# Patient Record
Sex: Female | Born: 1990
Health system: Southern US, Community
[De-identification: ages and names within clinical notes are randomized; demographics above are authoritative.]

## PROBLEM LIST (undated history)

## (undated) ENCOUNTER — Inpatient Hospital Stay (HOSPITAL_COMMUNITY): Payer: Self-pay

## (undated) DIAGNOSIS — D649 Anemia, unspecified: Secondary | ICD-10-CM

## (undated) DIAGNOSIS — I82409 Acute embolism and thrombosis of unspecified deep veins of unspecified lower extremity: Secondary | ICD-10-CM

## (undated) HISTORY — PX: NO PAST SURGERIES: SHX2092

## (undated) HISTORY — DX: Anemia, unspecified: D64.9

## (undated) NOTE — *Deleted (*Deleted)
Meet the Provider Zoom Sessions      [facility] for [facility] is now offering FREE monthly 1-hour virtual Zoom sessions for new, current, and prospective patients.        During these sessions, you can:   Learn about our practice, model of care, services   Get answers to questions about pregnancy and birth during COVID   Pick your provider's brain about anything else!    Sessions will be hosted by [employer] for [employer], Producer, television/film/video, Physicians and Midwives          No registration required      2021 Dates:      All at 6pm     October 21st     November 18th   December 16th     January 20th  February 17th    To join one of these meetings, a few minutes before it is set to start:     Copy/paste the link into your web browser:  [email or web address]    OR  Scan the QR code below (open up your camera and point towards QR code; click on tab that pops up on your phone ("zoom")     Labor Precautions Reasons to come to [facility] at [facility]:  1.  Contractions are  5 minutes apart or less, each last 1 minute, these have been going on for 1-2 hours, and you cannot walk or talk during them 2.  You have a large gush of fluid, or a trickle of fluid that will not stop and you have to wear a pad 3.  You have bleeding that is bright red, heavier than spotting--like menstrual bleeding (spotting can be normal in early labor or after a check of your cervix) 4.  You do not feel the baby moving like he/she normally does

---

## 2010-08-19 ENCOUNTER — Inpatient Hospital Stay (HOSPITAL_COMMUNITY): Admission: AD | Admit: 2010-08-19 | Discharge: 2010-08-19 | Payer: Self-pay | Admitting: Obstetrics & Gynecology

## 2010-08-19 ENCOUNTER — Ambulatory Visit: Payer: Self-pay | Admitting: Gynecology

## 2010-11-11 ENCOUNTER — Ambulatory Visit (HOSPITAL_COMMUNITY): Admission: RE | Admit: 2010-11-11 | Discharge: 2010-11-11 | Payer: Self-pay | Admitting: Obstetrics and Gynecology

## 2011-01-11 ENCOUNTER — Inpatient Hospital Stay (HOSPITAL_COMMUNITY)
Admission: AD | Admit: 2011-01-11 | Discharge: 2011-01-11 | Payer: Self-pay | Source: Home / Self Care | Attending: Obstetrics and Gynecology | Admitting: Obstetrics and Gynecology

## 2011-01-13 LAB — URINALYSIS, ROUTINE W REFLEX MICROSCOPIC
Bilirubin Urine: NEGATIVE
Hgb urine dipstick: NEGATIVE
Ketones, ur: NEGATIVE mg/dL
Nitrite: NEGATIVE
Protein, ur: NEGATIVE mg/dL
Specific Gravity, Urine: 1.02 (ref 1.005–1.030)
Urine Glucose, Fasting: NEGATIVE mg/dL
Urobilinogen, UA: 0.2 mg/dL (ref 0.0–1.0)
pH: 7.5 (ref 5.0–8.0)

## 2011-02-11 ENCOUNTER — Inpatient Hospital Stay (HOSPITAL_COMMUNITY)
Admission: AD | Admit: 2011-02-11 | Discharge: 2011-02-11 | Disposition: A | Discharge status: Home / Self Care | Payer: Medicaid Other | Source: Ambulatory Visit | Attending: Obstetrics and Gynecology | Admitting: Obstetrics and Gynecology

## 2011-02-11 DIAGNOSIS — R109 Unspecified abdominal pain: Secondary | ICD-10-CM

## 2011-02-11 DIAGNOSIS — A499 Bacterial infection, unspecified: Secondary | ICD-10-CM | POA: Insufficient documentation

## 2011-02-11 DIAGNOSIS — B9689 Other specified bacterial agents as the cause of diseases classified elsewhere: Secondary | ICD-10-CM

## 2011-02-11 DIAGNOSIS — N760 Acute vaginitis: Secondary | ICD-10-CM | POA: Insufficient documentation

## 2011-02-11 DIAGNOSIS — O2390 Unspecified genitourinary tract infection in pregnancy, unspecified trimester: Secondary | ICD-10-CM

## 2011-02-11 LAB — URINALYSIS, ROUTINE W REFLEX MICROSCOPIC
Bilirubin Urine: NEGATIVE
Hgb urine dipstick: NEGATIVE
Ketones, ur: 15 mg/dL — AB
Nitrite: NEGATIVE
Protein, ur: NEGATIVE mg/dL
Specific Gravity, Urine: 1.01 (ref 1.005–1.030)
Urine Glucose, Fasting: NEGATIVE mg/dL
Urobilinogen, UA: 0.2 mg/dL (ref 0.0–1.0)
pH: 6.5 (ref 5.0–8.0)

## 2011-02-11 LAB — WET PREP, GENITAL
Trich, Wet Prep: NONE SEEN
Yeast Wet Prep HPF POC: NONE SEEN

## 2011-02-12 LAB — GC/CHLAMYDIA PROBE AMP, GENITAL
Chlamydia, DNA Probe: NEGATIVE
GC Probe Amp, Genital: NEGATIVE

## 2011-02-13 LAB — STREP B DNA PROBE: Strep Group B Ag: NEGATIVE

## 2011-03-08 ENCOUNTER — Inpatient Hospital Stay (HOSPITAL_COMMUNITY): Payer: Medicaid Other

## 2011-03-08 ENCOUNTER — Inpatient Hospital Stay (HOSPITAL_COMMUNITY)
Admission: AD | Admit: 2011-03-08 | Discharge: 2011-03-08 | Disposition: A | Discharge status: Home / Self Care | Payer: Medicaid Other | Source: Ambulatory Visit | Attending: Family Medicine | Admitting: Family Medicine

## 2011-03-08 DIAGNOSIS — O368390 Maternal care for abnormalities of the fetal heart rate or rhythm, unspecified trimester, not applicable or unspecified: Secondary | ICD-10-CM | POA: Insufficient documentation

## 2011-03-09 ENCOUNTER — Inpatient Hospital Stay (HOSPITAL_COMMUNITY)
Admission: AD | Admit: 2011-03-09 | Discharge: 2011-03-11 | DRG: 775 | Disposition: A | Discharge status: Home / Self Care | Payer: Medicaid Other | Source: Ambulatory Visit | Attending: Obstetrics & Gynecology | Admitting: Obstetrics & Gynecology

## 2011-03-10 LAB — CBC
HCT: 36 % (ref 36.0–46.0)
Hemoglobin: 11.9 g/dL — ABNORMAL LOW (ref 12.0–15.0)
MCH: 28.7 pg (ref 26.0–34.0)
MCHC: 33.1 g/dL (ref 30.0–36.0)
MCV: 87 fL (ref 78.0–100.0)
Platelets: 167 10*3/uL (ref 150–400)
RBC: 4.14 MIL/uL (ref 3.87–5.11)
RDW: 13.6 % (ref 11.5–15.5)
WBC: 5.7 10*3/uL (ref 4.0–10.5)

## 2011-03-10 LAB — RPR: RPR Ser Ql: NONREACTIVE

## 2011-03-12 ENCOUNTER — Other Ambulatory Visit: Payer: Medicaid Other

## 2011-03-12 LAB — URINALYSIS, ROUTINE W REFLEX MICROSCOPIC
Glucose, UA: NEGATIVE mg/dL
Ketones, ur: >80 mg/dL — AB
Leukocytes, UA: NEGATIVE
Nitrite: NEGATIVE
Protein, ur: 30 mg/dL — AB
Specific Gravity, Urine: >1.03 — ABNORMAL HIGH (ref 1.005–1.030)
Urobilinogen, UA: 1 mg/dL (ref 0.0–1.0)
pH: 6 (ref 5.0–8.0)

## 2011-03-12 LAB — GC/CHLAMYDIA PROBE AMP, GENITAL
Chlamydia, DNA Probe: NEGATIVE
GC Probe Amp, Genital: POSITIVE — AB

## 2011-03-12 LAB — URINE MICROSCOPIC-ADD ON

## 2011-03-12 LAB — WET PREP, GENITAL
Trich, Wet Prep: NONE SEEN
Yeast Wet Prep HPF POC: NONE SEEN

## 2011-03-12 LAB — POCT PREGNANCY, URINE: Preg Test, Ur: POSITIVE

## 2012-11-10 ENCOUNTER — Emergency Department (HOSPITAL_COMMUNITY): Payer: Self-pay

## 2012-11-10 ENCOUNTER — Emergency Department (HOSPITAL_COMMUNITY)
Admission: EM | Admit: 2012-11-10 | Discharge: 2012-11-10 | Disposition: A | Discharge status: Home / Self Care | Payer: No Typology Code available for payment source | Attending: Emergency Medicine | Admitting: Emergency Medicine

## 2012-11-10 ENCOUNTER — Encounter (HOSPITAL_COMMUNITY): Payer: Self-pay

## 2012-11-10 DIAGNOSIS — F17200 Nicotine dependence, unspecified, uncomplicated: Secondary | ICD-10-CM | POA: Insufficient documentation

## 2012-11-10 DIAGNOSIS — M542 Cervicalgia: Secondary | ICD-10-CM | POA: Insufficient documentation

## 2012-11-10 DIAGNOSIS — IMO0002 Reserved for concepts with insufficient information to code with codable children: Secondary | ICD-10-CM | POA: Insufficient documentation

## 2012-11-10 DIAGNOSIS — S39012A Strain of muscle, fascia and tendon of lower back, initial encounter: Secondary | ICD-10-CM

## 2012-11-10 DIAGNOSIS — Y929 Unspecified place or not applicable: Secondary | ICD-10-CM | POA: Insufficient documentation

## 2012-11-10 DIAGNOSIS — Y939 Activity, unspecified: Secondary | ICD-10-CM | POA: Insufficient documentation

## 2012-11-10 MED ORDER — DIAZEPAM 5 MG PO TABS: 5.0000 mg | ORAL_TABLET | Freq: Four times a day (QID) | ORAL | Status: DC | PRN

## 2012-11-10 MED ORDER — IBUPROFEN 600 MG PO TABS: 600.0000 mg | ORAL_TABLET | Freq: Four times a day (QID) | ORAL | Status: DC | PRN

## 2012-11-10 MED ORDER — OXYCODONE-ACETAMINOPHEN 5-325 MG PO TABS: 1.0000 | ORAL_TABLET | Freq: Four times a day (QID) | ORAL | Status: DC | PRN

## 2012-11-10 NOTE — ED Notes (Signed)
Pt arrived via [facility] on LSB and c-collar intact 
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
## 2012-11-10 NOTE — ED Notes (Signed)
Myself and Ginnie, RN assisted Pickering, MD with the removal of LSB; Pickering, MD removed c-collar; pt sitting up in bed awaiting tests to be performed

## 2013-11-17 ENCOUNTER — Encounter (HOSPITAL_COMMUNITY): Payer: Self-pay | Admitting: Emergency Medicine

## 2013-11-17 ENCOUNTER — Emergency Department (HOSPITAL_COMMUNITY)
Admission: EM | Admit: 2013-11-17 | Discharge: 2013-11-18 | Disposition: A | Discharge status: Home / Self Care | Payer: Medicaid Other | Attending: Emergency Medicine | Admitting: Emergency Medicine

## 2013-11-17 DIAGNOSIS — R0989 Other specified symptoms and signs involving the circulatory and respiratory systems: Secondary | ICD-10-CM | POA: Insufficient documentation

## 2013-11-17 DIAGNOSIS — R0602 Shortness of breath: Secondary | ICD-10-CM | POA: Insufficient documentation

## 2013-11-17 DIAGNOSIS — T7840XA Allergy, unspecified, initial encounter: Secondary | ICD-10-CM

## 2013-11-17 DIAGNOSIS — R21 Rash and other nonspecific skin eruption: Secondary | ICD-10-CM | POA: Insufficient documentation

## 2013-11-17 DIAGNOSIS — Y929 Unspecified place or not applicable: Secondary | ICD-10-CM | POA: Insufficient documentation

## 2013-11-17 DIAGNOSIS — R1310 Dysphagia, unspecified: Secondary | ICD-10-CM | POA: Insufficient documentation

## 2013-11-17 DIAGNOSIS — R0609 Other forms of dyspnea: Secondary | ICD-10-CM | POA: Insufficient documentation

## 2013-11-17 DIAGNOSIS — R0789 Other chest pain: Secondary | ICD-10-CM | POA: Insufficient documentation

## 2013-11-17 DIAGNOSIS — T61771A Other fish poisoning, accidental (unintentional), initial encounter: Secondary | ICD-10-CM | POA: Insufficient documentation

## 2013-11-17 DIAGNOSIS — F17200 Nicotine dependence, unspecified, uncomplicated: Secondary | ICD-10-CM | POA: Insufficient documentation

## 2013-11-17 DIAGNOSIS — Y939 Activity, unspecified: Secondary | ICD-10-CM | POA: Insufficient documentation

## 2013-11-17 MED ORDER — DIPHENHYDRAMINE HCL 50 MG/ML IJ SOLN
25.0000 mg | Freq: Once | INTRAMUSCULAR | Status: AC
  Administered 2013-11-17: 25 mg via INTRAVENOUS
  Filled 2013-11-17: qty 1

## 2013-11-17 MED ORDER — SODIUM CHLORIDE 0.9 % IV BOLUS (SEPSIS)
1000.0000 mL | Freq: Once | INTRAVENOUS | Status: AC
  Administered 2013-11-17: 1000 mL via INTRAVENOUS

## 2013-11-17 MED ORDER — FAMOTIDINE IN NACL 20-0.9 MG/50ML-% IV SOLN
20.0000 mg | Freq: Once | INTRAVENOUS | Status: AC
  Administered 2013-11-17: 20 mg via INTRAVENOUS
  Filled 2013-11-17: qty 50

## 2013-11-17 NOTE — ED Provider Notes (Signed)
CSN: [id number]     Arrival date & time 11/17/13  2202 History  This chart was scribed for non-physician practitioner Antony Madura, PA-C, working with Olivia Mackie, MD by Dorothey Baseman, ED Scribe. This patient was seen in room B15C/B15C and the patient's care was started at 11:17 PM.    Chief Complaint  Patient presents with  . Allergic Reaction   Patient is a 22 y.o. female presenting with allergic reaction. The history is provided by the patient. No language interpreter was used.  Allergic Reaction Presenting symptoms: difficulty breathing, difficulty swallowing and rash   Presenting symptoms: no wheezing   Severity:  Moderate Prior allergic episodes:  Seasonal allergies and food/nut allergies Context: food   Relieved by:  None tried Ineffective treatments:  None tried  HPI Comments: Claudia Miller is a 22 y.o. female who presents to the Emergency Department complaining of an allergic reaction onset PTA when she states that she ate a microwaved eggroll containing shrimp around 3 hours ago. Patient reports that she has eaten shrimp before without an allergic reaction. She reports associated throat tightness/scratchiness, shortness of breath that has been improving, difficulty swallowing, chest tightness, and an urticarial rash. She denies taking any medications at home to treat her symptoms. Patient denies drooling, wheezes, loss of consciousness, or facial swelling. She reports an allergy to fish and seasonal allergies.   History reviewed. No pertinent past medical history. History reviewed. No pertinent past surgical history. Family History  Problem Relation Age of Onset  . Asthma Mother   . Diabetes Mother   . Hypertension Mother   . Cancer Father   . Hypertension Father   . Asthma Sister   . Hypertension Sister   . Asthma Brother   . Hypertension Brother    History  Substance Use Topics  . Smoking status: Current Every Day Smoker -- 0.50 packs/day    Types: Cigarettes  .  Smokeless tobacco: Not on file  . Alcohol Use: No   OB History   Grav Para Term Preterm Abortions TAB SAB Ect Mult Living                 Review of Systems  HENT: Positive for trouble swallowing. Negative for drooling and facial swelling.   Respiratory: Positive for chest tightness and shortness of breath. Negative for wheezing.   Skin: Positive for rash.  Allergic/Immunologic: Positive for food allergies.  Neurological: Negative for syncope.    Allergies  Fish allergy  Home Medications   Current Outpatient Rx  Name  Route  Sig  Dispense  Refill  . aspirin-acetaminophen-caffeine (EXCEDRIN MIGRAINE) 250-250-65 MG per tablet   Oral   Take 1 tablet by mouth every 6 (six) hours as needed for headache or migraine.          Triage Vitals: BP 96/52  Pulse 78  Temp(Src) 98.2 F (36.8 C)  Resp 18  SpO2 99%  LMP 10/27/2013  Physical Exam  Nursing note and vitals reviewed. Constitutional: She is oriented to person, place, and time. She appears well-developed and well-nourished. No distress.  HENT:  Head: Normocephalic and atraumatic.  No angioedema. No drooling. Patient is tolerating secretions well.   Eyes: Conjunctivae and EOM are normal. No scleral icterus.  Neck: Normal range of motion.  Cardiovascular: Normal rate, regular rhythm and normal heart sounds.  Exam reveals no gallop and no friction rub.   No murmur heard. Not tachycardiac.   Pulmonary/Chest: Effort normal and breath sounds normal. No stridor. No respiratory  distress. She has no wheezes. She has no rales.  Lungs clear to auscultation bilaterally.  Musculoskeletal: Normal range of motion.  Neurological: She is alert and oriented to person, place, and time.  Skin: Skin is warm and dry. No rash noted. She is not diaphoretic. No erythema. No pallor.  Psychiatric: She has a normal mood and affect. Her behavior is normal.    ED Course  Procedures (including critical care time)  DIAGNOSTIC STUDIES: Oxygen  Saturation is 99% on room air, normal by my interpretation.    COORDINATION OF CARE: 11:26 PM- Will order Benadryl, Pepcid, and a sodium chloride bolus. Discussed treatment plan with patient at bedside and patient verbalized agreement.   Labs Review Labs Reviewed - No data to display Imaging Review No results found.  EKG Interpretation   None       MDM   1. Allergic reaction, initial encounter    22 year old female with a history of fish allergy presents for difficulty swallowing and a sensation that her throat was closing up after eating an eggroll this evening. Patient well and nontoxic appearing on arrival, hemodynamically stable, and afebrile. On initial presentation patient without tachycardia, tachypnea, dyspnea, or hypoxia. No angioedema or stridor noted on physical exam. Lungs CTAB. Patient treated in emergency department with Benadryl, Pepcid, and IV fluids with continued improvement in her symptoms. As patient has remained hemodynamically stable without stridor, angioedema, tachypnea, or hypoxia for at least 5 hours after food ingestion, believe her to be stable for discharge with primary care followup as needed. Patient given referral to allergist and instructed to refrain from eating seafood. Return precautions discussed and patient agreeable to plan with no unaddressed concerns.  I personally performed the services described in this documentation, which was scribed in my presence. The recorded information has been reviewed and is accurate.      Antony Madura, PA-C 11/18/13 [phone]

## 2013-11-17 NOTE — ED Notes (Signed)
Pt states she ate some shrimp, and now she feels like her throat is closing up.

## 2013-11-18 NOTE — ED Provider Notes (Signed)
Medical screening examination/treatment/procedure(s) were performed by non-physician practitioner and as supervising physician I was immediately available for consultation/collaboration.    Olivia Mackie, MD 11/18/13 [phone]

## 2013-12-01 ENCOUNTER — Encounter (HOSPITAL_COMMUNITY): Payer: Self-pay | Admitting: Emergency Medicine

## 2013-12-01 ENCOUNTER — Emergency Department (HOSPITAL_COMMUNITY)
Admission: EM | Admit: 2013-12-01 | Discharge: 2013-12-01 | Disposition: A | Discharge status: Home / Self Care | Payer: Medicaid Other | Attending: Emergency Medicine | Admitting: Emergency Medicine

## 2013-12-01 ENCOUNTER — Emergency Department (HOSPITAL_COMMUNITY)
Admission: EM | Admit: 2013-12-01 | Discharge: 2013-12-01 | Disposition: A | Discharge status: Nursing Home (Includes ICF) | Payer: Medicaid Other | Source: Home / Self Care | Attending: Emergency Medicine | Admitting: Emergency Medicine

## 2013-12-01 DIAGNOSIS — Z3490 Encounter for supervision of normal pregnancy, unspecified, unspecified trimester: Secondary | ICD-10-CM

## 2013-12-01 DIAGNOSIS — R2990 Unspecified symptoms and signs involving the nervous system: Secondary | ICD-10-CM

## 2013-12-01 DIAGNOSIS — F17200 Nicotine dependence, unspecified, uncomplicated: Secondary | ICD-10-CM | POA: Insufficient documentation

## 2013-12-01 DIAGNOSIS — R29818 Other symptoms and signs involving the nervous system: Secondary | ICD-10-CM

## 2013-12-01 DIAGNOSIS — G43909 Migraine, unspecified, not intractable, without status migrainosus: Secondary | ICD-10-CM | POA: Insufficient documentation

## 2013-12-01 DIAGNOSIS — R51 Headache: Secondary | ICD-10-CM

## 2013-12-01 DIAGNOSIS — O219 Vomiting of pregnancy, unspecified: Secondary | ICD-10-CM | POA: Insufficient documentation

## 2013-12-01 DIAGNOSIS — R197 Diarrhea, unspecified: Secondary | ICD-10-CM | POA: Insufficient documentation

## 2013-12-01 DIAGNOSIS — Z79899 Other long term (current) drug therapy: Secondary | ICD-10-CM | POA: Insufficient documentation

## 2013-12-01 DIAGNOSIS — Z3201 Encounter for pregnancy test, result positive: Secondary | ICD-10-CM | POA: Insufficient documentation

## 2013-12-01 DIAGNOSIS — R112 Nausea with vomiting, unspecified: Secondary | ICD-10-CM

## 2013-12-01 DIAGNOSIS — R519 Headache, unspecified: Secondary | ICD-10-CM

## 2013-12-01 LAB — URINALYSIS, ROUTINE W REFLEX MICROSCOPIC
Bilirubin Urine: NEGATIVE
Glucose, UA: NEGATIVE mg/dL
Ketones, ur: NEGATIVE mg/dL
Nitrite: NEGATIVE
Protein, ur: NEGATIVE mg/dL
Specific Gravity, Urine: 1.015 (ref 1.005–1.030)
Urobilinogen, UA: 0.2 mg/dL (ref 0.0–1.0)
pH: 6.5 (ref 5.0–8.0)

## 2013-12-01 LAB — URINE MICROSCOPIC-ADD ON

## 2013-12-01 MED ORDER — METOCLOPRAMIDE HCL 5 MG/ML IJ SOLN
10.0000 mg | Freq: Once | INTRAMUSCULAR | Status: AC
  Administered 2013-12-01: 10 mg via INTRAVENOUS
  Filled 2013-12-01: qty 2

## 2013-12-01 MED ORDER — NITROFURANTOIN MONOHYD MACRO 100 MG PO CAPS: 100.0000 mg | ORAL_CAPSULE | Freq: Two times a day (BID) | ORAL | Status: DC

## 2013-12-01 MED ORDER — DIPHENHYDRAMINE HCL 50 MG/ML IJ SOLN
25.0000 mg | Freq: Once | INTRAMUSCULAR | Status: AC
  Administered 2013-12-01: 25 mg via INTRAVENOUS
  Filled 2013-12-01: qty 1

## 2013-12-01 MED ORDER — ONDANSETRON 4 MG PO TBDP
ORAL_TABLET | ORAL | Status: AC
  Filled 2013-12-01: qty 2

## 2013-12-01 MED ORDER — ONDANSETRON 4 MG PO TBDP: 4.0000 mg | ORAL_TABLET | Freq: Three times a day (TID) | ORAL | Status: DC | PRN

## 2013-12-01 MED ORDER — ONDANSETRON 4 MG PO TBDP
8.0000 mg | ORAL_TABLET | Freq: Once | ORAL | Status: AC
  Administered 2013-12-01: 8 mg via ORAL

## 2013-12-01 MED ORDER — DEXAMETHASONE SODIUM PHOSPHATE 10 MG/ML IJ SOLN
10.0000 mg | Freq: Once | INTRAMUSCULAR | Status: AC
  Administered 2013-12-01: 10 mg via INTRAVENOUS
  Filled 2013-12-01: qty 1

## 2013-12-01 NOTE — ED Notes (Signed)
Pharmacy tech at bedside 

## 2013-12-01 NOTE — ED Provider Notes (Signed)
CSN: [id number]     Arrival date & time 12/01/13  1213 History   First MD Initiated Contact with Patient 12/01/13 1228     Chief Complaint  Patient presents with  . Nausea  . Diarrhea   (Consider location/radiation/quality/duration/timing/severity/associated sxs/prior Treatment) HPI Comments: Patient presents with a chief complaint of nausea, vomiting, diarrhea, and headache.  She reports that her symptoms have been present for the past 3 days.  She reports three episodes of diarrhea and three episodes of vomiting.  She denies any blood in her emesis or blood in her stool.  She denies abdominal pain.  Denies fever or chills.  Denies urinary symptoms.  She has not taken anything for her symptoms prior to arrival.  She reports that her last menstrual period was approximately one month ago.  She denies vaginal discharge or vaginal bleeding.  Patient also complaining of a frontal headache that has been present intermittently over the past 3 days.  She reports that the headache is gradual in onset.  She has had similar headaches in the past.  She denies any acute head injury or trauma.  Denies neck pain, neck stiffness, fever, chills, or vision changes.    The history is provided by the patient.    History reviewed. No pertinent past medical history. History reviewed. No pertinent past surgical history. Family History  Problem Relation Age of Onset  . Asthma Mother   . Diabetes Mother   . Hypertension Mother   . Cancer Father   . Hypertension Father   . Asthma Sister   . Hypertension Sister   . Asthma Brother   . Hypertension Brother    History  Substance Use Topics  . Smoking status: Current Every Day Smoker -- 0.50 packs/day    Types: Cigarettes  . Smokeless tobacco: Not on file  . Alcohol Use: No   OB History   Grav Para Term Preterm Abortions TAB SAB Ect Mult Living                 Review of Systems  Gastrointestinal: Positive for nausea, vomiting and diarrhea.   Neurological: Positive for headaches.  All other systems reviewed and are negative.    Allergies  Fish allergy  Home Medications   Current Outpatient Rx  Name  Route  Sig  Dispense  Refill  . aspirin-acetaminophen-caffeine (EXCEDRIN MIGRAINE) 250-250-65 MG per tablet   Oral   Take 1 tablet by mouth every 6 (six) hours as needed for headache or migraine.          BP 103/77  Pulse 72  Temp(Src) 98.2 F (36.8 C) (Oral)  Resp 18  Ht 5' (1.524 m)  Wt 180 lb (81.647 kg)  BMI 35.15 kg/m2  SpO2 72%  LMP 11/07/2013 Physical Exam  Nursing note and vitals reviewed. Constitutional: She appears well-developed and well-nourished.  HENT:  Head: Normocephalic and atraumatic.  Mouth/Throat: Oropharynx is clear and moist.  Eyes: EOM are normal. Pupils are equal, round, and reactive to light.  Neck: Normal range of motion. Neck supple.  Cardiovascular: Normal rate, regular rhythm and normal heart sounds.   Pulmonary/Chest: Effort normal and breath sounds normal.  Abdominal: Soft. Bowel sounds are normal. She exhibits no distension and no mass. There is no tenderness. There is no rebound and no guarding.  Musculoskeletal: Normal range of motion.  Neurological: She is alert. She has normal strength. No cranial nerve deficit or sensory deficit. Coordination and gait normal.  Normal finger to nose testing bilaterally  Normal rapid alternating movements Normal gait, no ataxia.    Skin: Skin is warm and dry. No rash noted.  Psychiatric: She has a normal mood and affect.    ED Course  Procedures (including critical care time) Labs Review Labs Reviewed  URINALYSIS, ROUTINE W REFLEX MICROSCOPIC   Imaging Review No results found.  EKG Interpretation   None       MDM  No diagnosis found. Pt HA treated and improved while in ED.  Presentation is like pts typical HA and non concerning for [facility], ICH, Meningitis, or temporal arteritis. Pt is afebrile with no focal neuro deficits,  nuchal rigidity, or change in vision. Pt is to follow up with PCP to discuss prophylactic medication. Pt verbalizes understanding and is agreeable with plan to dc.  Patient also complaining of nausea, vomiting, and diarrhea.  Nausea improved after given Zofran.  Patient able to tolerate PO liquids.  No abdominal pain on exam.  Urine pregnancy positive, but patient does not have any pelvic pain, vaginal discharge, or vaginal bleeding.  Therefore, do not feel that any imaging is indicated at this time.  Patient stable for discharge.  Return precautions given.   Santiago Glad, PA-C 12/02/13 2201

## 2013-12-01 NOTE — ED Notes (Signed)
Pt c/o constant HA onset 3 days Sxs also include: nauseas, diarrhea, dizziness... HA will increases w/bright light and loud noise Denies: colds sxs, f/v Taking Excedrin w/no relief.  Alert w/no signs of acute distress.

## 2013-12-01 NOTE — ED Provider Notes (Signed)
Medical screening examination/treatment/procedure(s) were performed by non-physician practitioner and as supervising physician I was immediately available for consultation/collaboration.  Leslee Home, M.D.  Reuben Likes, MD 12/01/13 [phone]

## 2013-12-01 NOTE — ED Notes (Signed)
Pt. Stated. Ive had N/D and a headache for the last 3 days.

## 2013-12-01 NOTE — ED Notes (Signed)
Pt reports she was just seen at urgent care and was given zofran ODT and it has helped her nausea since then.

## 2013-12-01 NOTE — ED Notes (Signed)
Pt given urine specimen cup and told to provide [place] a urine sample.

## 2013-12-01 NOTE — ED Provider Notes (Signed)
CSN: [id number]     Arrival date & time 12/01/13  1009 History   First MD Initiated Contact with Patient 12/01/13 1136     Chief Complaint  Patient presents with  . Headache   (Consider location/radiation/quality/duration/timing/severity/associated sxs/prior Treatment) HPI Comments: 22 year old female presents complaining of headache, nausea, vomiting, diarrhea, lightheadedness. She also admits to a rash on the right side of her neck. This began 3 days ago with a mild to moderate left-sided headache. Since that time, the headache has gotten progressively worse. It is not consistently in one spot, she states it moves around her left side of her head. She has not had any time without headache in the last 3 days. Initially, the headache did not have any other symptoms but has progressed to photophobia, phonophobia. Starting yesterday, she began to have nausea which progressed to vomiting as well as diarrhea. Yesterday, she also noticed a rash on the right side of her neck. Today, she has not had any vomiting or diarrhea but she is still extremely nauseous. The headache is rated currently 8/10 in severity, but occasionally goes to 10 out of 10. She has one sick contact at home, stating that her daughter also vomited this morning. She denies fever, chills, abdominal pain, neck stiffness. She has no history of migraines and no history of severe prolonged headaches.  Patient is a 22 y.o. female presenting with headaches.  Headache Associated symptoms: diarrhea, dizziness, nausea, photophobia and vomiting   Associated symptoms: no abdominal pain, no back pain, no cough, no fever, no myalgias, no neck pain, no neck stiffness, no numbness and no seizures     History reviewed. No pertinent past medical history. History reviewed. No pertinent past surgical history. Family History  Problem Relation Age of Onset  . Asthma Mother   . Diabetes Mother   . Hypertension Mother   . Cancer Father   . Hypertension  Father   . Asthma Sister   . Hypertension Sister   . Asthma Brother   . Hypertension Brother    History  Substance Use Topics  . Smoking status: Current Every Day Smoker -- 0.50 packs/day    Types: Cigarettes  . Smokeless tobacco: Not on file  . Alcohol Use: No   OB History   Grav Para Term Preterm Abortions TAB SAB Ect Mult Living                 Review of Systems  Constitutional: Negative for fever and chills.  Eyes: Positive for photophobia. Negative for visual disturbance.  Respiratory: Negative for cough and shortness of breath.   Cardiovascular: Negative for chest pain, palpitations and leg swelling.  Gastrointestinal: Positive for nausea, vomiting and diarrhea. Negative for abdominal pain and blood in stool.  Endocrine: Negative for polydipsia and polyuria.  Genitourinary: Negative for dysuria, urgency and frequency.  Musculoskeletal: Negative for arthralgias, back pain, myalgias, neck pain and neck stiffness.  Skin: Positive for rash.  Neurological: Positive for dizziness, light-headedness and headaches. Negative for seizures, syncope, speech difficulty, weakness and numbness.    Allergies  Fish allergy  Home Medications   Current Outpatient Rx  Name  Route  Sig  Dispense  Refill  . aspirin-acetaminophen-caffeine (EXCEDRIN MIGRAINE) 250-250-65 MG per tablet   Oral   Take 1 tablet by mouth every 6 (six) hours as needed for headache or migraine.          BP 105/57  Pulse 80  Temp(Src) 98.1 F (36.7 C) (Oral)  Resp 16  SpO2 100%  LMP 11/07/2013 Physical Exam  Nursing note and vitals reviewed. Constitutional: She is oriented to person, place, and time. Vital signs are normal. She appears well-developed and well-nourished. No distress.  HENT:  Head: Normocephalic and atraumatic.  Eyes: Conjunctivae are normal. Pupils are equal, round, and reactive to light. Right eye exhibits abnormal extraocular motion (pain and weakness with upper gaze, inability to hold  upward gaze). Right eye exhibits no nystagmus. Left eye exhibits abnormal extraocular motion (pain and weakness with upper gaze, inability to hold upward gaze). Left eye exhibits no nystagmus.  Neck: Normal range of motion. Neck supple. No JVD present.  Cardiovascular: Normal rate, regular rhythm and normal heart sounds.   Pulmonary/Chest: Effort normal and breath sounds normal. No respiratory distress. She has no wheezes. She has no rales.  Abdominal: Soft. There is no tenderness. There is no rebound and no guarding.  Lymphadenopathy:    She has no cervical adenopathy.  Neurological: She is alert and oriented to person, place, and time. She has normal strength. A cranial nerve deficit (Pain and weakness of the extraocular movements with inability to hold upward gaze) is present. No sensory deficit. Coordination normal.  Skin: Skin is warm and dry. Rash (Fine papular rash on the right side of the neck) noted. She is not diaphoretic.  Psychiatric: She has a normal mood and affect. Judgment normal.    ED Course  Procedures (including critical care time) Labs Review Labs Reviewed - No data to display Imaging Review No results found.    MDM   1. Headache   2. Abnormal neurological exam    This may be a first migraine and she has also gotten sick with gastroenteritis, but given the mild abnormality on the neurologic exam, rash, and worsening of symptoms, she needs more evaluation to rule out intracranial process or CNS infection. Transferred to ED for further evaluation. Given 8 mg of Zofran ODT at 12 PM    Graylon Good, PA-C 12/01/13 1210  Graylon Good, PA-C 12/01/13 1210

## 2013-12-01 NOTE — ED Notes (Signed)
Pt. Stated, I've threw up 3 times in the last 3 days and I've had 2 diarrheas.

## 2013-12-01 NOTE — ED Notes (Signed)
Pt c/o headache X 3 days, n/v/d since last night. sts she tried taking Excedrin migraine, no relief in pain. Denies bld in vomit and stool. sts she is having lower abd pain after vomiting but now the pain is gone. Denies urinary symptoms. Denies eating anything abnormal last night. Nad, skin warm and dry, resp e/u. sts she does get HAs often but can usually take excedrin have get relief.

## 2013-12-03 NOTE — ED Provider Notes (Signed)
Medical screening examination/treatment/procedure(s) were performed by non-physician practitioner and as supervising physician I was immediately available for consultation/collaboration.  EKG Interpretation   None        Doug Sou, MD 12/03/13 1502

## 2013-12-27 NOTE — L&D Delivery Note (Signed)
Delivery Note  G2P1001 3776w4d, GBS Negative (07/20 0000), hx of trich, meconium  At 9:35 AM a viable female was delivered via Vaginal, Spontaneous Delivery (Presentation: Left Occiput Anterior).  APGAR: 9, 9; weight .   Placenta status: Intact, Spontaneous.  Cord: 3 vessels with the following complications: None.  Anesthesia: Epidural  Episiotomy: None Lacerations: None Suture Repair: none Est. Blood Loss (mL): 250mL  Mom to postpartum.  Baby to Couplet care / Skin to Skin.  Mirtie Bastyr ROCIO 07/27/2014, 9:51 AM

## 2014-01-10 ENCOUNTER — Other Ambulatory Visit (HOSPITAL_COMMUNITY): Payer: Self-pay | Admitting: Physician Assistant

## 2014-01-10 DIAGNOSIS — Z3682 Encounter for antenatal screening for nuchal translucency: Secondary | ICD-10-CM

## 2014-01-10 LAB — OB RESULTS CONSOLE GC/CHLAMYDIA
Chlamydia: NEGATIVE
GC PROBE AMP, GENITAL: NEGATIVE

## 2014-01-10 LAB — OB RESULTS CONSOLE ANTIBODY SCREEN: ANTIBODY SCREEN: NEGATIVE

## 2014-01-10 LAB — OB RESULTS CONSOLE RPR: RPR: NONREACTIVE

## 2014-01-10 LAB — OB RESULTS CONSOLE HEPATITIS B SURFACE ANTIGEN: HEP B S AG: NEGATIVE

## 2014-01-10 LAB — OB RESULTS CONSOLE RUBELLA ANTIBODY, IGM: RUBELLA: IMMUNE

## 2014-01-10 LAB — OB RESULTS CONSOLE ABO/RH: RH Type: POSITIVE

## 2014-01-10 LAB — OB RESULTS CONSOLE HIV ANTIBODY (ROUTINE TESTING): HIV: NONREACTIVE

## 2014-02-07 ENCOUNTER — Ambulatory Visit (HOSPITAL_COMMUNITY): Payer: Medicaid Other | Attending: Obstetrics

## 2014-02-07 ENCOUNTER — Ambulatory Visit (HOSPITAL_COMMUNITY): Admission: RE | Admit: 2014-02-07 | Payer: Medicaid Other | Source: Ambulatory Visit

## 2014-03-11 ENCOUNTER — Other Ambulatory Visit (HOSPITAL_COMMUNITY): Payer: Self-pay | Admitting: Physician Assistant

## 2014-03-11 DIAGNOSIS — Z3689 Encounter for other specified antenatal screening: Secondary | ICD-10-CM

## 2014-03-18 ENCOUNTER — Ambulatory Visit (HOSPITAL_COMMUNITY)
Admission: RE | Admit: 2014-03-18 | Discharge: 2014-03-18 | Disposition: A | Discharge status: Home / Self Care | Payer: Medicaid Other | Source: Ambulatory Visit | Attending: Physician Assistant | Admitting: Physician Assistant

## 2014-03-18 DIAGNOSIS — Z3689 Encounter for other specified antenatal screening: Secondary | ICD-10-CM | POA: Insufficient documentation

## 2014-07-15 LAB — OB RESULTS CONSOLE GBS: GBS: NEGATIVE

## 2014-07-26 ENCOUNTER — Inpatient Hospital Stay (HOSPITAL_COMMUNITY)
Admission: AD | Admit: 2014-07-26 | Discharge: 2014-07-28 | DRG: 775 | Disposition: A | Discharge status: Home / Self Care | Payer: Medicaid Other | Source: Ambulatory Visit | Attending: Family Medicine | Admitting: Family Medicine

## 2014-07-26 ENCOUNTER — Encounter (HOSPITAL_COMMUNITY): Payer: Self-pay | Admitting: *Deleted

## 2014-07-26 DIAGNOSIS — A5901 Trichomonal vulvovaginitis: Secondary | ICD-10-CM | POA: Clinically undetermined

## 2014-07-26 DIAGNOSIS — Z6837 Body mass index (BMI) 37.0-37.9, adult: Secondary | ICD-10-CM

## 2014-07-26 DIAGNOSIS — Z8249 Family history of ischemic heart disease and other diseases of the circulatory system: Secondary | ICD-10-CM

## 2014-07-26 DIAGNOSIS — O9902 Anemia complicating childbirth: Principal | ICD-10-CM | POA: Diagnosis present

## 2014-07-26 DIAGNOSIS — Z87891 Personal history of nicotine dependence: Secondary | ICD-10-CM

## 2014-07-26 DIAGNOSIS — O23599 Infection of other part of genital tract in pregnancy, unspecified trimester: Secondary | ICD-10-CM

## 2014-07-26 DIAGNOSIS — O99214 Obesity complicating childbirth: Secondary | ICD-10-CM

## 2014-07-26 DIAGNOSIS — Z833 Family history of diabetes mellitus: Secondary | ICD-10-CM

## 2014-07-26 DIAGNOSIS — D649 Anemia, unspecified: Secondary | ICD-10-CM | POA: Diagnosis present

## 2014-07-26 DIAGNOSIS — IMO0001 Reserved for inherently not codable concepts without codable children: Secondary | ICD-10-CM

## 2014-07-26 DIAGNOSIS — E669 Obesity, unspecified: Secondary | ICD-10-CM | POA: Diagnosis present

## 2014-07-26 DIAGNOSIS — Z825 Family history of asthma and other chronic lower respiratory diseases: Secondary | ICD-10-CM

## 2014-07-26 DIAGNOSIS — O0933 Supervision of pregnancy with insufficient antenatal care, third trimester: Secondary | ICD-10-CM

## 2014-07-26 NOTE — MAU Note (Signed)
Contractions since 1900. Alittle bit of bloody mucous earlier but denies leaking fld.

## 2014-07-27 ENCOUNTER — Encounter (HOSPITAL_COMMUNITY): Payer: Medicaid Other | Admitting: Anesthesiology

## 2014-07-27 ENCOUNTER — Inpatient Hospital Stay (HOSPITAL_COMMUNITY): Payer: Medicaid Other | Admitting: Anesthesiology

## 2014-07-27 ENCOUNTER — Encounter (HOSPITAL_COMMUNITY): Payer: Self-pay | Admitting: *Deleted

## 2014-07-27 DIAGNOSIS — Z8249 Family history of ischemic heart disease and other diseases of the circulatory system: Secondary | ICD-10-CM | POA: Diagnosis not present

## 2014-07-27 DIAGNOSIS — O0933 Supervision of pregnancy with insufficient antenatal care, third trimester: Secondary | ICD-10-CM

## 2014-07-27 DIAGNOSIS — O479 False labor, unspecified: Secondary | ICD-10-CM | POA: Diagnosis present

## 2014-07-27 DIAGNOSIS — Z87891 Personal history of nicotine dependence: Secondary | ICD-10-CM | POA: Diagnosis not present

## 2014-07-27 DIAGNOSIS — Z6837 Body mass index (BMI) 37.0-37.9, adult: Secondary | ICD-10-CM | POA: Diagnosis not present

## 2014-07-27 DIAGNOSIS — IMO0001 Reserved for inherently not codable concepts without codable children: Secondary | ICD-10-CM

## 2014-07-27 DIAGNOSIS — O23599 Infection of other part of genital tract in pregnancy, unspecified trimester: Secondary | ICD-10-CM

## 2014-07-27 DIAGNOSIS — A5901 Trichomonal vulvovaginitis: Secondary | ICD-10-CM | POA: Clinically undetermined

## 2014-07-27 DIAGNOSIS — E669 Obesity, unspecified: Secondary | ICD-10-CM

## 2014-07-27 DIAGNOSIS — Z825 Family history of asthma and other chronic lower respiratory diseases: Secondary | ICD-10-CM | POA: Diagnosis not present

## 2014-07-27 DIAGNOSIS — O99214 Obesity complicating childbirth: Secondary | ICD-10-CM

## 2014-07-27 DIAGNOSIS — D649 Anemia, unspecified: Secondary | ICD-10-CM

## 2014-07-27 DIAGNOSIS — Z833 Family history of diabetes mellitus: Secondary | ICD-10-CM | POA: Diagnosis not present

## 2014-07-27 DIAGNOSIS — O9902 Anemia complicating childbirth: Secondary | ICD-10-CM

## 2014-07-27 LAB — CBC
HEMATOCRIT: 34.3 % — AB (ref 36.0–46.0)
Hemoglobin: 11.6 g/dL — ABNORMAL LOW (ref 12.0–15.0)
MCH: 29.6 pg (ref 26.0–34.0)
MCHC: 33.8 g/dL (ref 30.0–36.0)
MCV: 87.5 fL (ref 78.0–100.0)
Platelets: 208 10*3/uL (ref 150–400)
RBC: 3.92 MIL/uL (ref 3.87–5.11)
RDW: 13.6 % (ref 11.5–15.5)
WBC: 7.6 10*3/uL (ref 4.0–10.5)

## 2014-07-27 LAB — RPR

## 2014-07-27 MED ORDER — OXYTOCIN BOLUS FROM INFUSION
500.0000 mL | INTRAVENOUS | Status: DC
  Administered 2014-07-27: 500 mL via INTRAVENOUS

## 2014-07-27 MED ORDER — EPHEDRINE 5 MG/ML INJ
10.0000 mg | INTRAVENOUS | Status: DC | PRN
  Filled 2014-07-27: qty 2

## 2014-07-27 MED ORDER — OXYCODONE-ACETAMINOPHEN 5-325 MG PO TABS: 1.0000 | ORAL_TABLET | ORAL | Status: DC | PRN

## 2014-07-27 MED ORDER — BENZOCAINE-MENTHOL 20-0.5 % EX AERO: 1.0000 "application " | INHALATION_SPRAY | CUTANEOUS | Status: DC | PRN

## 2014-07-27 MED ORDER — LACTATED RINGERS IV SOLN: 500.0000 mL | INTRAVENOUS | Status: DC | PRN

## 2014-07-27 MED ORDER — LACTATED RINGERS IV SOLN
INTRAVENOUS | Status: DC
  Administered 2014-07-27: 04:00:00 via INTRAVENOUS
  Administered 2014-07-27: 1000 mL via INTRAVENOUS

## 2014-07-27 MED ORDER — TETANUS-DIPHTH-ACELL PERTUSSIS 5-2.5-18.5 LF-MCG/0.5 IM SUSP
0.5000 mL | Freq: Once | INTRAMUSCULAR | Status: DC
Start: 2014-07-28 — End: 2014-07-28

## 2014-07-27 MED ORDER — DIPHENHYDRAMINE HCL 25 MG PO CAPS: 25.0000 mg | ORAL_CAPSULE | Freq: Four times a day (QID) | ORAL | Status: DC | PRN

## 2014-07-27 MED ORDER — SIMETHICONE 80 MG PO CHEW
80.0000 mg | CHEWABLE_TABLET | ORAL | Status: DC | PRN
Start: 2014-07-27 — End: 2014-07-28

## 2014-07-27 MED ORDER — ACETAMINOPHEN 325 MG PO TABS: 650.0000 mg | ORAL_TABLET | ORAL | Status: DC | PRN

## 2014-07-27 MED ORDER — SENNOSIDES-DOCUSATE SODIUM 8.6-50 MG PO TABS
2.0000 | ORAL_TABLET | ORAL | Status: DC
  Administered 2014-07-27: 2 via ORAL
  Filled 2014-07-27: qty 2

## 2014-07-27 MED ORDER — ZOLPIDEM TARTRATE 5 MG PO TABS: 5.0000 mg | ORAL_TABLET | Freq: Every evening | ORAL | Status: DC | PRN

## 2014-07-27 MED ORDER — LACTATED RINGERS IV SOLN
500.0000 mL | Freq: Once | INTRAVENOUS | Status: AC
  Administered 2014-07-27: 500 mL via INTRAVENOUS

## 2014-07-27 MED ORDER — FLEET ENEMA 7-19 GM/118ML RE ENEM: 1.0000 | ENEMA | RECTAL | Status: DC | PRN

## 2014-07-27 MED ORDER — LIDOCAINE HCL (PF) 1 % IJ SOLN
INTRAMUSCULAR | Status: AC
  Filled 2014-07-27: qty 30

## 2014-07-27 MED ORDER — WITCH HAZEL-GLYCERIN EX PADS: 1.0000 "application " | MEDICATED_PAD | CUTANEOUS | Status: DC | PRN

## 2014-07-27 MED ORDER — DIBUCAINE 1 % RE OINT
1.0000 | TOPICAL_OINTMENT | RECTAL | Status: DC | PRN
Start: 2014-07-27 — End: 2014-07-28

## 2014-07-27 MED ORDER — DIPHENHYDRAMINE HCL 50 MG/ML IJ SOLN: 12.5000 mg | INTRAMUSCULAR | Status: DC | PRN

## 2014-07-27 MED ORDER — OXYCODONE-ACETAMINOPHEN 5-325 MG PO TABS
1.0000 | ORAL_TABLET | ORAL | Status: DC | PRN
  Administered 2014-07-27: 1 via ORAL
  Filled 2014-07-27: qty 1

## 2014-07-27 MED ORDER — FENTANYL 2.5 MCG/ML BUPIVACAINE 1/10 % EPIDURAL INFUSION (WH - ANES)
14.0000 mL/h | INTRAMUSCULAR | Status: DC | PRN
  Administered 2014-07-27: 14 mL/h via EPIDURAL
  Filled 2014-07-27: qty 125

## 2014-07-27 MED ORDER — LIDOCAINE HCL (PF) 1 % IJ SOLN
30.0000 mL | INTRAMUSCULAR | Status: AC | PRN
  Administered 2014-07-27 (×2): 5 mL via SUBCUTANEOUS

## 2014-07-27 MED ORDER — CITRIC ACID-SODIUM CITRATE 334-500 MG/5ML PO SOLN: 30.0000 mL | ORAL | Status: DC | PRN

## 2014-07-27 MED ORDER — LACTATED RINGERS IV SOLN: INTRAVENOUS | Status: DC

## 2014-07-27 MED ORDER — ONDANSETRON HCL 4 MG PO TABS: 4.0000 mg | ORAL_TABLET | ORAL | Status: DC | PRN

## 2014-07-27 MED ORDER — PHENYLEPHRINE 40 MCG/ML (10ML) SYRINGE FOR IV PUSH (FOR BLOOD PRESSURE SUPPORT)
80.0000 ug | PREFILLED_SYRINGE | INTRAVENOUS | Status: DC | PRN
  Filled 2014-07-27: qty 10
  Filled 2014-07-27: qty 2

## 2014-07-27 MED ORDER — PHENYLEPHRINE 40 MCG/ML (10ML) SYRINGE FOR IV PUSH (FOR BLOOD PRESSURE SUPPORT)
80.0000 ug | PREFILLED_SYRINGE | INTRAVENOUS | Status: DC | PRN
  Filled 2014-07-27: qty 2

## 2014-07-27 MED ORDER — IBUPROFEN 600 MG PO TABS: 600.0000 mg | ORAL_TABLET | Freq: Four times a day (QID) | ORAL | Status: DC | PRN

## 2014-07-27 MED ORDER — ONDANSETRON HCL 4 MG/2ML IJ SOLN: 4.0000 mg | Freq: Four times a day (QID) | INTRAMUSCULAR | Status: DC | PRN

## 2014-07-27 MED ORDER — OXYTOCIN 40 UNITS IN LACTATED RINGERS INFUSION - SIMPLE MED: 62.5000 mL/h | INTRAVENOUS | Status: DC

## 2014-07-27 MED ORDER — ONDANSETRON HCL 4 MG/2ML IJ SOLN: 4.0000 mg | INTRAMUSCULAR | Status: DC | PRN

## 2014-07-27 MED ORDER — LANOLIN HYDROUS EX OINT: TOPICAL_OINTMENT | CUTANEOUS | Status: DC | PRN

## 2014-07-27 MED ORDER — IBUPROFEN 600 MG PO TABS
600.0000 mg | ORAL_TABLET | Freq: Four times a day (QID) | ORAL | Status: DC
  Administered 2014-07-27 – 2014-07-28 (×4): 600 mg via ORAL
  Filled 2014-07-27 (×4): qty 1

## 2014-07-27 MED ORDER — OXYTOCIN 40 UNITS IN LACTATED RINGERS INFUSION - SIMPLE MED
62.5000 mL/h | INTRAVENOUS | Status: DC
Start: 2014-07-27 — End: 2014-07-27
  Filled 2014-07-27: qty 1000

## 2014-07-27 MED ORDER — LIDOCAINE HCL (PF) 1 % IJ SOLN
30.0000 mL | INTRAMUSCULAR | Status: DC | PRN
  Filled 2014-07-27: qty 30

## 2014-07-27 MED ORDER — OXYTOCIN BOLUS FROM INFUSION: 500.0000 mL | INTRAVENOUS | Status: DC

## 2014-07-27 NOTE — H&P (Signed)
Claudia Miller is a 23 y.o. female G2P1001 at 4562w4d gestation presenting for contractions. Maternal Medical History:  Reason for admission: Nausea.     [facility]. She noticed contractions beginning last night at 1900 hours. Since that time, contractions have become more frequent and intense. She has noticed very minimal vaginal spotting. No gush of fluid. Normal fetal movement. She has no fevers, no other complaints. Pregnancy has been uncomplicated. Initially she was checked and was 4cm dilated. After an hour of ambulation, she was 5cm and noticed increasing contraction frequency every 2-3 minutes.  OB History   Grav Para Term Preterm Abortions TAB SAB Ect Mult Living   2 1 1       1      Past Medical History  Diagnosis Date  . Medical history non-contributory    Past Surgical History  Procedure Laterality Date  . No past surgeries     Family History: family history includes Asthma in her brother, mother, and sister; Cancer in her father; Diabetes in her mother; Hypertension in her brother, father, mother, and sister. Social History:  reports that she has quit smoking. Her smoking use included Cigarettes. She smoked 0.50 packs per day. She has never used smokeless tobacco. She reports that she does not drink alcohol or use illicit drugs.   Prenatal Transfer Tool  Maternal Diabetes: No Genetic Screening: Declined Maternal Ultrasounds/Referrals: Normal Fetal Ultrasounds or other Referrals:  None Maternal Substance Abuse:  No Significant Maternal Medications:  None Significant Maternal Lab Results:  None Other Comments:  None  Review of Systems  Constitutional: Negative for fever and chills.  HENT: Negative for hearing loss.   Eyes: Negative for blurred vision and double vision.  Respiratory: Negative for cough and shortness of breath.   Cardiovascular: Negative for chest pain and orthopnea.  Gastrointestinal: Negative for  heartburn, nausea, vomiting, abdominal pain, diarrhea and constipation.  Genitourinary: Negative for dysuria.  Musculoskeletal: Negative for myalgias.  Skin: Negative for itching and rash.  Neurological: Negative for dizziness and headaches.    Dilation: 5 Effacement (%): 80 Station: -2 Exam by:: Benji StanleyRN/Kathy Wilson RN Blood pressure 125/71, pulse 104, temperature 98.8 F (37.1 C), resp. rate 18, height 5\' 1"  (1.549 m), weight 90.719 kg (200 lb), last menstrual period 11/07/2013. Exam Physical Exam  Constitutional: She is oriented to person, place, and time. She appears well-developed and well-nourished.  23 year old female, sitting up in bed in no acute distress  Cardiovascular: Normal rate and regular rhythm.   Respiratory: Effort normal and breath sounds normal.  GI: Soft. Bowel sounds are normal. There is no tenderness.  Genitourinary: Vagina normal.  Musculoskeletal: Normal range of motion. She exhibits no edema and no tenderness.  Neurological: She is alert and oriented to person, place, and time.  Skin: Skin is warm and dry.    Prenatal labs: ABO, Rh: A/Positive/-- (01/15 0000) Antibody: Negative (01/15 0000) Rubella: Immune (01/15 0000) RPR: Nonreactive (01/15 0000)  HBsAg: Negative (01/15 0000)  HIV: Non-reactive (01/15 0000)  GBS: Negative (07/20 0000)   Assessment/Plan: 23 year old G2P1001 at 4062w4d gestation who presents with contractions, appears to be in early labor.  #Labor- Await further dilation for now. Admit to L&D. Continuous fetal monitoring. Allow patient to ambulate as long as strip reassuring. Check CBC, RPR #Anesthesia- She desires epidural when pain increases #GBS Negative #Bottle feeding # Desires post-partum tubal ligation.  Patient evaluated with Wynelle BourgeoisMarie Williams.  Markus JarvisStephens, Devin A 07/27/2014, 2:10  AM  Seen also by me Agree with note Aviva Signs, CNM

## 2014-07-27 NOTE — Anesthesia Postprocedure Evaluation (Signed)
  Anesthesia Post-op Note  Patient: Claudia Miller  Procedure(s) Performed: * No procedures listed *  Patient Location: Mother/Baby  Anesthesia Type:Epidural  Level of Consciousness: awake  Airway and Oxygen Therapy: Patient Spontanous Breathing  Post-op Pain: mild  Post-op Assessment: Patient's Cardiovascular Status Stable and Respiratory Function Stable  Post-op Vital Signs: stable  Last Vitals:  Filed Vitals:   07/27/14 1200  BP: 113/55  Pulse: 72  Temp: 37.2 C  Resp: 18    Complications: No apparent anesthesia complications

## 2014-07-27 NOTE — Anesthesia Preprocedure Evaluation (Signed)
Anesthesia Evaluation  Patient identified by MRN, date of birth, ID band Patient awake    Reviewed: Allergy & Precautions, H&P , NPO status , Patient's Chart, lab work & pertinent test results  History of Anesthesia Complications Negative for: history of anesthetic complications  Airway Mallampati: III TM Distance: >3 FB Neck ROM: Full    Dental   Pulmonary neg shortness of breath, neg sleep apnea, neg COPDneg recent URI, former smoker,          Cardiovascular negative cardio ROS  Rhythm:Regular     Neuro/Psych negative neurological ROS  negative psych ROS   GI/Hepatic negative GI ROS, Neg liver ROS,   Endo/Other  Morbid obesity  Renal/GU negative Renal ROS     Musculoskeletal   Abdominal   Peds  Hematology  (+) anemia ,   Anesthesia Other Findings   Reproductive/Obstetrics                           Anesthesia Physical Anesthesia Plan  ASA: II  Anesthesia Plan: Epidural   Post-op Pain Management:    Induction:   Airway Management Planned:   Additional Equipment:   Intra-op Plan:   Post-operative Plan:   Informed Consent: I have reviewed the patients History and Physical, chart, labs and discussed the procedure including the risks, benefits and alternatives for the proposed anesthesia with the patient or authorized representative who has indicated his/her understanding and acceptance.   Dental advisory given  Plan Discussed with: Anesthesiologist  Anesthesia Plan Comments:         Anesthesia Quick Evaluation

## 2014-07-27 NOTE — H&P (Signed)
Attestation of Attending Supervision of Advanced Practitioner (PA/CNM/NP): Evaluation and management procedures were performed by the Advanced Practitioner under my supervision and collaboration.  I have reviewed the Advanced Practitioner's note and chart, and I agree with the management and plan.  Reva BoresPRATT,Aino Heckert S, MD [facility] Attending 07/27/2014 7:11 AM

## 2014-07-27 NOTE — Anesthesia Procedure Notes (Signed)
Epidural Patient location during procedure: OB Start time: 07/27/2014 3:19 AM End time: 07/27/2014 3:31 AM  Staffing Anesthesiologist: Alahni Varone, CHRIS Performed by: anesthesiologist   Preanesthetic Checklist Completed: patient identified, surgical consent, pre-op evaluation, timeout performed, IV checked, risks and benefits discussed and monitors and equipment checked  Epidural Patient position: sitting Prep: site prepped and draped and DuraPrep Patient monitoring: heart rate, cardiac monitor, continuous pulse ox and blood pressure Approach: midline Location: L4-L5 Injection technique: LOR saline  Needle:  Needle type: Tuohy  Needle gauge: 17 G Needle length: 9 cm Needle insertion depth: 8 cm Catheter type: closed end flexible Catheter size: 19 Gauge Catheter at skin depth: 14 cm Test dose: Other  Assessment Events: blood not aspirated, injection not painful, no injection resistance, negative IV test and no paresthesia  Additional Notes H+P and labs checked, risks and benefits discussed with the patient, consent obtained, procedure tolerated well and without complications.  Reason for block:procedure for pain

## 2014-07-27 NOTE — Progress Notes (Signed)
Post Partum Day 0 Subjective: no complaints and up ad lib   PT REPORTS THAT SHE WANTS HER TUBES TIED, BUT  SHE HAS NOT SIGNED PAPERS AT THE HEALTH DEPT. She report she discussed tubal with the health dept but did NOT sign any paper.  Review of prenatal record shows that she does not have sterilization papers on record.   Objective: Blood pressure 113/77, pulse 77, temperature 98.4 F (36.9 C), temperature source Oral, resp. rate 18, height 5\' 1"  (1.549 m), weight 90.719 kg (200 lb), last menstrual period 11/07/2013, unknown if currently breastfeeding.  Physical Exam:  General: alert and no distress Lochia: appropriate Uterine Fundus:  Incision:  DVT Evaluation:    Recent Labs  07/27/14 0140  HGB 11.6*  HCT 34.3*    Assessment/Plan: Contraception  If pt wants Tubal, she will need to sign papers, while in hospital, and then return as outpatient after the 30 day wait. Pt understands, and says she wants to think about it and will let us know of her final preference tomorrow. I Had discussed the new recommendations of Salpingectomy versus "tying " tubes, and This seemed to confuse her.   LOS: 1 day   Raeshawn Vo V 07/27/2014, 3:56 PM

## 2014-07-27 NOTE — Progress Notes (Signed)
Patient ID: Claudia Miller, female   DOB: [birth year], 23 y.o.   MRN: [id number] Comfortable with epidural  Filed Vitals:   07/27/14 0350 07/27/14 0355 07/27/14 0400 07/27/14 0405  BP: 113/62 110/62 112/59 113/58  Pulse: 77 87 81 79  Temp:      TempSrc:      Resp: 18     Height:      Weight:       FHR reactive UCs spacing out  Will AROM to augment labor

## 2014-07-28 LAB — CBC
HCT: 31.8 % — ABNORMAL LOW (ref 36.0–46.0)
Hemoglobin: 10.4 g/dL — ABNORMAL LOW (ref 12.0–15.0)
MCH: 29 pg (ref 26.0–34.0)
MCHC: 32.7 g/dL (ref 30.0–36.0)
MCV: 88.6 fL (ref 78.0–100.0)
PLATELETS: 175 10*3/uL (ref 150–400)
RBC: 3.59 MIL/uL — ABNORMAL LOW (ref 3.87–5.11)
RDW: 13.7 % (ref 11.5–15.5)
WBC: 6.6 10*3/uL (ref 4.0–10.5)

## 2014-07-28 NOTE — Discharge Summary (Signed)
Obstetric Discharge Summary Reason for Admission: onset of labor Prenatal Procedures: none Intrapartum Procedures: spontaneous vaginal delivery Postpartum Procedures: none Complications-Operative and Postpartum: none Hemoglobin  Date Value Ref Range Status  07/27/2014 11.6* 12.0 - 15.0 g/dL Final     HCT  Date Value Ref Range Status  07/27/2014 34.3* 36.0 - 46.0 % Final   Patient is a 23 year old G2P2002 who presented at 4468w4d with contractions. Patient was found to be in active labor and had good progression after admission culminating in spontaneous vaginal delivery at 0900 on 07/27/2014. Bleeding was minimal, pain was controlled on ibuprofen and patient was ambulating and tolerating PO without difficulty. Patient desires bilateral tubal ligation for contraception. She was discharged home with motrin for pain control and followup with prenatal provider in 6 weeks.  Physical Exam:  General: alert, cooperative and no distress Lochia: appropriate Uterine Fundus: firm Incision: n/a DVT Evaluation: No evidence of DVT seen on physical exam. Negative Homan's sign. No cords or calf tenderness.  Discharge Diagnoses: Term Pregnancy-delivered  Discharge Information: Date: 07/28/2014 Activity: pelvic rest Diet: routine Medications: Ibuprofen Condition: stable Instructions: contact physician for any worsening pain, bleeding, fever, nausea Discharge to: home   Newborn Data: Live born female  Birth Weight: 6 lb 0.8 oz (2745 g) APGAR: 9, 9  Home with mother.  Patient seen with Dwyane DeeMarie Reve Crocket  Stephens, Devin A 07/28/2014, 2:26 AM  Seen also by me Agree with note Aviva SignsMarie L Canisha Issac, CNM

## 2014-07-28 NOTE — Discharge Instructions (Signed)

## 2014-07-29 NOTE — Progress Notes (Signed)
Post discharge chart review completed.  

## 2014-07-30 NOTE — Discharge Summary (Signed)
Attestation of Attending Supervision of Advanced Practitioner: Evaluation and management procedures were performed by the PA/NP/CNM/OB Fellow under my supervision/collaboration. Chart reviewed and agree with management and plan.  Brandey Vandalen V 07/30/2014 8:04 AM   

## 2014-10-28 ENCOUNTER — Encounter (HOSPITAL_COMMUNITY): Payer: Self-pay | Admitting: *Deleted

## 2015-12-09 ENCOUNTER — Emergency Department (HOSPITAL_COMMUNITY): Payer: Medicaid Other

## 2015-12-09 ENCOUNTER — Emergency Department (HOSPITAL_COMMUNITY)
Admission: EM | Admit: 2015-12-09 | Discharge: 2015-12-10 | Disposition: A | Discharge status: Home / Self Care | Payer: Medicaid Other | Attending: Emergency Medicine | Admitting: Emergency Medicine

## 2015-12-09 ENCOUNTER — Encounter (HOSPITAL_COMMUNITY): Payer: Self-pay | Admitting: Emergency Medicine

## 2015-12-09 DIAGNOSIS — R1031 Right lower quadrant pain: Secondary | ICD-10-CM | POA: Diagnosis not present

## 2015-12-09 DIAGNOSIS — Z3202 Encounter for pregnancy test, result negative: Secondary | ICD-10-CM | POA: Insufficient documentation

## 2015-12-09 DIAGNOSIS — Z87891 Personal history of nicotine dependence: Secondary | ICD-10-CM | POA: Diagnosis not present

## 2015-12-09 LAB — COMPREHENSIVE METABOLIC PANEL
ALK PHOS: 73 U/L (ref 38–126)
ALT: 16 U/L (ref 14–54)
AST: 15 U/L (ref 15–41)
Albumin: 3.5 g/dL (ref 3.5–5.0)
Anion gap: 7 (ref 5–15)
BUN: 7 mg/dL (ref 6–20)
CALCIUM: 9.4 mg/dL (ref 8.9–10.3)
CHLORIDE: 107 mmol/L (ref 101–111)
CO2: 25 mmol/L (ref 22–32)
CREATININE: 0.66 mg/dL (ref 0.44–1.00)
GFR calc non Af Amer: >60 mL/min (ref >60)
Glucose, Bld: 86 mg/dL (ref 65–99)
Potassium: 3.6 mmol/L (ref 3.5–5.1)
SODIUM: 139 mmol/L (ref 135–145)
Total Bilirubin: 0.3 mg/dL (ref 0.3–1.2)
Total Protein: 7.7 g/dL (ref 6.5–8.1)

## 2015-12-09 LAB — URINALYSIS, ROUTINE W REFLEX MICROSCOPIC
BILIRUBIN URINE: NEGATIVE
GLUCOSE, UA: NEGATIVE mg/dL
HGB URINE DIPSTICK: NEGATIVE
Ketones, ur: NEGATIVE mg/dL
Leukocytes, UA: NEGATIVE
Nitrite: NEGATIVE
Protein, ur: NEGATIVE mg/dL
SPECIFIC GRAVITY, URINE: 1.025 (ref 1.005–1.030)
pH: 7.5 (ref 5.0–8.0)

## 2015-12-09 LAB — CBC
HCT: 38.7 % (ref 36.0–46.0)
HEMOGLOBIN: 12.3 g/dL (ref 12.0–15.0)
MCH: 27.3 pg (ref 26.0–34.0)
MCHC: 31.8 g/dL (ref 30.0–36.0)
MCV: 86 fL (ref 78.0–100.0)
PLATELETS: 268 10*3/uL (ref 150–400)
RBC: 4.5 MIL/uL (ref 3.87–5.11)
RDW: 14 % (ref 11.5–15.5)
WBC: 7 10*3/uL (ref 4.0–10.5)

## 2015-12-09 LAB — WET PREP, GENITAL
SPERM: NONE SEEN
Trich, Wet Prep: NONE SEEN
Yeast Wet Prep HPF POC: NONE SEEN

## 2015-12-09 LAB — PREGNANCY, URINE: PREG TEST UR: NEGATIVE

## 2015-12-09 LAB — LIPASE, BLOOD: Lipase: 28 U/L (ref 11–51)

## 2015-12-09 MED ORDER — OXYCODONE-ACETAMINOPHEN 5-325 MG PO TABS
1.0000 | ORAL_TABLET | Freq: Once | ORAL | Status: AC
  Administered 2015-12-09: 1 via ORAL
  Filled 2015-12-09: qty 1

## 2015-12-09 NOTE — ED Notes (Signed)
PT reports RLQ pain that started this AM. PT denies NVD. PT is tender to palpation in RLQ. PT notes pain is worse when taking steps. PT denies any fever.

## 2015-12-09 NOTE — ED Notes (Signed)
Pt. reports RLQ pain onset this morning , denies nausea/vomitting or diarrhea , no fever or chills , denies dysuria or vaginal discharge .

## 2015-12-09 NOTE — ED Provider Notes (Signed)
CSN: [id number]     Arrival date & time 12/09/15  1919 History   First MD Initiated Contact with Patient 12/09/15 2100     Chief Complaint  Patient presents with  . Abdominal Pain     (Consider location/radiation/quality/duration/timing/severity/associated sxs/prior Treatment) Patient is a 24 y.o. female presenting with abdominal pain. The history is provided by the patient. No language interpreter was used.  Abdominal Pain Pain location:  RLQ Associated symptoms: no chills, no diarrhea, no dysuria, no fever, no nausea, no vaginal bleeding, no vaginal discharge and no vomiting   Associated symptoms comment:  Patient without other significant medical history presents with one day of RLQ abdominal pain. No nausea, vomiting, diarrhea or fever. She denies vaginal discharge, dysuria. Her last menses was 5 days ago and normal. She states the pain is toward the low abdomen and is worse with movement of the lower extremity. She felt significant pain when she would drive over uneven surfaces/bumps. The pain is better with rest but still present.    Past Medical History  Diagnosis Date  . Medical history non-contributory    Past Surgical History  Procedure Laterality Date  . No past surgeries     Family History  Problem Relation Age of Onset  . Asthma Mother   . Diabetes Mother   . Hypertension Mother   . Cancer Father   . Hypertension Father   . Asthma Sister   . Hypertension Sister   . Asthma Brother   . Hypertension Brother    Social History  Substance Use Topics  . Smoking status: Former Smoker -- 0.00 packs/day    Types: Cigarettes  . Smokeless tobacco: Never Used  . Alcohol Use: No   OB History    Gravida Para Term Preterm AB TAB SAB Ectopic Multiple Living   2 2 2       2      Review of Systems  Constitutional: Negative for fever and chills.  Gastrointestinal: Positive for abdominal pain. Negative for nausea, vomiting and diarrhea.  Genitourinary: Negative.  Negative  for dysuria, vaginal bleeding, vaginal discharge and menstrual problem.  Musculoskeletal: Negative.  Negative for myalgias.  Skin: Negative.  Negative for color change and wound.  Neurological: Negative.       Allergies  Fish allergy  Home Medications   Prior to Admission medications   Not on File   BP 121/70 mmHg  Pulse 74  Temp(Src) 98.9 F (37.2 C) (Oral)  Resp 16  Ht 5\' 1"  (1.549 m)  Wt 98.884 kg  BMI 41.21 kg/m2  SpO2 100%  LMP 12/06/2015 Physical Exam  Constitutional: She is oriented to person, place, and time. She appears well-developed and well-nourished.  HENT:  Head: Normocephalic.  Neck: Normal range of motion. Neck supple.  Cardiovascular: Normal rate and regular rhythm.   Pulmonary/Chest: Effort normal and breath sounds normal.  Abdominal: Soft. Bowel sounds are normal. There is no rebound and no guarding.  Mild tenderness over McBirney's point. Greater tenderness in low abdomen and inguinal fold. No swelling or redness of the area.   Genitourinary:  Mild yellowish discharge in vaginal vault. Mild CMT with greater right adnexal tenderness. No palpable mass. No left adnexal tenderness.   Musculoskeletal: Normal range of motion.  Neurological: She is alert and oriented to person, place, and time.  Skin: Skin is warm and dry. No rash noted.  Psychiatric: She has a normal mood and affect.    ED Course  Procedures (including critical care time) Labs  Review Labs Reviewed  URINALYSIS, ROUTINE W REFLEX MICROSCOPIC (NOT AT [facility]) - Abnormal; Notable for the following:    APPearance CLOUDY (*)    All other components within normal limits  WET PREP, GENITAL  LIPASE, BLOOD  COMPREHENSIVE METABOLIC PANEL  CBC  PREGNANCY, URINE  GC/CHLAMYDIA PROBE AMP ([place]) NOT AT [facility]   Results for orders placed or performed during the hospital encounter of 12/09/15  Wet prep, genital  Result Value Ref Range   Yeast Wet Prep HPF POC NONE SEEN NONE SEEN   Trich, Wet  Prep NONE SEEN NONE SEEN   Clue Cells Wet Prep HPF POC PRESENT (A) NONE SEEN   WBC, Wet Prep HPF POC MANY (A) NONE SEEN   Sperm NONE SEEN   Lipase, blood  Result Value Ref Range   Lipase 28 11 - 51 U/L  Comprehensive metabolic panel  Result Value Ref Range   Sodium 139 135 - 145 mmol/L   Potassium 3.6 3.5 - 5.1 mmol/L   Chloride 107 101 - 111 mmol/L   CO2 25 22 - 32 mmol/L   Glucose, Bld 86 65 - 99 mg/dL   BUN 7 6 - 20 mg/dL   Creatinine, Ser 0.860.66 0.44 - 1.00 mg/dL   Calcium 9.4 8.9 - 57.810.3 mg/dL   Total Protein 7.7 6.5 - 8.1 g/dL   Albumin 3.5 3.5 - 5.0 g/dL   AST 15 15 - 41 U/L   ALT 16 14 - 54 U/L   Alkaline Phosphatase 73 38 - 126 U/L   Total Bilirubin 0.3 0.3 - 1.2 mg/dL   GFR calc non Af Amer >60 >60 mL/min   GFR calc Af Amer >60 >60 mL/min   Anion gap 7 5 - 15  CBC  Result Value Ref Range   WBC 7.0 4.0 - 10.5 K/uL   RBC 4.50 3.87 - 5.11 MIL/uL   Hemoglobin 12.3 12.0 - 15.0 g/dL   HCT 46.938.7 62.936.0 - 52.846.0 %   MCV 86.0 78.0 - 100.0 fL   MCH 27.3 26.0 - 34.0 pg   MCHC 31.8 30.0 - 36.0 g/dL   RDW 41.314.0 24.411.5 - 01.015.5 %   Platelets 268 150 - 400 K/uL  Urinalysis, Routine w reflex microscopic (not at [facility])  Result Value Ref Range   Color, Urine YELLOW YELLOW   APPearance CLOUDY (A) CLEAR   Specific Gravity, Urine 1.025 1.005 - 1.030   pH 7.5 5.0 - 8.0   Glucose, UA NEGATIVE NEGATIVE mg/dL   Hgb urine dipstick NEGATIVE NEGATIVE   Bilirubin Urine NEGATIVE NEGATIVE   Ketones, ur NEGATIVE NEGATIVE mg/dL   Protein, ur NEGATIVE NEGATIVE mg/dL   Nitrite NEGATIVE NEGATIVE   Leukocytes, UA NEGATIVE NEGATIVE  Pregnancy, urine  Result Value Ref Range   Preg Test, Ur NEGATIVE NEGATIVE   [place] Transvaginal Non-ob  12/10/2015  CLINICAL DATA:  Right lower quadrant pain since this morning. Negative pregnancy test. EXAM: TRANSABDOMINAL AND TRANSVAGINAL ULTRASOUND OF PELVIS DOPPLER ULTRASOUND OF OVARIES TECHNIQUE: Both transabdominal and transvaginal ultrasound examinations of the pelvis  were performed. Transabdominal technique was performed for global imaging of the pelvis including uterus, ovaries, adnexal regions, and pelvic cul-de-sac. It was necessary to proceed with endovaginal exam following the transabdominal exam to visualize the ovaries and endometrium. Color and duplex Doppler ultrasound was utilized to evaluate blood flow to the ovaries. COMPARISON:  None. FINDINGS: Uterus Measurements: 7.1 x 4.3 x 4.9 cm. No fibroids or other mass visualized. Endometrium Thickness: 2 mm.  No  focal abnormality visualized. Right ovary Measurements: 3.1 x 1.9 x 1.5 cm. Normal appearance/no adnexal mass. Left ovary Measurements: 2.8 x 1.6 x 1.7 cm. Normal appearance/no adnexal mass. Pulsed Doppler evaluation of both ovaries demonstrates normal low-resistance arterial and venous waveforms. Flow is demonstrated in both ovaries on color flow Doppler imaging. Other findings No free fluid. IMPRESSION: Normal ultrasound appearance of the uterus and ovaries. Electronically Signed   By: Burman Nieves M.D.   On: 12/10/2015 02:25   US Pelvis Complete  12/10/2015  CLINICAL DATA:  Right lower quadrant pain since this morning. Negative pregnancy test. EXAM: TRANSABDOMINAL AND TRANSVAGINAL ULTRASOUND OF PELVIS DOPPLER ULTRASOUND OF OVARIES TECHNIQUE: Both transabdominal and transvaginal ultrasound examinations of the pelvis were performed. Transabdominal technique was performed for global imaging of the pelvis including uterus, ovaries, adnexal regions, and pelvic cul-de-sac. It was necessary to proceed with endovaginal exam following the transabdominal exam to visualize the ovaries and endometrium. Color and duplex Doppler ultrasound was utilized to evaluate blood flow to the ovaries. COMPARISON:  None. FINDINGS: Uterus Measurements: 7.1 x 4.3 x 4.9 cm. No fibroids or other mass visualized. Endometrium Thickness: 2 mm.  No focal abnormality visualized. Right ovary Measurements: 3.1 x 1.9 x 1.5 cm. Normal  appearance/no adnexal mass. Left ovary Measurements: 2.8 x 1.6 x 1.7 cm. Normal appearance/no adnexal mass. Pulsed Doppler evaluation of both ovaries demonstrates normal low-resistance arterial and venous waveforms. Flow is demonstrated in both ovaries on color flow Doppler imaging. Other findings No free fluid. IMPRESSION: Normal ultrasound appearance of the uterus and ovaries. Electronically Signed   By: Burman Nieves M.D.   On: 12/10/2015 02:25   Ct Abdomen Pelvis W Contrast  12/10/2015  CLINICAL DATA:  Acute onset of right lower quadrant abdominal pain and tenderness. Initial encounter. EXAM: CT ABDOMEN AND PELVIS WITH CONTRAST TECHNIQUE: Multidetector CT imaging of the abdomen and pelvis was performed using the standard protocol following bolus administration of intravenous contrast. CONTRAST:  100 mL of Omnipaque 300 IV contrast COMPARISON:  Pelvic ultrasound performed earlier today at 12:07 a.m. FINDINGS: The visualized lung bases are clear. The liver and spleen are unremarkable in appearance. The gallbladder is within normal limits. The pancreas and adrenal glands are unremarkable. The kidneys are unremarkable in appearance. There is no evidence of hydronephrosis. No renal or ureteral stones are seen. No perinephric stranding is appreciated. No free fluid is identified. The small bowel is unremarkable in appearance. The stomach is within normal limits. No acute vascular abnormalities are seen. The appendix is normal in caliber, without evidence of appendicitis. The colon is unremarkable in appearance. The bladder is mildly distended and grossly unremarkable. The uterus is unremarkable in appearance. The ovaries are relatively symmetric. No suspicious adnexal masses are seen. No inguinal lymphadenopathy is seen. No acute osseous abnormalities are identified. IMPRESSION: Unremarkable contrast-enhanced CT of the abdomen and pelvis. Electronically Signed   By: Roanna Raider M.D.   On: 12/10/2015 03:41    [place] Art/ven Flow Abd Pelv Doppler  12/10/2015  CLINICAL DATA:  Right lower quadrant pain since this morning. Negative pregnancy test. EXAM: TRANSABDOMINAL AND TRANSVAGINAL ULTRASOUND OF PELVIS DOPPLER ULTRASOUND OF OVARIES TECHNIQUE: Both transabdominal and transvaginal ultrasound examinations of the pelvis were performed. Transabdominal technique was performed for global imaging of the pelvis including uterus, ovaries, adnexal regions, and pelvic cul-de-sac. It was necessary to proceed with endovaginal exam following the transabdominal exam to visualize the ovaries and endometrium. Color and duplex Doppler ultrasound was utilized to evaluate blood flow to  the ovaries. COMPARISON:  None. FINDINGS: Uterus Measurements: 7.1 x 4.3 x 4.9 cm. No fibroids or other mass visualized. Endometrium Thickness: 2 mm.  No focal abnormality visualized. Right ovary Measurements: 3.1 x 1.9 x 1.5 cm. Normal appearance/no adnexal mass. Left ovary Measurements: 2.8 x 1.6 x 1.7 cm. Normal appearance/no adnexal mass. Pulsed Doppler evaluation of both ovaries demonstrates normal low-resistance arterial and venous waveforms. Flow is demonstrated in both ovaries on color flow Doppler imaging. Other findings No free fluid. IMPRESSION: Normal ultrasound appearance of the uterus and ovaries. Electronically Signed   By: Burman Nieves M.D.   On: 12/10/2015 02:25    Imaging Review No results found. I have personally reviewed and evaluated these images and lab results as part of my medical decision-making.   EKG Interpretation None      MDM   Final diagnoses:  None    1. RLQ abdominal pain  DDx: appy vs pelvic source (ovarian cyst, torsion, PID) vs soft tissue, possibly ligamentous pain in inguinal area. Based on adnexal tenderness on pelvic exam, will order ultrasound as first imaging choice. Wet prep/GC pending. Pain addressed. Will observe.   Pelvic US and CT r/o appy are both negative. Suspect musculoskeletal cause  of pain in well appearing patient. Discharge home appropriate.   Elpidio Anis, PA-C 12/10/15 1610  Blane Ohara, MD 12/13/15 [phone]

## 2015-12-10 ENCOUNTER — Emergency Department (HOSPITAL_COMMUNITY): Payer: Medicaid Other

## 2015-12-10 ENCOUNTER — Encounter (HOSPITAL_COMMUNITY): Payer: Self-pay

## 2015-12-10 LAB — GC/CHLAMYDIA PROBE AMP (~~LOC~~) NOT AT ARMC
Chlamydia: NEGATIVE
Neisseria Gonorrhea: NEGATIVE

## 2015-12-10 MED ORDER — CYCLOBENZAPRINE HCL 10 MG PO TABS: 10.0000 mg | ORAL_TABLET | Freq: Two times a day (BID) | ORAL | Status: DC | PRN

## 2015-12-10 MED ORDER — IOHEXOL 300 MG/ML  SOLN
100.0000 mL | Freq: Once | INTRAMUSCULAR | Status: AC | PRN
  Administered 2015-12-10: 100 mL via INTRAVENOUS

## 2015-12-10 MED ORDER — IBUPROFEN 800 MG PO TABS: 800.0000 mg | ORAL_TABLET | Freq: Three times a day (TID) | ORAL | Status: DC

## 2015-12-10 MED ORDER — ONDANSETRON HCL 4 MG/2ML IJ SOLN
4.0000 mg | Freq: Once | INTRAMUSCULAR | Status: AC
  Administered 2015-12-10: 4 mg via INTRAVENOUS
  Filled 2015-12-10: qty 2

## 2015-12-10 MED ORDER — MORPHINE SULFATE (PF) 4 MG/ML IV SOLN
4.0000 mg | Freq: Once | INTRAVENOUS | Status: AC
  Administered 2015-12-10: 4 mg via INTRAVENOUS
  Filled 2015-12-10: qty 1

## 2015-12-10 MED ORDER — IOHEXOL 300 MG/ML  SOLN: 100.0000 mL | Freq: Once | INTRAMUSCULAR | Status: DC | PRN

## 2015-12-10 NOTE — ED Notes (Signed)
To ultrasound

## 2015-12-10 NOTE — Discharge Instructions (Signed)

## 2015-12-10 NOTE — ED Notes (Signed)
Patient transported to CT 

## 2015-12-10 NOTE — ED Notes (Signed)
Patient is alert and orientedx4.  Patient was explained discharge instructions and they understood them with no questions.  Her friend, Ellard ArtisJamilla Goss is taking the patient home.

## 2016-09-22 ENCOUNTER — Ambulatory Visit: Payer: Self-pay

## 2016-09-23 ENCOUNTER — Ambulatory Visit (INDEPENDENT_AMBULATORY_CARE_PROVIDER_SITE_OTHER): Payer: 59 | Admitting: Family Medicine

## 2016-09-23 ENCOUNTER — Ambulatory Visit: Payer: Self-pay

## 2016-09-23 VITALS — BP 110/80 | HR 78 | Temp 98.5°F | Resp 17 | Ht 62.0 in | Wt 223.0 lb

## 2016-09-23 DIAGNOSIS — K649 Unspecified hemorrhoids: Secondary | ICD-10-CM | POA: Diagnosis not present

## 2016-09-23 MED ORDER — HYDROCORTISONE ACETATE 25 MG RE SUPP: 25.0000 mg | Freq: Two times a day (BID) | RECTAL | 0 refills | Status: DC

## 2016-09-23 NOTE — Patient Instructions (Addendum)
Insert An-usol suppository twice daily for treatment of hemorrhoid. Increase intake of water and take an over the counter stool softener daily to increase bowel movement frequency and loosen stool. Follow-up if condition doesn't improve.  Godfrey Pick. Tiburcio Pea, MSN, FNP-C [facility]   IF you received an x-ray today, you will receive an invoice from [facility]. Please contact [facility] at [phone] with questions or concerns regarding your invoice.   IF you received labwork today, you will receive an invoice from [employer]. Please contact [facility] at [phone] with questions or concerns regarding your invoice.   Our billing staff will not be able to assist you with questions regarding bills from these companies.  You will be contacted with the lab results as soon as they are available. The fastest way to get your results is to activate your My Chart account. Instructions are located on the last page of this paperwork. If you have not heard from [place] regarding the results in 2 weeks, please contact this office.    About Hemorrhoids  Hemorrhoids are swollen veins in the lower rectum and anus.  Also called piles, hemorrhoids are a common problem.  Hemorrhoids may be internal (inside the rectum) or external (around the anus).  Internal Hemorrhoids  Internal hemorrhoids are often painless, but they rarely cause bleeding.  The internal veins may stretch and fall down (prolapse) through the anus to the outside of the body.  The veins may then become irritated and painful.  External Hemorrhoids  External hemorrhoids can be easily seen or felt around the anal opening.  They are under the skin around the anus.  When the swollen veins are scratched or broken by straining, rubbing or wiping they sometimes bleed.  How Hemorrhoids Occur  Veins in the rectum and around the anus tend to swell under pressure.   Hemorrhoids can result from increased pressure in the veins of your anus or rectum.  Some sources of pressure are:   Straining to have a bowel movement because of constipation  Waiting too long to have a bowel movement  Coughing and sneezing often  Sitting for extended periods of time, including on the toilet  Diarrhea  Obesity  Trauma or injury to the anus  Some liver diseases  Stress  Family history of hemorrhoids  Pregnancy  Pregnant women should try to avoid becoming constipated, because they are more likely to have hemorrhoids during pregnancy.  In the last trimester of pregnancy, the enlarged uterus may press on blood vessels and causes hemorrhoids.  In addition, the strain of childbirth sometimes causes hemorrhoids after the birth.  Symptoms of Hemorrhoids  Some symptoms of hemorrhoids include:  Swelling and/or a tender lump around the anus  Itching, mild burning and bleeding around the anus  Painful bowel movements with or without constipation  Bright red blood covering the stool, on toilet paper or in the toilet bowel.   Symptoms usually go away within a few days.  Always talk to your doctor about any bleeding to make sure it is not from some other causes.  Diagnosing and Treating Hemorrhoids  Diagnosis is made by an examination by your healthcare provider.  Special test can be performed by your doctor.    Most cases of hemorrhoids can be treated with:  High-fiber diet: Eat more high-fiber foods, which help prevent constipation.  Ask for more detailed fiber information on types and sources of fiber from your healthcare provider.  Fluids: Drink plenty of water.  This helps soften bowel movements so they are easier to pass.  Sitz baths and cold packs: Sitting in lukewarm water two or three times a day for 15 minutes cleases the anal area and may relieve discomfort.  If the water is too hot, swelling around the anus will get worse.  Placing a cloth-covered ice  pack on the anus for ten minutes four times a day can also help reduce selling.  Gently pushing a prolapsed hemorrhoid back inside after the bath or ice pack can be helpful.  Medications: For mild discomfort, your healthcare provider may suggest over-the-counter pain medication or prescribe a cream or ointment for topical use.  The cream may contain witch hazel, zinc oxide or petroleum jelly.  Medicated suppositories are also a treatment option.  Always consult your doctor before applying medications or creams.  Procedures and surgeries: There are also a number of procedures and surgeries to shrink or remove hemorrhoids in more serious cases.  Talk to your physician about these options.  You can often prevent hemorrhoids or keep them from becoming worse by maintaining a healthy lifestyle.  Eat a fiber-rich diet of fruits, vegetables and whole grains.  Also, drink plenty of water and exercise regularly.   2007, [employer] Doc.30

## 2016-09-23 NOTE — Progress Notes (Signed)
Patient ID: Claudia Miller, female    DOB: 1991-01-03, 25 y.o.   MRN: [id number]  PCP: PROVIDER NOT IN SYSTEM  Chief Complaint  Patient presents with  . Rectal Pain    Bleeding onset yesterday/ pain 2 days    Subjective:   HPI 25 year old presents for evaluation of hemorrhoid times 2 days.  She reports small amount of blood on toilet paper after bowel movement. Reports recent increase firmness of stools. She has been refraining from having a bowel movement over the last two days as she reports increased aching rectal pain. She has been afraid to  have a bowel movement. Denies a history of constipation or any changes in diets. Feels that she drinks enough water.  Social History   Social History  . Marital status: Single    Spouse name: N/A  . Number of children: N/A  . Years of education: N/A   Occupational History  . Not on file.   Social History Main Topics  . Smoking status: Former Smoker    Packs/day: 0.00    Types: Cigarettes  . Smokeless tobacco: Never Used  . Alcohol use No  . Drug use: No  . Sexual activity: Not on file   Other Topics Concern  . Not on file   Social History Narrative  . No narrative on file   Family History  Problem Relation Age of Onset  . Asthma Mother   . Diabetes Mother   . Hypertension Mother   . Cancer Father   . Hypertension Father   . Asthma Sister   . Hypertension Sister   . Asthma Brother   . Hypertension Brother    Review of Systems See HPI   Patient Active Problem List   Diagnosis Date Noted  . Insufficient prenatal care in third trimester 07/27/2014  . Trichomonal vaginitis during pregnancy 07/27/2014  . Normal labor 07/27/2014  . Active labor 07/27/2014     Prior to Admission medications   Medication Sig Start Date End Date Taking? Authorizing Provider  cyclobenzaprine (FLEXERIL) 10 MG tablet Take 1 tablet (10 mg total) by mouth 2 (two) times daily as needed for muscle spasms. 12/10/15  Yes Shari Upstill,  PA-C  ibuprofen (ADVIL,MOTRIN) 800 MG tablet Take 1 tablet (800 mg total) by mouth 3 (three) times daily. 12/10/15  Yes Elpidio AnisShari Upstill, PA-C     Allergies  Allergen Reactions  . Fish Allergy Anaphylaxis       Objective:  Physical Exam  Constitutional: She is oriented to person, place, and time. She appears well-developed and well-nourished.  HENT:  Head: Normocephalic and atraumatic.  Right Ear: External ear normal.  Left Ear: External ear normal.  Nose: Nose normal.  Eyes: Conjunctivae are normal. Pupils are equal, round, and reactive to light.  Neck: Normal range of motion. Neck supple.  Cardiovascular: Normal rate, regular rhythm and normal heart sounds.   Pulmonary/Chest: Effort normal and breath sounds normal.  Genitourinary:  Genitourinary Comments: internal, firm, palpable mass, located on left lateral rectal wall at the opening of anus. Nnon-occluding of the anal opening    Musculoskeletal: Normal range of motion.  Neurological: She is alert and oriented to person, place, and time.  Skin: Skin is warm and dry.  Psychiatric: She has a normal mood and affect. Her behavior is normal. Judgment and thought content normal.   Vitals:   09/23/16 1016  BP: 110/80  Pulse: 78  Resp: 17  Temp: 98.5 F (36.9 C)  Assessment & Plan:  1. Hemorrhoids, unspecified hemorrhoid type, rectal exam, positive for Grade 1,  non-thrombosed hemorrhoid.  . hydrocortisone (ANUSOL-HC) 25 MG suppository    Sig: Place 1 suppository (25 mg total) rectally 2 (two) times daily.   Increase intake of water and take an over the counter stool softener daily to increase bowel movement frequency and loosen stool. Follow-up if condition doesn't improve.  Godfrey Pick. Tiburcio Pea, MSN, FNP-C [facility]

## 2016-11-26 ENCOUNTER — Ambulatory Visit (INDEPENDENT_AMBULATORY_CARE_PROVIDER_SITE_OTHER): Payer: 59 | Admitting: Physician Assistant

## 2016-11-26 ENCOUNTER — Ambulatory Visit (HOSPITAL_BASED_OUTPATIENT_CLINIC_OR_DEPARTMENT_OTHER)
Admission: RE | Admit: 2016-11-26 | Discharge: 2016-11-26 | Disposition: A | Discharge status: Home / Self Care | Payer: Medicaid Other | Source: Ambulatory Visit | Attending: Physician Assistant | Admitting: Physician Assistant

## 2016-11-26 ENCOUNTER — Telehealth: Payer: Self-pay

## 2016-11-26 VITALS — BP 130/80 | HR 85 | Temp 98.5°F | Resp 17 | Ht 62.5 in | Wt 228.0 lb

## 2016-11-26 DIAGNOSIS — R7989 Other specified abnormal findings of blood chemistry: Secondary | ICD-10-CM | POA: Diagnosis not present

## 2016-11-26 DIAGNOSIS — R209 Unspecified disturbances of skin sensation: Secondary | ICD-10-CM | POA: Diagnosis not present

## 2016-11-26 DIAGNOSIS — M79662 Pain in left lower leg: Secondary | ICD-10-CM | POA: Insufficient documentation

## 2016-11-26 LAB — D-DIMER, QUANTITATIVE (NOT AT ARMC): D-Dimer, Quant: 1.04 mcg/mL FEU — ABNORMAL HIGH (ref <0.50)

## 2016-11-26 LAB — POCT GLYCOSYLATED HEMOGLOBIN (HGB A1C): HEMOGLOBIN A1C: 5.6

## 2016-11-26 NOTE — Progress Notes (Signed)
Thank you Clydie BraunKaren.  Deliah BostonMichael Darbi Chandran, MS, PA-C 12:55 PM, 11/26/2016

## 2016-11-26 NOTE — Progress Notes (Signed)
Positive d dimer. Venous doppler of left leg scheduled for 130 today at [facility]

## 2016-11-26 NOTE — Patient Instructions (Addendum)
  Take 400-600 mg of ibuprofen every 6-8 hours as needed for pain after we know this is not a blood clot.    IF you received an x-ray today, you will receive an invoice from [facility]. Please contact [facility] at [phone] with questions or concerns regarding your invoice.   IF you received labwork today, you will receive an invoice from [facility]. Please contact [facility] at [phone] with questions or concerns regarding your invoice.   Our billing staff will not be able to assist you with questions regarding bills from these companies.  You will be contacted with the lab results as soon as they are available. The fastest way to get your results is to activate your My Chart account. Instructions are located on the last page of this paperwork. If you have not heard from us regarding the results in 2 weeks, please contact this office.

## 2016-11-26 NOTE — Addendum Note (Signed)
Addended by: Clarene CritchleyKOLLER, Geovany Trudo M on: 11/26/2016 12:38 PM   Modules accepted: Orders

## 2016-11-26 NOTE — Telephone Encounter (Signed)
L/M WITH NEG U/S RESULTS PER MICHAEL CLARK AND TOLD TO USE IBUPROFEN AND SEE US IF WORSENING OR NO IMPROVEMENT IN 2 WEEKS.

## 2016-11-26 NOTE — Addendum Note (Signed)
Addended by: Ofilia NeasLARK, Avian Konigsberg L on: 11/26/2016 12:27 PM   Modules accepted: Orders

## 2016-11-26 NOTE — Progress Notes (Addendum)
11/26/2016 12:26 PM   DOB: Mar 08, 1991 / MRN: [id number]  SUBJECTIVE:  Claudia Miller is a 25 y.o. female presenting for left leg tightness and "throbbing pain." This started about 4 days ago.  The pain is worsened by bending her knee. She thinks the leg is swollen. She denies taking OCPs, IUD or implant. She does not smoke but her partner smokes and she is around her often.  She denies chest pain, SOB, new DOE.  She has a family history of diabetes and is concerned her symptoms may represent diabetes.  She would like to be screened today.  He has multiple episodes of nocturia nightly.  Complains of pain of the left lateral foot and decreased sensation.   She is allergic to fish allergy.   She  has a past medical history of Medical history non-contributory.    She  reports that she has quit smoking. Her smoking use included Cigarettes. She smoked 0.00 packs per day. She has never used smokeless tobacco. She reports that she does not drink alcohol or use drugs. She  has no sexual activity history on file. The patient  has a past surgical history that includes No past surgeries.  Her family history includes Asthma in her brother, mother, and sister; Cancer in her father; Diabetes in her mother; Hypertension in her brother, father, mother, and sister.  Review of Systems  Constitutional: Negative for chills and fever.  Respiratory: Negative for cough and shortness of breath.   Cardiovascular: Positive for leg swelling. Negative for chest pain, palpitations, orthopnea, claudication and PND.  Gastrointestinal: Negative for nausea.  Musculoskeletal: Negative for myalgias.  Skin: Negative for itching and rash.  Neurological: Positive for sensory change. Negative for dizziness.    The problem list and medications were reviewed and updated by myself where necessary and exist elsewhere in the encounter.   OBJECTIVE:  BP 130/80 (BP Location: Right Arm, Patient Position: Sitting, Cuff Size: Normal)    Pulse 85   Temp 98.5 F (36.9 C) (Oral)   Resp 17   Ht 5' 2.5" (1.588 m)   Wt 228 lb (103.4 kg)   LMP 11/26/2016 (Approximate)   SpO2 97%   BMI 41.04 kg/m   Physical Exam  Constitutional: She is oriented to person, place, and time.  Cardiovascular: Normal rate, regular rhythm and normal heart sounds.   Pulses:      Dorsalis pedis pulses are 2+ on the right side.       Posterior tibial pulses are 2+ on the right side.  Pulmonary/Chest: Effort normal and breath sounds normal.  Musculoskeletal: She exhibits tenderness (left superior posterior calf, negative Homan's.  Negative for swelling.  DP and PT pulses 2+.  Decresed sensation to monofilament bilaterally. ).  Neurological: She is alert and oriented to person, place, and time.  Skin: Skin is warm.    Results for orders placed or performed in visit on 11/26/16 (from the past 72 hour(s))  D-dimer, quantitative (not [facility])     Status: Abnormal   Collection Time: 11/26/16 10:12 AM  Result Value Ref Range   D-Dimer, Quant 1.04 (H) <0.50 mcg/mL FEU    Comment:   The D-Dimer test is used frequently to exclude an acute PE or DVT.  In patients with a low to moderate clinical risk assessment and a D-Dimer result <0.50 mcg/mL FEU, the likelihood of a PE or DVT is very low.  However, a thromboembolic event should not be excluded solely on the basis of the  D-Dimer level.  Increased levels of D-Dimer are associated with a PE, DVT, DIC, malignancies, inflammation, sepsis, surgery, trauma, pregnancy, and advancing patient age. [Jama 2006 11:295(2): 199-207]   For additional information, please refer to: http://education.[facility].com/faq/FAQ149 (This Claudia is being provided for information/ educational purposes only)     POCT glycosylated hemoglobin (Hb A1C)     Status: None   Collection Time: 11/26/16 10:43 AM  Result Value Ref Range   Hemoglobin A1C 5.6     No results found.  ASSESSMENT AND PLAN:  [phone]  Claudia Miller was seen today for leg pain.  Diagnoses and all orders for this visit:  Pain of left calf Comments: This is a new problem.  Will rule out a DVT.  She is not diabetic.  If D-dimer is negative then trial of NSAIDS.  Altered sensation may be due to callus.  Orders: -     [facility]dim[facility], [facility] (not [facility]) -     VAS US LOWER EXTREMITY VENOUS (DVT); Future  Altered sensation, foot -     POCT glycosylated hemoglobin (Hb A1C)  D-dimer, elevated -     VAS US LOWER EXTREMITY VENOUS (DVT); Future    The patient is advised to call or return to clinic if she does not see an improvement in symptoms, or to seek the care of the closest emergency department if she worsens with the above plan.   Deliah BostonMichael Clark, MHS, PA-C [facility] 11/26/2016 12:26 PM

## 2016-11-28 ENCOUNTER — Emergency Department (HOSPITAL_COMMUNITY)
Admission: EM | Admit: 2016-11-28 | Discharge: 2016-11-28 | Disposition: A | Discharge status: Home / Self Care | Payer: Medicaid Other | Attending: Emergency Medicine | Admitting: Emergency Medicine

## 2016-11-28 ENCOUNTER — Emergency Department (HOSPITAL_COMMUNITY): Payer: Medicaid Other

## 2016-11-28 ENCOUNTER — Encounter (HOSPITAL_COMMUNITY): Payer: Self-pay

## 2016-11-28 DIAGNOSIS — R51 Headache: Secondary | ICD-10-CM | POA: Insufficient documentation

## 2016-11-28 DIAGNOSIS — Z79899 Other long term (current) drug therapy: Secondary | ICD-10-CM | POA: Insufficient documentation

## 2016-11-28 DIAGNOSIS — Z7901 Long term (current) use of anticoagulants: Secondary | ICD-10-CM | POA: Insufficient documentation

## 2016-11-28 DIAGNOSIS — I824Y2 Acute embolism and thrombosis of unspecified deep veins of left proximal lower extremity: Secondary | ICD-10-CM

## 2016-11-28 DIAGNOSIS — Z87891 Personal history of nicotine dependence: Secondary | ICD-10-CM | POA: Insufficient documentation

## 2016-11-28 DIAGNOSIS — I82492 Acute embolism and thrombosis of other specified deep vein of left lower extremity: Secondary | ICD-10-CM | POA: Insufficient documentation

## 2016-11-28 HISTORY — DX: Acute embolism and thrombosis of unspecified deep veins of unspecified lower extremity: I82.409

## 2016-11-28 LAB — COMPREHENSIVE METABOLIC PANEL
ALBUMIN: 3.4 g/dL — AB (ref 3.5–5.0)
ALT: 13 U/L — AB (ref 14–54)
ANION GAP: 10 (ref 5–15)
AST: 20 U/L (ref 15–41)
Alkaline Phosphatase: 62 U/L (ref 38–126)
BUN: 13 mg/dL (ref 6–20)
CHLORIDE: 106 mmol/L (ref 101–111)
CO2: 22 mmol/L (ref 22–32)
Calcium: 8.8 mg/dL — ABNORMAL LOW (ref 8.9–10.3)
Creatinine, Ser: 0.63 mg/dL (ref 0.44–1.00)
GFR calc non Af Amer: >60 mL/min (ref >60)
GLUCOSE: 108 mg/dL — AB (ref 65–99)
Potassium: 3.8 mmol/L (ref 3.5–5.1)
SODIUM: 138 mmol/L (ref 135–145)
Total Bilirubin: 0.2 mg/dL — ABNORMAL LOW (ref 0.3–1.2)
Total Protein: 7.2 g/dL (ref 6.5–8.1)

## 2016-11-28 LAB — CBC
HCT: 37.5 % (ref 36.0–46.0)
HEMOGLOBIN: 12.2 g/dL (ref 12.0–15.0)
MCH: 27.8 pg (ref 26.0–34.0)
MCHC: 32.5 g/dL (ref 30.0–36.0)
MCV: 85.4 fL (ref 78.0–100.0)
PLATELETS: 254 10*3/uL (ref 150–400)
RBC: 4.39 MIL/uL (ref 3.87–5.11)
RDW: 13.6 % (ref 11.5–15.5)
WBC: 6.1 10*3/uL (ref 4.0–10.5)

## 2016-11-28 LAB — I-STAT BETA HCG BLOOD, ED (MC, WL, AP ONLY)

## 2016-11-28 MED ORDER — SODIUM CHLORIDE 0.9 % IV BOLUS (SEPSIS)
1000.0000 mL | Freq: Once | INTRAVENOUS | Status: AC
  Administered 2016-11-28: 1000 mL via INTRAVENOUS

## 2016-11-28 MED ORDER — IOPAMIDOL (ISOVUE-370) INJECTION 76%
INTRAVENOUS | Status: AC
  Administered 2016-11-28: 80 mL
  Filled 2016-11-28: qty 100

## 2016-11-28 MED ORDER — DEXAMETHASONE SODIUM PHOSPHATE 10 MG/ML IJ SOLN
10.0000 mg | Freq: Once | INTRAMUSCULAR | Status: AC
  Administered 2016-11-28: 10 mg via INTRAVENOUS
  Filled 2016-11-28: qty 1

## 2016-11-28 MED ORDER — BUTALBITAL-APAP-CAFFEINE 50-325-40 MG PO TABS: 1.0000 | ORAL_TABLET | Freq: Four times a day (QID) | ORAL | 0 refills | Status: DC | PRN

## 2016-11-28 MED ORDER — DIPHENHYDRAMINE HCL 50 MG/ML IJ SOLN
25.0000 mg | Freq: Once | INTRAMUSCULAR | Status: AC
  Administered 2016-11-28: 25 mg via INTRAVENOUS
  Filled 2016-11-28: qty 1

## 2016-11-28 MED ORDER — PROCHLORPERAZINE EDISYLATE 5 MG/ML IJ SOLN
10.0000 mg | Freq: Once | INTRAMUSCULAR | Status: AC
  Administered 2016-11-28: 10 mg via INTRAVENOUS
  Filled 2016-11-28: qty 2

## 2016-11-28 MED ORDER — RIVAROXABAN (XARELTO) EDUCATION KIT FOR DVT/PE PATIENTS
PACK | Freq: Once | Status: AC
  Administered 2016-11-28: 20:00:00
  Filled 2016-11-28: qty 1

## 2016-11-28 MED ORDER — RIVAROXABAN 15 MG PO TABS
15.0000 mg | ORAL_TABLET | Freq: Once | ORAL | Status: AC
  Administered 2016-11-28: 15 mg via ORAL
  Filled 2016-11-28: qty 1

## 2016-11-28 MED ORDER — RIVAROXABAN (XARELTO) VTE STARTER PACK (15 & 20 MG): ORAL_TABLET | ORAL | 0 refills | Status: DC

## 2016-11-28 NOTE — ED Notes (Signed)
Pt being transported to CT

## 2016-11-28 NOTE — ED Notes (Signed)
IV attempted x1. Requesting another RN try.

## 2016-11-28 NOTE — Discharge Instructions (Signed)
Please follow with your primary care doctor in the next 2 days for a check-up. They must obtain records for further management.  ° °Do not hesitate to return to the Emergency Department for any new, worsening or concerning symptoms.  ° °

## 2016-11-28 NOTE — ED Provider Notes (Signed)
[place] DEPT Provider Note   CSN: [id number] Arrival date & time: 11/28/16  1511     History   Chief Complaint Chief Complaint  Patient presents with  . newly diagnosed DVT/headache   HPI   Blood pressure 129/60, pulse 94, temperature 98 F (36.7 C), temperature source Oral, resp. rate 20, last menstrual period 11/26/2016, SpO2 97 %, unknown if currently breastfeeding.  Claudia Miller is a 25 y.o. 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 There was no syncope. She states that she has a history of migraines but this feels slightly different in that it is more severe and she didn't have a prodrome of spots in her vision. She denies cervicalgia, loss of consciousness, numbness, weakness. She states that she was recently diagnosed with a right lower extremity DVT at the health department, she has not started on any anticoagulation because she missed the notification phone call. She denies any chest pain, hemoptysis, fever, shortness of breath but endorses palpitations onset with the headache. She's not on any hormonal birth control, she has had no recent surgeries or immobilizations, no history of prior DVT.  Past Medical History:  Diagnosis Date  . DVT (deep venous thrombosis) ([place])   . Medical history non-contributory     Patient Active Problem List   Diagnosis Date Noted  . Insufficient prenatal care in third trimester 07/27/2014  . Trichomonal vaginitis during pregnancy 07/27/2014  . Normal labor 07/27/2014  . Active labor 07/27/2014    Past Surgical History:  Procedure Laterality Date  . NO PAST SURGERIES      OB History    Gravida Para Term Preterm AB Living   _0 SAB TAB Ectopic Multiple Live Births           2       Home Medications      Prior to Admission medications   Medication Sig Start Date End Date Taking? Authorizing Provider  butalbital-acetaminophen-caffeine (FIORICET, ESGIC) 50-325-40 MG tablet Take 1 tablet by mouth every 6 (six) hours as needed for headache. 11/28/16   Jasper Hanf, PA-C  hydrocortisone (ANUSOL-HC) 25 MG suppository Place 1 suppository (25 mg total) rectally 2 (two) times daily. 09/23/16   Sedalia Muta, FNP  ibuprofen (ADVIL,MOTRIN) 800 MG tablet Take 1 tablet (800 mg total) by mouth 3 (three) times daily. 12/10/15   Charlann Lange, PA-C  Rivaroxaban 15 & 20 MG TBPK Take as directed on package: Start with one 58m tablet by mouth twice a day with food. On Day 22, switch to one 26mtablet once a day with food. 11/28/16   NiElmyra Ricksisciotta, PA-C    Family History Family History  Problem Relation Age of Onset  . Asthma Mother   . Diabetes Mother   . Hypertension Mother   . Cancer Father   . Hypertension Father   . Asthma Sister   . Hypertension Sister   . Asthma Brother   . Hypertension Brother     Social History Social History  Substance Use Topics  . Smoking status: Former Smoker    Packs/day: 0.00    Types: Cigarettes  . Smokeless tobacco: Never Used  . Alcohol use No     Allergies   Fish allergy   Review  of Systems Review of Systems  10 systems reviewed and found to be negative, except as noted in the HPI.  Physical Exam Updated Vital Signs BP 131/70   Pulse 85   Temp 98.2 F (36.8 C) (Oral)   Resp 18   LMP 11/26/2016 (Approximate)   SpO2 100%   Physical Exam  Constitutional: She is oriented to person, place, and time. She appears well-developed and well-nourished. No distress.  HENT:  Head: Normocephalic and atraumatic.  Mouth/Throat: Oropharynx is clear and moist.  Eyes: Conjunctivae and EOM are normal. Pupils are equal, round, and reactive to light.  No TTP of maxillary or frontal sinuses  No TTP or induration of temporal arteries  bilaterally  Neck: Normal range of motion. Neck supple. No JVD present. No tracheal deviation present.  FROM to C-spine. Pt can touch chin to chest without discomfort. No TTP of midline cervical spine.   Cardiovascular: Normal rate, regular rhythm and intact distal pulses.   Radial pulse equal bilaterally  Pulmonary/Chest: Effort normal and breath sounds normal. No stridor. No respiratory distress. She has no wheezes. She has no rales. She exhibits no tenderness.  Abdominal: Soft. Bowel sounds are normal. She exhibits no distension and no mass. There is no tenderness. There is no rebound and no guarding.  Musculoskeletal: Normal range of motion. She exhibits no edema or tenderness.  No calf asymmetry, superficial collaterals, palpable cords, edema, Homans sign negative bilaterally.    Neurological: She is alert and oriented to person, place, and time. No cranial nerve deficit.  II-Visual fields grossly intact. III/IV/VI-Extraocular movements intact.  Pupils reactive bilaterally. V/VII-Smile symmetric, equal eyebrow raise,  facial sensation intact VIII- Hearing grossly intact IX/X-Normal gag XI-bilateral shoulder shrug XII-midline tongue extension Motor: 5/5 bilaterally with normal tone and bulk Cerebellar: Normal finger-to-nose  and normal heel-to-shin test.   Romberg negative Ambulates with a coordinated gait   Skin: Skin is warm. She is not diaphoretic.  Psychiatric: She has a normal mood and affect.  Nursing note and vitals reviewed.    ED Treatments / Results  Labs (all labs ordered are listed, but only abnormal results are displayed) Labs Reviewed  COMPREHENSIVE METABOLIC PANEL - Abnormal; Notable for the following:       Result Value   Glucose, Bld 108 (*)    Calcium 8.8 (*)    Albumin 3.4 (*)    ALT 13 (*)    Total Bilirubin 0.2 (*)    All other components within normal limits  CBC  I-STAT BETA HCG BLOOD, ED ([facility], [facility], [facility] ONLY)    EKG  EKG Interpretation None        Radiology Ct Head Wo Contrast  Result Date: 11/28/2016 CLINICAL DATA:  Acute onset of headache and nausea. Initial encounter. EXAM: CT HEAD WITHOUT CONTRAST TECHNIQUE: Contiguous axial images were obtained from the base of the skull through the vertex without intravenous contrast. COMPARISON:  None. FINDINGS: Brain: No evidence of acute infarction, hemorrhage, hydrocephalus, extra-axial collection or mass lesion/mass effect. The posterior fossa, including the cerebellum, brainstem and fourth ventricle, is within normal limits. The third and lateral ventricles, and basal ganglia are unremarkable in appearance. The cerebral hemispheres are symmetric in appearance, with normal gray-white differentiation. No mass effect or midline shift is seen. Vascular: No hyperdense vessel or unexpected calcification. Skull: There is no evidence of fracture; visualized osseous structures are unremarkable in appearance. Sinuses/Orbits: The visualized portions of the orbits are within normal limits. The paranasal sinuses and mastoid air cells are  well-aerated. Other: No significant soft tissue abnormalities are seen. IMPRESSION: Unremarkable noncontrast CT of the head. Electronically Signed   By: Garald Balding M.D.   On: 11/28/2016 18:35   Ct Angio Chest Pe W And/or Wo Contrast  Result Date: 11/28/2016 CLINICAL DATA:  Acute onset of palpitations. Recently diagnosed with deep venous thrombosis. Initial encounter. EXAM: CT ANGIOGRAPHY CHEST WITH CONTRAST TECHNIQUE: Multidetector CT imaging of the chest was performed using the standard protocol during bolus administration of intravenous contrast. Multiplanar CT image reconstructions and MIPs were obtained to evaluate the vascular anatomy. CONTRAST:  80 mL of Isovue 370 IV contrast COMPARISON:  None. FINDINGS: Cardiovascular:  There is no evidence of pulmonary embolus. The heart is grossly unremarkable in appearance. The thoracic aorta is unremarkable. No calcific  atherosclerotic disease is seen. The great vessels are within normal limits. Mediastinum/Nodes: The mediastinum is unremarkable in appearance. No mediastinal lymphadenopathy is seen. No pericardial effusion is identified. The visualized portions of the thyroid gland are unremarkable. No axillary lymphadenopathy is appreciated. Lungs/Pleura: The lungs are clear bilaterally. No focal consolidation, pleural effusion or pneumothorax is seen. No masses are identified. Upper Abdomen: The visualized portions of the liver and spleen are grossly unremarkable. The visualized portions of the gallbladder, pancreas, adrenal glands and kidneys are within normal limits. Musculoskeletal: No acute osseous abnormalities are identified. The visualized musculature is unremarkable in appearance. Review of the MIP images confirms the above findings. IMPRESSION: 1. No evidence of pulmonary embolus. 2. Lungs clear bilaterally. Electronically Signed   By: Garald Balding M.D.   On: 11/28/2016 18:38    Procedures Procedures (including critical care time)  Medications Ordered in ED Medications  sodium chloride 0.9 % bolus 1,000 mL (0 mLs Intravenous Stopped 11/28/16 1830)  prochlorperazine (COMPAZINE) injection 10 mg (10 mg Intravenous Given 11/28/16 1730)  dexamethasone (DECADRON) injection 10 mg (10 mg Intravenous Given 11/28/16 1730)  diphenhydrAMINE (BENADRYL) injection 25 mg (25 mg Intravenous Given 11/28/16 1730)  iopamidol (ISOVUE-370) 76 % injection (80 mLs  Contrast Given 11/28/16 1812)  rivaroxaban [employer] Reasons) Education Kit for DVT/PE patients ( Does not apply Given 11/28/16 1933)  Rivaroxaban (XARELTO) tablet 15 mg (15 mg Oral Given 11/28/16 1933)     Initial Impression / Assessment and Plan / ED Course  I have reviewed the triage vital signs and the nursing notes.  Pertinent labs & imaging results that were available during my care of the patient were reviewed by me and considered in my medical decision making (see  chart for details).  Clinical Course     Vitals:   11/28/16 1700 11/28/16 1730 11/28/16 1950 11/28/16 2000  BP:  111/67 125/73 131/70  Pulse: 74 116 82 85  Resp: _0 Temp:   98.2 F (36.8 C)   TempSrc:   Oral   SpO2: 100% 100% 97% 100%    Medications  sodium chloride 0.9 % bolus 1,000 mL (0 mLs Intravenous Stopped 11/28/16 1830)  prochlorperazine (COMPAZINE) injection 10 mg (10 mg Intravenous Given 11/28/16 1730)  dexamethasone (DECADRON) injection 10 mg (10 mg Intravenous Given 11/28/16 1730)  diphenhydrAMINE (BENADRYL) injection 25 mg (25 mg Intravenous Given 11/28/16 1730)  iopamidol (ISOVUE-370) 76 % injection (80 mLs  Contrast Given 11/28/16 1812)  rivaroxaban (XARELTO) Education Kit for DVT/PE patients ( Does not apply Given 11/28/16 1933)  Rivaroxaban (XARELTO) tablet 15 mg (15 mg Oral Given 11/28/16 1933)    Claudia Miller is 25 y.o. female presenting with DVT, states that she was informed  by the health department she had a positive DVT via voicemail she has not started treatment. She also has headache, this is not atypical for her baseline but more severe, given the fact that we are going to start her on anticoagulation will obtain CT head, and reassured by her normal neurologic exam. She is also reporting some palpitations, will obtain CTA to rule out PE.  CT head and CT PA negative, she feels much improved after headache cocktail. I started her on Xarelto but she understands is critically important that she follows with her primary care doctor for follow-up, it is unclear what provoked the DVT. She doesn't have a family history of bleeding dyscrasia.  Evaluation does not show pathology that would require ongoing emergent intervention or inpatient treatment. Pt is hemodynamically stable and mentating appropriately. Discussed findings and plan with patient/guardian, who agrees with care plan. All questions answered. Return precautions discussed and outpatient follow up given.       Final Clinical Impressions(s) / ED Diagnoses   Final diagnoses:  Acute deep vein thrombosis (DVT) of proximal vein of left lower extremity ([place])    New Prescriptions Discharge Medication List as of 11/28/2016  7:52 PM    START taking these medications   Details  butalbital-acetaminophen-caffeine (FIORICET, ESGIC) 50-325-40 MG tablet Take 1 tablet by mouth every 6 (six) hours as needed for headache., Starting Sun 11/28/2016, Print    Rivaroxaban 15 & 20 MG TBPK Take as directed on package: Start with one 43m tablet by mouth twice a day with food. On Day 22, switch to one 29mtablet once a day with food., Print         NiHumePA-C 11/29/16 0133    ElDaleen BoMD 12/01/16 1016

## 2016-11-28 NOTE — ED Triage Notes (Signed)
Patient complains of headache with nausea that she reports as a coolness in her head. Was just diagnosed with DVT RLE on Friday and has started no treatment. No shortness of breath, NAD. Alert and oriented

## 2016-11-30 ENCOUNTER — Ambulatory Visit (INDEPENDENT_AMBULATORY_CARE_PROVIDER_SITE_OTHER): Payer: 59 | Admitting: Physician Assistant

## 2016-11-30 ENCOUNTER — Ambulatory Visit (INDEPENDENT_AMBULATORY_CARE_PROVIDER_SITE_OTHER): Payer: 59

## 2016-11-30 VITALS — BP 130/80 | HR 95 | Temp 98.5°F | Resp 17 | Ht 62.0 in | Wt 229.0 lb

## 2016-11-30 DIAGNOSIS — M79662 Pain in left lower leg: Secondary | ICD-10-CM

## 2016-11-30 MED ORDER — MELOXICAM 15 MG PO TABS: 7.5000 mg | ORAL_TABLET | Freq: Every day | ORAL | 0 refills | Status: AC

## 2016-11-30 NOTE — Patient Instructions (Signed)
     IF you received an x-ray today, you will receive an invoice from [facility]. Please contact [facility] at [phone] with questions or concerns regarding your invoice.   IF you received labwork today, you will receive an invoice from [facility]. Please contact [facility] at [phone] with questions or concerns regarding your invoice.   Our billing staff will not be able to assist you with questions regarding bills from these companies.  You will be contacted with the lab results as soon as they are available. The fastest way to get your results is to activate your My Chart account. Instructions are located on the last page of this paperwork. If you have not heard from us regarding the results in 2 weeks, please contact this office.      

## 2016-11-30 NOTE — Progress Notes (Signed)
11/30/2016 5:41 PM   DOB: September 09, 1991 / MRN: [id number]  SUBJECTIVE:  Claudia Miller is a 25 y.o. female presenting for continued left leg pain.  She was under the impression that she had a blood clot in her left calf.  I did that work up and she was in-fact NEGATIVE FOR DVT.  She went to the ED and told a provider there that she was diagnosed with a blood clot in her leg by the health department and the ED started her on Xarelto.   She is allergic to fish allergy.   She  has a past medical history of DVT (deep venous thrombosis) ([place]) and Medical history non-contributory.    She  reports that she has quit smoking. Her smoking use included Cigarettes. She smoked 0.00 packs per day. She has never used smokeless tobacco. She reports that she does not drink alcohol or use drugs. She  has no sexual activity history on file. The patient  has a past surgical history that includes No past surgeries.  Her family history includes Asthma in her brother, mother, and sister; Cancer in her father; Diabetes in her mother; Hypertension in her brother, father, mother, and sister.  Review of Systems  Musculoskeletal: Negative for joint pain and myalgias.    The problem list and medications were reviewed and updated by myself where necessary and exist elsewhere in the encounter.   OBJECTIVE:  BP 130/80 (BP Location: Right Arm, Patient Position: Sitting, Cuff Size: Large)   Pulse 95   Temp 98.5 F (36.9 C) (Oral)   Resp 17   Ht '5\' 2"'$  (1.575 m)   Wt 229 lb (103.9 kg)   LMP 11/26/2016 (Approximate)   SpO2 100%   BMI 41.88 kg/m   Physical Exam  Constitutional: She is oriented to person, place, and time.  Cardiovascular: Normal rate, regular rhythm and normal heart sounds.   Pulses:      Dorsalis pedis pulses are 2+ on the right side.       Posterior tibial pulses are 2+ on the right side.  Pulmonary/Chest: Effort normal and breath sounds normal.  Musculoskeletal: She exhibits tenderness (left  superior posterior calf, negative Homan's.  Negative for swelling.  DP and PT pulses 2+. ).  Neurological: She is alert and oriented to person, place, and time.  Skin: Skin is warm.    Results for orders placed or performed during the hospital encounter of 11/28/16 (from the past 72 hour(s))  CBC     Status: None   Collection Time: 11/28/16  5:13 PM  Result Value Ref Range   WBC 6.1 4.0 - 10.5 K/uL   RBC 4.39 3.87 - 5.11 MIL/uL   Hemoglobin 12.2 12.0 - 15.0 g/dL   HCT 37.5 36.0 - 46.0 %   MCV 85.4 78.0 - 100.0 fL   MCH 27.8 26.0 - 34.0 pg   MCHC 32.5 30.0 - 36.0 g/dL   RDW 13.6 11.5 - 15.5 %   Platelets 254 150 - 400 K/uL  Comprehensive metabolic panel     Status: Abnormal   Collection Time: 11/28/16  5:13 PM  Result Value Ref Range   Sodium 138 135 - 145 mmol/L   Potassium 3.8 3.5 - 5.1 mmol/L   Chloride 106 101 - 111 mmol/L   CO2 22 22 - 32 mmol/L   Glucose, Bld 108 (H) 65 - 99 mg/dL   BUN 13 6 - 20 mg/dL   Creatinine, Ser 0.63 0.44 - 1.00 mg/dL  Calcium 8.8 (L) 8.9 - 10.3 mg/dL   Total Protein 7.2 6.5 - 8.1 g/dL   Albumin 3.4 (L) 3.5 - 5.0 g/dL   AST 20 15 - 41 U/L   ALT 13 (L) 14 - 54 U/L   Alkaline Phosphatase 62 38 - 126 U/L   Total Bilirubin 0.2 (L) 0.3 - 1.2 mg/dL   GFR calc non Af Amer >60 >60 mL/min   GFR calc Af Amer >60 >60 mL/min    Comment: (NOTE) The eGFR has been calculated using the CKD EPI equation. This calculation has not been validated in all clinical situations. eGFR's persistently <60 mL/min signify possible Chronic Kidney Disease.    Anion gap 10 5 - 15  I-Stat Beta hCG blood, ED ([facility], [facility], [facility] only)     Status: None   Collection Time: 11/28/16  5:19 PM  Result Value Ref Range   I-stat hCG, quantitative <5.0 <5 mIU/mL   Comment 3            Comment:   GEST. AGE      CONC.  (mIU/mL)   <=1 WEEK        5 - 50     2 WEEKS       50 - 500     3 WEEKS       100 - 10,000     4 WEEKS     1,000 - 30,000        FEMALE AND NON-PREGNANT FEMALE:     LESS  THAN 5 mIU/mL     Dg Knee Complete 4 Views Left  Result Date: 11/30/2016 CLINICAL DATA:  Left knee pain over the last week. EXAM: LEFT KNEE - COMPLETE 4+ VIEW COMPARISON:  None. FINDINGS: No evidence of fracture, dislocation, or joint effusion. No evidence of arthropathy or other focal bone abnormality. Soft tissues are unremarkable. IMPRESSION: Normal radiographs Electronically Signed   By: Nelson Chimes M.D.   On: 11/30/2016 17:38    ASSESSMENT AND PLAN  Sawyer was seen today for follow-up.  Diagnoses and all orders for this visit:  Pain of left calf:  Old problem to me. She does not have a DVT.  Stopping Xarelto. Knee rads negative.  This is likely connective tissue inflammation.  Starting meloxicam.  -     DG Knee Complete 4 Views Left; Future    The patient is advised to call or return to clinic if she does not see an improvement in symptoms, or to seek the care of the closest emergency department if she worsens with the above plan.   Philis Fendt, MHS, PA-C [facility] 11/30/2016 5:41 PM

## 2016-12-06 MED FILL — MELOXICAM 15 MG TABLET: 15 | 30 days supply | Qty: 30 | Fill #0

## 2017-12-18 ENCOUNTER — Emergency Department (HOSPITAL_COMMUNITY)
Admission: EM | Admit: 2017-12-18 | Discharge: 2017-12-18 | Disposition: A | Discharge status: Home / Self Care | Payer: Medicaid Other | Attending: Emergency Medicine | Admitting: Emergency Medicine

## 2017-12-18 ENCOUNTER — Emergency Department (HOSPITAL_COMMUNITY): Payer: Medicaid Other

## 2017-12-18 ENCOUNTER — Encounter (HOSPITAL_COMMUNITY): Payer: Self-pay

## 2017-12-18 DIAGNOSIS — F17210 Nicotine dependence, cigarettes, uncomplicated: Secondary | ICD-10-CM | POA: Diagnosis not present

## 2017-12-18 DIAGNOSIS — Z791 Long term (current) use of non-steroidal anti-inflammatories (NSAID): Secondary | ICD-10-CM | POA: Insufficient documentation

## 2017-12-18 DIAGNOSIS — J069 Acute upper respiratory infection, unspecified: Secondary | ICD-10-CM

## 2017-12-18 DIAGNOSIS — J111 Influenza due to unidentified influenza virus with other respiratory manifestations: Secondary | ICD-10-CM | POA: Diagnosis not present

## 2017-12-18 DIAGNOSIS — R0981 Nasal congestion: Secondary | ICD-10-CM | POA: Diagnosis not present

## 2017-12-18 DIAGNOSIS — M791 Myalgia, unspecified site: Secondary | ICD-10-CM | POA: Diagnosis present

## 2017-12-18 DIAGNOSIS — Z79899 Other long term (current) drug therapy: Secondary | ICD-10-CM | POA: Diagnosis not present

## 2017-12-18 DIAGNOSIS — R69 Illness, unspecified: Secondary | ICD-10-CM

## 2017-12-18 MED ORDER — FLUTICASONE PROPIONATE 50 MCG/ACT NA SUSP: 1.0000 | Freq: Every day | NASAL | 2 refills | Status: DC

## 2017-12-18 MED ORDER — KETOROLAC TROMETHAMINE 60 MG/2ML IM SOLN
30.0000 mg | Freq: Once | INTRAMUSCULAR | Status: AC
  Administered 2017-12-18: 30 mg via INTRAMUSCULAR
  Filled 2017-12-18: qty 2

## 2017-12-18 MED ORDER — OSELTAMIVIR PHOSPHATE 75 MG PO CAPS: 75.0000 mg | ORAL_CAPSULE | Freq: Two times a day (BID) | ORAL | 0 refills | Status: DC

## 2017-12-18 MED ORDER — ONDANSETRON 4 MG PO TBDP: 4.0000 mg | ORAL_TABLET | Freq: Three times a day (TID) | ORAL | 0 refills | Status: DC | PRN

## 2017-12-18 MED ORDER — BENZONATATE 200 MG PO CAPS: 200.0000 mg | ORAL_CAPSULE | Freq: Three times a day (TID) | ORAL | 0 refills | Status: DC | PRN

## 2017-12-18 NOTE — Discharge Instructions (Signed)
Please read attached information regarding her condition. Take Zofran as needed for nausea, take Tessalon Perles as needed for cough, take Flonase as needed for nasal congestion.   Take Tamiflu for your flulike symptoms. Push fluids to maintain adequate hydration. Follow-up with your primary care provider for further evaluation. Return to ED for worsening symptoms, chest pain, severe abdominal pain, coughing up blood, trouble breathing.

## 2017-12-18 NOTE — ED Notes (Signed)
See EDP note for further assessment and evaluation. 

## 2017-12-18 NOTE — ED Triage Notes (Signed)
Onset 1 week chills, sweats, productive cough- yellow phlegm.  Cough interfering with sleep and usual activities.  Onset tonight while bathing pt started having generalized body aches.  No respiratory or swallowing difficulties.  Talking in complete sentences.  No one in household with same symptoms.

## 2017-12-18 NOTE — ED Provider Notes (Signed)
[facility]   CSN: [id number] Arrival date & time: 12/18/17  2036     History   Chief Complaint Chief Complaint  Patient presents with  . Cough  . Generalized Body Aches    HPI [name] is a 26 y.o. female who presents to ED for evaluation of 1 day history of generalized aches, productive cough, nausea, diarrhea, nasal congestion.  She works as a Leisure centre managerfood server so is unsure if she was exposed to any sick contacts.  She did receive her influenza vaccine this year.  She took 1 dose of Tylenol with no relief in her symptoms.  She denies any chest pain, hemoptysis, shortness of breath, wheezing, hematemesis, hematochezia, melena, abdominal pain.  HPI  Past Medical History:  Diagnosis Date  . DVT (deep venous thrombosis) (HCC)   . Medical history non-contributory     Patient Active Problem List   Diagnosis Date Noted  . Insufficient prenatal care in third trimester 07/27/2014  . Trichomonal vaginitis during pregnancy 07/27/2014  . Normal labor 07/27/2014  . Active labor 07/27/2014    Past Surgical History:  Procedure Laterality Date  . NO PAST SURGERIES      OB History    Gravida Para Term Preterm AB Living   2 2 2     2    SAB TAB Ectopic Multiple Live Births           2       Home Medications    Prior to Admission medications   Medication Sig Start Date End Date Taking? Authorizing Provider  benzonatate (TESSALON) 200 MG capsule Take 1 capsule (200 mg total) by mouth 3 (three) times daily as needed for cough. 12/18/17   Kani Chauvin, PA-C  butalbital-acetaminophen-caffeine (FIORICET, ESGIC) 50-325-40 MG tablet Take 1 tablet by mouth every 6 (six) hours as needed for headache. 11/28/16   Pisciotta, Joni ReiningNicole, PA-C  fluticasone (FLONASE) 50 MCG/ACT nasal spray Place 1 spray into both nostrils daily. 12/18/17   Kaysan Peixoto, PA-C  hydrocortisone (ANUSOL-HC) 25 MG suppository Place 1 suppository (25 mg total)  rectally 2 (two) times daily. 09/23/16   Bing NeighborsHarris, Kimberly S, FNP  ibuprofen (ADVIL,MOTRIN) 800 MG tablet Take 1 tablet (800 mg total) by mouth 3 (three) times daily. 12/10/15   Elpidio AnisUpstill, Shari, PA-C  ondansetron (ZOFRAN ODT) 4 MG disintegrating tablet Take 1 tablet (4 mg total) by mouth every 8 (eight) hours as needed for nausea or vomiting. 12/18/17   Reade Trefz, PA-C  oseltamivir (TAMIFLU) 75 MG capsule Take 1 capsule (75 mg total) by mouth every 12 (twelve) hours. 12/18/17   Dietrich PatesKhatri, Terel Bann, PA-C    Family History Family History  Problem Relation Age of Onset  . Asthma Mother   . Diabetes Mother   . Hypertension Mother   . Cancer Father   . Hypertension Father   . Asthma Sister   . Hypertension Sister   . Asthma Brother   . Hypertension Brother     Social History Social History   Tobacco Use  . Smoking status: Current Some Day Smoker    Packs/day: 0.00    Types: Cigarettes  . Smokeless tobacco: Never Used  Substance Use Topics  . Alcohol use: No  . Drug use: No     Allergies   Fish allergy   Review of Systems Review of Systems  Constitutional: Positive for chills and fatigue. Negative for fever.  HENT: Positive for congestion, rhinorrhea and sneezing. Negative for drooling, ear  discharge, ear pain, sinus pressure and trouble swallowing.   Respiratory: Positive for cough. Negative for shortness of breath.   Cardiovascular: Negative for chest pain.  Gastrointestinal: Positive for diarrhea and nausea. Negative for abdominal pain and vomiting.  Musculoskeletal: Positive for myalgias.  Skin: Negative for rash.  Neurological: Negative for weakness and numbness.     Physical Exam Updated Vital Signs BP 104/65 (BP Location: Right Arm)   Pulse 95   Temp 98.5 F (36.9 C) (Oral)   Resp 18   LMP 12/17/2017   SpO2 100%   Physical Exam  Constitutional: She appears well-developed and well-nourished. No distress.  HENT:  Head: Normocephalic and atraumatic.  Right Ear:  A middle ear effusion is present.  Left Ear: A middle ear effusion is present.  Nose: Rhinorrhea present.  Mouth/Throat: Uvula is midline. No posterior oropharyngeal edema or posterior oropharyngeal erythema. No tonsillar exudate.  Eyes: Conjunctivae and EOM are normal. No scleral icterus.  Neck: Normal range of motion.  Cardiovascular: Normal rate, regular rhythm and normal heart sounds.  Pulmonary/Chest: Effort normal and breath sounds normal. No respiratory distress. She has no wheezes.  Neurological: She is alert.  Skin: No rash noted. She is not diaphoretic.  Psychiatric: She has a normal mood and affect.  Nursing note and vitals reviewed.    ED Treatments / Results  Labs (all labs ordered are listed, but only abnormal results are displayed) Labs Reviewed - No data to display  EKG  EKG Interpretation None       Radiology Dg Chest 2 View  Result Date: 12/18/2017 CLINICAL DATA:  26 year old female with productive cough. EXAM: CHEST  2 VIEW COMPARISON:  Chest CT dated 11/28/2016 FINDINGS: The heart size and mediastinal contours are within normal limits. Both lungs are clear. The visualized skeletal structures are unremarkable. IMPRESSION: No active cardiopulmonary disease. Electronically Signed   By: Elgie Collard M.D.   On: 12/18/2017 21:22    Procedures Procedures (including critical care time)  Medications Ordered in ED Medications  ketorolac (TORADOL) injection 30 mg (not administered)     Initial Impression / Assessment and Plan / ED Course  I have reviewed the triage vital signs and the nursing notes.  Pertinent labs & imaging results that were available during my care of the patient were reviewed by me and considered in my medical decision making (see chart for details).     Patient presents to ED for evaluation of body aches, productive cough, nausea, diarrhea and nasal congestion since yesterday.  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  On physical exam she is overall well-appearing.  She denies any chest pain, shortness of breath, hemoptysis, blood in vomit or stool.  Her lungs are clear to auscultation bilaterally.  Oxygen saturations 100% on room air and heart rate is normal.  She is afebrile here.  I suspect that her symptoms are due to an influenza.  Will give symptomatic treatment as well as Tamiflu because she is in the window of treatment.  Did discuss the risk and benefits of Tamiflu treatment.  Advised her to follow-up with her primary care provider for further evaluation.  Patient appears stable for discharge at this time.  Strict return precautions given.  Final Clinical Impressions(s) / ED Diagnoses   Final diagnoses:  Influenza-like illness  Viral upper respiratory tract infection    ED Discharge Orders        Ordered    oseltamivir (TAMIFLU) 75  MG capsule  Every 12 hours     12/18/17 2145    benzonatate (TESSALON) 200 MG capsule  3 times daily PRN     12/18/17 2145    fluticasone (FLONASE) 50 MCG/ACT nasal spray  Daily     12/18/17 2145    ondansetron (ZOFRAN ODT) 4 MG disintegrating tablet  Every 8 hours PRN     12/18/17 2145     Portions of this note were generated with Dragon dictation software. Dictation errors may occur despite best attempts at proofreading.    Dietrich PatesKhatri, Louise Victory, PA-C 12/18/17 2155    Gerhard MunchLockwood, Robert, MD 12/18/17 [phone]

## 2018-06-04 ENCOUNTER — Encounter (HOSPITAL_COMMUNITY): Payer: Self-pay | Admitting: *Deleted

## 2018-06-04 ENCOUNTER — Inpatient Hospital Stay (HOSPITAL_COMMUNITY)
Admission: AD | Admit: 2018-06-04 | Discharge: 2018-06-04 | Disposition: A | Discharge status: Home / Self Care | Payer: Medicaid Other | Source: Ambulatory Visit | Attending: Obstetrics & Gynecology | Admitting: Obstetrics & Gynecology

## 2018-06-04 ENCOUNTER — Inpatient Hospital Stay (HOSPITAL_COMMUNITY): Payer: Medicaid Other

## 2018-06-04 DIAGNOSIS — O26891 Other specified pregnancy related conditions, first trimester: Secondary | ICD-10-CM

## 2018-06-04 DIAGNOSIS — R109 Unspecified abdominal pain: Secondary | ICD-10-CM | POA: Diagnosis not present

## 2018-06-04 DIAGNOSIS — A0811 Acute gastroenteropathy due to Norwalk agent: Secondary | ICD-10-CM | POA: Insufficient documentation

## 2018-06-04 DIAGNOSIS — O99611 Diseases of the digestive system complicating pregnancy, first trimester: Secondary | ICD-10-CM | POA: Insufficient documentation

## 2018-06-04 DIAGNOSIS — Z87891 Personal history of nicotine dependence: Secondary | ICD-10-CM | POA: Diagnosis not present

## 2018-06-04 DIAGNOSIS — Z3A01 Less than 8 weeks gestation of pregnancy: Secondary | ICD-10-CM

## 2018-06-04 DIAGNOSIS — Z3A08 8 weeks gestation of pregnancy: Secondary | ICD-10-CM

## 2018-06-04 DIAGNOSIS — O210 Mild hyperemesis gravidarum: Secondary | ICD-10-CM | POA: Diagnosis present

## 2018-06-04 LAB — CBC WITH DIFFERENTIAL/PLATELET
BASOS ABS: 0 10*3/uL (ref 0.0–0.1)
BASOS PCT: 0 %
EOS PCT: 1 %
Eosinophils Absolute: 0 10*3/uL (ref 0.0–0.7)
HCT: 38.3 % (ref 36.0–46.0)
Hemoglobin: 12.9 g/dL (ref 12.0–15.0)
LYMPHS PCT: 30 %
Lymphs Abs: 1.2 10*3/uL (ref 0.7–4.0)
MCH: 29.5 pg (ref 26.0–34.0)
MCHC: 33.7 g/dL (ref 30.0–36.0)
MCV: 87.4 fL (ref 78.0–100.0)
MONO ABS: 0.3 10*3/uL (ref 0.1–1.0)
Monocytes Relative: 8 %
NEUTROS ABS: 2.6 10*3/uL (ref 1.7–7.7)
Neutrophils Relative %: 63 %
PLATELETS: 204 10*3/uL (ref 150–400)
RBC: 4.38 MIL/uL (ref 3.87–5.11)
RDW: 12.4 % (ref 11.5–15.5)
WBC: 4.1 10*3/uL (ref 4.0–10.5)

## 2018-06-04 LAB — URINALYSIS, ROUTINE W REFLEX MICROSCOPIC
BACTERIA UA: NONE SEEN
BILIRUBIN URINE: NEGATIVE
Glucose, UA: NEGATIVE mg/dL
HGB URINE DIPSTICK: NEGATIVE
Ketones, ur: 80 mg/dL — AB
Nitrite: NEGATIVE
PROTEIN: NEGATIVE mg/dL
SPECIFIC GRAVITY, URINE: 1.02 (ref 1.005–1.030)
pH: 6 (ref 5.0–8.0)

## 2018-06-04 LAB — COMPREHENSIVE METABOLIC PANEL
ALBUMIN: 3.9 g/dL (ref 3.5–5.0)
ALT: 10 U/L — AB (ref 14–54)
AST: 14 U/L — AB (ref 15–41)
Alkaline Phosphatase: 54 U/L (ref 38–126)
Anion gap: 11 (ref 5–15)
BUN: 6 mg/dL (ref 6–20)
CHLORIDE: 104 mmol/L (ref 101–111)
CO2: 20 mmol/L — AB (ref 22–32)
CREATININE: 0.53 mg/dL (ref 0.44–1.00)
Calcium: 9 mg/dL (ref 8.9–10.3)
GFR calc Af Amer: >60 mL/min (ref >60)
GLUCOSE: 75 mg/dL (ref 65–99)
POTASSIUM: 3.9 mmol/L (ref 3.5–5.1)
Sodium: 135 mmol/L (ref 135–145)
Total Bilirubin: 0.4 mg/dL (ref 0.3–1.2)
Total Protein: 8.3 g/dL — ABNORMAL HIGH (ref 6.5–8.1)

## 2018-06-04 LAB — ABO/RH: ABO/RH(D): A POS

## 2018-06-04 LAB — HCG, QUANTITATIVE, PREGNANCY: hCG, Beta Chain, Quant, S: 62793 m[IU]/mL — ABNORMAL HIGH (ref <5)

## 2018-06-04 LAB — POCT PREGNANCY, URINE: PREG TEST UR: POSITIVE — AB

## 2018-06-04 MED ORDER — DEXTROSE 5 % IN LACTATED RINGERS IV BOLUS
1000.0000 mL | Freq: Once | INTRAVENOUS | Status: AC
  Administered 2018-06-04: 1000 mL via INTRAVENOUS

## 2018-06-04 MED ORDER — ONDANSETRON 8 MG PO TBDP: 8.0000 mg | ORAL_TABLET | Freq: Three times a day (TID) | ORAL | 0 refills | Status: DC | PRN

## 2018-06-04 MED ORDER — PROMETHAZINE HCL 12.5 MG PO TABS: 12.5000 mg | ORAL_TABLET | Freq: Four times a day (QID) | ORAL | 0 refills | Status: DC | PRN

## 2018-06-04 MED ORDER — SODIUM CHLORIDE 0.9 % IV SOLN
8.0000 mg | Freq: Once | INTRAVENOUS | Status: AC
  Administered 2018-06-04: 8 mg via INTRAVENOUS
  Filled 2018-06-04: qty 4

## 2018-06-04 MED ORDER — FAMOTIDINE IN NACL 20-0.9 MG/50ML-% IV SOLN
20.0000 mg | Freq: Once | INTRAVENOUS | Status: AC
  Administered 2018-06-04: 20 mg via INTRAVENOUS
  Filled 2018-06-04: qty 50

## 2018-06-04 MED ORDER — LACTATED RINGERS IV BOLUS
1000.0000 mL | Freq: Once | INTRAVENOUS | Status: AC
  Administered 2018-06-04: 1000 mL via INTRAVENOUS

## 2018-06-04 NOTE — MAU Provider Note (Signed)
History     CSN: [id number]  Arrival date and time: 06/04/18 1356   First Provider Initiated Contact with Patient 06/04/18 1548      Chief Complaint  Patient presents with  . Emesis  . Abdominal Pain   HPI   [name] is a 27 y.o. female [id number]  @ 7w5dhere with N/V/D. Symptoms started all of a sudden on Thursday evening. No one else in her house is sick. Says she had 4 episodes of liquid diarrhea this morning, and has vomited around 5-10 times. She is also experiencing lower abdominal pain that started around the same time. The pain comes and goes. The pain is cramping like. Has not taken anything for the pain.   OB History    Gravida  3   Para  2   Term  2   Preterm      AB      Living  2     SAB      TAB      Ectopic      Multiple      Live Births  2           Past Medical History:  Diagnosis Date  . [place]   . Medical history non-contributory     Past Surgical History:  Procedure Laterality Date  . NO PAST SURGERIES      Family History  Problem Relation Age of Onset  . Asthma Mother   . Diabetes Mother   . Hypertension Mother   . Cancer Father   . Hypertension Father   . Asthma Sister   . Hypertension Sister   . Asthma Brother   . Hypertension Brother     Social History   Tobacco Use  . Smoking status: Former Smoker    Packs/day: 0.00    Types: Cigarettes    Last attempt to quit: 05/31/2018    Years since quitting: 0.0  . Smokeless tobacco: Never Used  Substance Use Topics  . Alcohol use: No  . Drug use: No    Allergies:  Allergies  Allergen Reactions  . Fish Allergy Anaphylaxis    Medications Prior to Admission  Medication Sig Dispense Refill Last Dose  . benzonatate (TESSALON) 200 MG capsule Take 1 capsule (200 mg total) by mouth 3 (three) times daily as needed for cough. 20 capsule 0   . butalbital-acetaminophen-caffeine (FIORICET, ESGIC) 50-325-40 MG tablet Take 1 tablet by mouth  every 6 (six) hours as needed for headache. 20 tablet 0 Taking  . fluticasone (FLONASE) 50 MCG/ACT nasal spray Place 1 spray into both nostrils daily. 16 g 2   . hydrocortisone (ANUSOL-HC) 25 MG suppository Place 1 suppository (25 mg total) rectally 2 (two) times daily. 12 suppository 0 Taking  . ibuprofen (ADVIL,MOTRIN) 800 MG tablet Take 1 tablet (800 mg total) by mouth 3 (three) times daily. 21 tablet 0 Taking  . ondansetron (ZOFRAN ODT) 4 MG disintegrating tablet Take 1 tablet (4 mg total) by mouth every 8 (eight) hours as needed for nausea or vomiting. 20 tablet 0   . oseltamivir (TAMIFLU) 75 MG capsule Take 1 capsule (75 mg total) by mouth every 12 (twelve) hours. 10 capsule 0    Results for orders placed or performed during the hospital encounter of 06/04/18 (from the past 48 hour(s))  Urinalysis, Routine w reflex microscopic     Status: Abnormal   Collection Time: 06/04/18  2:49 PM  Result Value Ref Range  Color, Urine YELLOW YELLOW   APPearance CLEAR CLEAR   Specific Gravity, Urine 1.020 1.005 - 1.030   pH 6.0 5.0 - 8.0   Glucose, UA NEGATIVE NEGATIVE mg/dL   Hgb urine dipstick NEGATIVE NEGATIVE   Bilirubin Urine NEGATIVE NEGATIVE   Ketones, ur 80 (A) NEGATIVE mg/dL   Protein, ur NEGATIVE NEGATIVE mg/dL   Nitrite NEGATIVE NEGATIVE   Leukocytes, UA SMALL (A) NEGATIVE   RBC / HPF 11-20 0 - 5 RBC/hpf   WBC, UA 21-50 0 - 5 WBC/hpf   Bacteria, UA NONE SEEN NONE SEEN   Squamous Epithelial / LPF 0-5 0 - 5   Mucus PRESENT     Comment: Performed at [facility], [address], [place], [place] [address]  Pregnancy, urine POC     Status: Abnormal   Collection Time: 06/04/18  2:54 PM  Result Value Ref Range   Preg Test, Ur POSITIVE (A) NEGATIVE    Comment:        THE SENSITIVITY OF THIS METHODOLOGY IS >24 mIU/mL   CBC with Differential/Platelet     Status: None   Collection Time: 06/04/18  4:35 PM  Result Value Ref Range   WBC 4.1 4.0 - 10.5 K/uL   RBC 4.38 3.87 - 5.11  MIL/uL   Hemoglobin 12.9 12.0 - 15.0 g/dL   HCT 38.3 36.0 - 46.0 %   MCV 87.4 78.0 - 100.0 fL   MCH 29.5 26.0 - 34.0 pg   MCHC 33.7 30.0 - 36.0 g/dL   RDW 12.4 11.5 - 15.5 %   Platelets 204 150 - 400 K/uL   Neutrophils Relative % 63 %   Neutro Abs 2.6 1.7 - 7.7 K/uL   Lymphocytes Relative 30 %   Lymphs Abs 1.2 0.7 - 4.0 K/uL   Monocytes Relative 8 %   Monocytes Absolute 0.3 0.1 - 1.0 K/uL   Eosinophils Relative 1 %   Eosinophils Absolute 0.0 0.0 - 0.7 K/uL   Basophils Relative 0 %   Basophils Absolute 0.0 0.0 - 0.1 K/uL    Comment: Performed at [facility], [address], [place], [place] [address]  Comprehensive metabolic panel     Status: Abnormal   Collection Time: 06/04/18  4:35 PM  Result Value Ref Range   Sodium 135 135 - 145 mmol/L   Potassium 3.9 3.5 - 5.1 mmol/L   Chloride 104 101 - 111 mmol/L   CO2 20 (L) 22 - 32 mmol/L   Glucose, Bld 75 65 - 99 mg/dL   BUN 6 6 - 20 mg/dL   Creatinine, Ser 0.53 0.44 - 1.00 mg/dL   Calcium 9.0 8.9 - 10.3 mg/dL   Total Protein 8.3 (H) 6.5 - 8.1 g/dL   Albumin 3.9 3.5 - 5.0 g/dL   AST 14 (L) 15 - 41 U/L   ALT 10 (L) 14 - 54 U/L   Alkaline Phosphatase 54 38 - 126 U/L   Total Bilirubin 0.4 0.3 - 1.2 mg/dL   GFR calc non Af Amer >60 >60 mL/min   GFR calc Af Amer >60 >60 mL/min    Comment: (NOTE) The eGFR has been calculated using the CKD EPI equation. This calculation has not been validated in all clinical situations. eGFR's persistently <60 mL/min signify possible Chronic Kidney Disease.    Anion gap 11 5 - 15    Comment: Performed at [facility], [address], [place], [place] [address]  ABO/Rh     Status: None   Collection Time:  06/04/18  4:35 PM  Result Value Ref Range   ABO/RH(D)      A POS Performed at [facility], [address] [place], [place] [address]   hCG, quantitative, pregnancy     Status: Abnormal   Collection Time: 06/04/18  4:35 PM  Result Value Ref Range   hCG, Beta Chain, Quant, S  62,793 (H) <5 mIU/mL    Comment:          GEST. AGE      CONC.  (mIU/mL)   <=1 WEEK        5 - 50     2 WEEKS       50 - 500     3 WEEKS       100 - 10,000     4 WEEKS     1,000 - 30,000     5 WEEKS     3,500 - 115,000   6-8 WEEKS     12,000 - 270,000    12 WEEKS     15,000 - 220,000        FEMALE AND NON-PREGNANT FEMALE:     LESS THAN 5 mIU/mL Performed at [facility], [address] [place], [place] [address]    US Ob Less Than 14 Weeks With Ob Transvaginal  Result Date: 06/04/2018 CLINICAL DATA:  Abdominal pain and nausea and vomiting. Unsure of LMP. EXAM: OBSTETRIC <14 WK [place] AND TRANSVAGINAL OB US TECHNIQUE: Both transabdominal and transvaginal ultrasound examinations were performed for complete evaluation of the gestation as well as the maternal uterus, adnexal regions, and pelvic cul-de-sac. Transvaginal technique was performed to assess early pregnancy. COMPARISON:  None. FINDINGS: Intrauterine gestational sac: Single Yolk sac:  Visualized. Embryo:  Visualized. Cardiac Activity: Visualized. Heart Rate: 126 bpm CRL:  8 mm   6 w   4 d                  [place] EDC: 01/24/2019 Subchorionic hemorrhage:  None visualized. Maternal uterus/adnexae: Small right ovarian corpus luteum cyst noted. Normal appearance of left ovary. No adnexal mass or abnormal free fluid identified. IMPRESSION: Single living IUP measuring 6 weeks 4 days, with [place] EDC of 01/24/2019. No significant maternal uterine or adnexal abnormality identified. Electronically Signed   By: Earle Gell M.D.   On: 06/04/2018 18:18   Review of Systems  Constitutional: Negative for fever.  Gastrointestinal: Positive for abdominal pain, diarrhea, nausea and vomiting.  Genitourinary: Negative for vaginal bleeding and vaginal discharge.   Physical Exam   Blood pressure 106/60, pulse 95, temperature 98.1 F (36.7 C), temperature source Oral, resp. rate 16, weight 177 lb 1.9 oz (80.3 kg), last menstrual period 04/04/2018, SpO2 99 %, unknown  if currently breastfeeding.  Physical Exam  Constitutional: She is oriented to person, place, and time. She appears well-developed and well-nourished. No distress.  HENT:  Head: Normocephalic.  Eyes: Pupils are equal, round, and reactive to light.  Respiratory: Effort normal.  GI: Soft. She exhibits no distension and no mass. There is no tenderness. There is no rebound and no guarding.  Musculoskeletal: Normal range of motion.  Neurological: She is alert and oriented to person, place, and time.  Skin: Skin is warm. She is not diaphoretic.  Psychiatric: Her behavior is normal.   MAU Course  Procedures  None  MDM  D5LR Bolus X 1 LR bolus X 1 Zofran 8 mg IV & Pepcid IV Oral challenge: none per RN   Assessment and Plan  A:  1. Gastroenteritis due to norovirus   2. [redacted] weeks gestation of pregnancy   3. Abdominal pain   4. [redacted] weeks gestation of pregnancy   5. Abdominal pain during pregnancy in first trimester     P:  Discharge home in stable condition BRAT diet RX: Phenergan, Zofran Return to MAU if symptoms worsen Small, frequent meals Increase oral fluid intake Start prenatal care.   Noni Saupe I, NP 06/04/2018 7:17 PM

## 2018-06-04 NOTE — MAU Note (Signed)
Patient c/o  +N/V/D Started Thursday night States unable to keep anything down Emesis last night; dry heaves today Diarrhea x4   + lower abdominal pain  Discomfort Rating pain 8/10 Has not tried anything for it  LMP in beginning of April; patient cant remember the date Has not taken HPT

## 2018-06-06 LAB — CULTURE, OB URINE: SPECIAL REQUESTS: NORMAL

## 2018-06-24 ENCOUNTER — Inpatient Hospital Stay (HOSPITAL_COMMUNITY)
Admission: AD | Admit: 2018-06-24 | Discharge: 2018-06-24 | Disposition: A | Discharge status: Home / Self Care | Payer: Medicaid Other | Source: Ambulatory Visit | Attending: Obstetrics and Gynecology | Admitting: Obstetrics and Gynecology

## 2018-06-24 ENCOUNTER — Other Ambulatory Visit: Payer: Self-pay

## 2018-06-24 ENCOUNTER — Encounter (HOSPITAL_COMMUNITY): Payer: Self-pay

## 2018-06-24 DIAGNOSIS — Z825 Family history of asthma and other chronic lower respiratory diseases: Secondary | ICD-10-CM | POA: Diagnosis not present

## 2018-06-24 DIAGNOSIS — Z87891 Personal history of nicotine dependence: Secondary | ICD-10-CM | POA: Diagnosis not present

## 2018-06-24 DIAGNOSIS — Z833 Family history of diabetes mellitus: Secondary | ICD-10-CM | POA: Diagnosis not present

## 2018-06-24 DIAGNOSIS — G44209 Tension-type headache, unspecified, not intractable: Secondary | ICD-10-CM

## 2018-06-24 DIAGNOSIS — Z86718 Personal history of other venous thrombosis and embolism: Secondary | ICD-10-CM | POA: Insufficient documentation

## 2018-06-24 DIAGNOSIS — Z809 Family history of malignant neoplasm, unspecified: Secondary | ICD-10-CM | POA: Diagnosis not present

## 2018-06-24 DIAGNOSIS — O99281 Endocrine, nutritional and metabolic diseases complicating pregnancy, first trimester: Secondary | ICD-10-CM | POA: Diagnosis not present

## 2018-06-24 DIAGNOSIS — Z8249 Family history of ischemic heart disease and other diseases of the circulatory system: Secondary | ICD-10-CM | POA: Diagnosis not present

## 2018-06-24 DIAGNOSIS — E860 Dehydration: Secondary | ICD-10-CM

## 2018-06-24 DIAGNOSIS — O219 Vomiting of pregnancy, unspecified: Secondary | ICD-10-CM | POA: Diagnosis not present

## 2018-06-24 DIAGNOSIS — Z91013 Allergy to seafood: Secondary | ICD-10-CM | POA: Diagnosis not present

## 2018-06-24 DIAGNOSIS — Z3A09 9 weeks gestation of pregnancy: Secondary | ICD-10-CM | POA: Insufficient documentation

## 2018-06-24 LAB — URINALYSIS, ROUTINE W REFLEX MICROSCOPIC
Bilirubin Urine: NEGATIVE
Glucose, UA: NEGATIVE mg/dL
HGB URINE DIPSTICK: NEGATIVE
Ketones, ur: 80 mg/dL — AB
NITRITE: NEGATIVE
PROTEIN: 30 mg/dL — AB
Specific Gravity, Urine: 1.025 (ref 1.005–1.030)
pH: 6 (ref 5.0–8.0)

## 2018-06-24 MED ORDER — ONDANSETRON 8 MG PO TBDP
8.0000 mg | ORAL_TABLET | Freq: Once | ORAL | Status: AC
  Administered 2018-06-24: 8 mg via ORAL
  Filled 2018-06-24: qty 1

## 2018-06-24 MED ORDER — METOCLOPRAMIDE HCL 10 MG PO TABS
10.0000 mg | ORAL_TABLET | Freq: Once | ORAL | Status: AC
  Administered 2018-06-24: 10 mg via ORAL
  Filled 2018-06-24: qty 1

## 2018-06-24 MED ORDER — LACTATED RINGERS IV BOLUS
1000.0000 mL | Freq: Once | INTRAVENOUS | Status: AC
  Administered 2018-06-24: 1000 mL via INTRAVENOUS

## 2018-06-24 MED ORDER — FENTANYL CITRATE (PF) 100 MCG/2ML IJ SOLN
50.0000 ug | Freq: Once | INTRAMUSCULAR | Status: AC
  Administered 2018-06-24: 50 ug via INTRAVENOUS
  Filled 2018-06-24: qty 2

## 2018-06-24 MED ORDER — PROMETHAZINE HCL 12.5 MG PO TABS: 25.0000 mg | ORAL_TABLET | Freq: Four times a day (QID) | ORAL | 0 refills | Status: DC | PRN

## 2018-06-24 MED ORDER — DEXTROSE IN LACTATED RINGERS 5 % IV SOLN
Freq: Once | INTRAVENOUS | Status: AC
  Administered 2018-06-24: 20:00:00 via INTRAVENOUS
  Filled 2018-06-24: qty 1000

## 2018-06-24 MED ORDER — METOCLOPRAMIDE HCL 10 MG PO TABS: 10.0000 mg | ORAL_TABLET | Freq: Four times a day (QID) | ORAL | 1 refills | Status: DC | PRN

## 2018-06-24 NOTE — MAU Provider Note (Addendum)
History   [name] is a 27 year old G3 P2-0-0-2 at 9 weeks and 3 days per last menstrual period who is in today with nausea vomiting and headache she states she has not been able to keep anything down for days.  She also complains of a headache.  She has not started prenatal care yet.   CSN: [id number]  Arrival date & time 06/24/18  1849   None     Chief Complaint  Patient presents with  . Emesis  . Headache    HPI  Past Medical History:  Diagnosis Date  . DVT (deep venous thrombosis) (HCC)     Past Surgical History:  Procedure Laterality Date  . NO PAST SURGERIES      Family History  Problem Relation Age of Onset  . Asthma Mother   . Diabetes Mother   . Hypertension Mother   . Cancer Father   . Hypertension Father   . Asthma Sister   . Hypertension Sister   . Asthma Brother   . Hypertension Brother     Social History   Tobacco Use  . Smoking status: Former Smoker    Packs/day: 0.00    Types: Cigarettes    Last attempt to quit: 05/31/2018    Years since quitting: 0.0  . Smokeless tobacco: Never Used  Substance Use Topics  . Alcohol use: No  . Drug use: No    OB History    Gravida  3   Para  2   Term  2   Preterm      AB      Living  2     SAB      TAB      Ectopic      Multiple      Live Births  2           Review of Systems  Constitutional: Negative.   HENT: Negative.   Eyes: Negative.   Respiratory: Negative.   Cardiovascular: Negative.   Gastrointestinal: Positive for nausea and vomiting.  Endocrine: Negative.   Genitourinary: Negative.   Musculoskeletal: Negative.   Skin: Negative.   Allergic/Immunologic: Negative.   Neurological: Positive for headaches.  Hematological: Negative.   Psychiatric/Behavioral: Negative.     Allergies  Fish allergy  Home Medications    BP (!) 108/51 (BP Location: Right Arm)   Pulse 69   Temp 98.9 F (37.2 C) (Oral)   Resp 17   Wt 172 lb 0.6 oz (78 kg)   LMP 04/04/2018   SpO2  96% Comment: ra  BMI 31.47 kg/m   Physical Exam  Constitutional: She is oriented to person, place, and time. She appears well-developed and well-nourished.  HENT:  Head: Normocephalic.  Neck: Normal range of motion.  Cardiovascular: Normal rate, regular rhythm, normal heart sounds and intact distal pulses.  Pulmonary/Chest: Effort normal and breath sounds normal.  Abdominal: Soft. Bowel sounds are normal.  Musculoskeletal: Normal range of motion.  Neurological: She is alert and oriented to person, place, and time.  Skin: Skin is warm and dry.  Psychiatric: She has a normal mood and affect. Her behavior is normal.    MAU Course  Procedures (including critical care time)  Labs Reviewed  URINALYSIS, ROUTINE W REFLEX MICROSCOPIC - Abnormal; Notable for the following components:      Result Value   APPearance CLOUDY (*)    Ketones, ur 80 (*)    Protein, ur 30 (*)    Leukocytes, UA LARGE (*)  WBC, UA >50 (*)    Bacteria, UA RARE (*)    All other components within normal limits   No results found. Results for orders placed or performed during the hospital encounter of 06/24/18 (from the past 24 hour(s))  Urinalysis, Routine w reflex microscopic     Status: Abnormal   Collection Time: 06/24/18  6:49 PM  Result Value Ref Range   Color, Urine YELLOW YELLOW   APPearance CLOUDY (A) CLEAR   Specific Gravity, Urine 1.025 1.005 - 1.030   pH 6.0 5.0 - 8.0   Glucose, UA NEGATIVE NEGATIVE mg/dL   Hgb urine dipstick NEGATIVE NEGATIVE   Bilirubin Urine NEGATIVE NEGATIVE   Ketones, ur 80 (A) NEGATIVE mg/dL   Protein, ur 30 (A) NEGATIVE mg/dL   Nitrite NEGATIVE NEGATIVE   Leukocytes, UA LARGE (A) NEGATIVE   RBC / HPF 11-20 0 - 5 RBC/hpf   WBC, UA >50 (H) 0 - 5 WBC/hpf   Bacteria, UA RARE (A) NONE SEEN   Squamous Epithelial / LPF 21-50 0 - 5   Mucus PRESENT     Pt care turned over to V. Lovely Kerins CNM.   MDM  VSS, urine 80 ketones. Will IV hydrate, zofran and fentanyl for  headache.  No vomiting while on maternity admissions but still having nausea after Zofran.  Headache feeling better from IV fluids and fentanyl.  States Phenergan does not help with her nausea and vomiting.  Will give Reglan p.o.  No vomiting.  Will discharge home with Rx Reglan.  May continue Zofran as well.  List of OB providers given.  Instructed to start prenatal care soon as possible.  ASSESSMENT Nausea and vomiting of pregnancy Dehydration Non-intractable headache  PLAN Discharge home in stable condition. Encouraged to have small, frequent snacks and drinks. Tylenol as needed for headache. Rx Reglan, continue Zofran as needed for nausea and vomiting. Follow-up Information    Obstetrician of your choice Follow up in 2 week(s).   Why:  Start prenatal care       [facility] [place] MATERNITY ADMISSIONS Follow up.   Why:  As needed in pregnancy emergencies Contact information: [address] [id number] [place] [address] [phone]         Allergies as of 06/24/2018      Reactions   Fish Allergy Anaphylaxis      Medication List    TAKE these medications   butalbital-acetaminophen-caffeine 50-325-40 MG tablet Commonly known as:  FIORICET, ESGIC Take 1 tablet by mouth every 6 (six) hours as needed for headache.   metoCLOPramide 10 MG tablet Commonly known as:  REGLAN Take 1 tablet (10 mg total) by mouth every 6 (six) hours as needed for nausea or vomiting. Do not take within 6 hours of Phenergan   ondansetron 8 MG disintegrating tablet Commonly known as:  ZOFRAN ODT Take 1 tablet (8 mg total) by mouth every 8 (eight) hours as needed for nausea or vomiting.   promethazine 12.5 MG tablet Commonly known as:  PHENERGAN Take 2 tablets (25 mg total) by mouth every 6 (six) hours as needed for nausea or vomiting. What changed:  how much to take      Claudia Miller [place] 06/24/2018 10:08 PM

## 2018-06-24 NOTE — MAU Note (Signed)
+  headaches Rating pain 9/10 +changes in vision seeing black dots Started today  +N/V Ongoing Emesis x3 today Has tried taking medication she was given but says it doesn't give relief and causes headaches

## 2018-06-24 NOTE — Discharge Instructions (Signed)
[facility]  ° ° °[facility]       Phone: [phone] ° °[facility] Phone: [phone] ° °[facility] [place]  Phone: [phone] ° °[facility] [place]  Phone: [phone] ° °[facility] [place]  Phone: [phone] ° °[facility]       Phone: [phone] ° °[facility]    Phone: [phone]  ° °[facility]    Phone: [phone] ° °[facility]    Phone: [phone] ° °[facility]    Phone: [phone] ° °[facility]    Phone: [phone] ° °[facility]       Phone: [phone]  ° °[facility]  Phone: [phone] ° °[facility]    Phone: [phone] ° °[facility]   Phone: [phone] ° °[facility]      Phone: [phone] ° °[facility]    Phone: [phone] ° ° ° °Morning Sickness °Morning sickness is when you feel sick to your stomach (nauseous) during pregnancy. This nauseous feeling may or may not come with vomiting. It often occurs in the morning but can be a problem any time of day. Morning sickness is most common during the first trimester, but it may continue throughout pregnancy. While morning sickness is unpleasant, it is usually harmless unless you develop severe and continual vomiting (hyperemesis gravidarum). This condition requires more intense treatment. °What are the causes? °The cause of morning sickness is not completely known but seems to be related to normal hormonal changes that occur in pregnancy. °What increases the risk? °You are at greater risk if you: °· Experienced nausea or vomiting before your pregnancy. °· Had morning sickness during a  previous pregnancy. °· Are pregnant with more than one baby, such as twins. ° °How is this treated? °Do not use any medicines (prescription, over-the-counter, or herbal) for morning sickness without first talking to your health care provider. Your health care provider may prescribe or recommend: °· Vitamin B6 supplements. °· Anti-nausea medicines. °· The herbal medicine ginger. ° °Follow these instructions at home: °· Only take over-the-counter or prescription medicines as directed by your health care provider. °· Taking multivitamins before getting pregnant can prevent or decrease the severity of morning sickness in most women. °· Eat a piece of dry toast or unsalted crackers before getting out of bed in the morning. °· Eat five or six small meals a day. °· Eat dry and bland foods (rice, baked potato). Foods high in carbohydrates are often helpful. °· Do not drink liquids with your meals. Drink liquids between meals. °· Avoid greasy, fatty, and spicy foods. °· Get someone to cook for you if the smell of any food causes nausea and vomiting. °· If you feel nauseous after taking prenatal vitamins, take the vitamins at night or with a snack. °· Snack on protein foods (nuts, yogurt, cheese) between meals if you are hungry. °· Eat unsweetened gelatins for desserts. °· Wearing an acupressure wristband (worn for sea sickness) may be helpful. °· Acupuncture may be helpful. °· Do not smoke. °· Get a humidifier to keep the air in your house free of odors. °· Get plenty of fresh air. °Contact a health care provider if: °· Your home remedies are not working, and you need medicine. °· You feel dizzy or   lightheaded. °· You are losing weight. °Get help right away if: °· You have persistent and uncontrolled nausea and vomiting. °· You pass out (faint). °This information is not intended to replace advice given to you by your health care provider. Make sure you discuss any questions you have with your health care provider. °Document  Released: 02/03/2007 Document Revised: 05/20/2016 Document Reviewed: 05/30/2013 °Elsevier Interactive Patient Education © 2017 Elsevier Inc. ° °

## 2018-06-24 NOTE — Progress Notes (Addendum)
G3P2 here for n/v and seeing black spots when throwing up x3 that started today. Denies bleeding.   1930 Provider at bs assessing pt.   IV started with LR bolus infusing  2022: medicated per order. Multivit bag up.   2031: provider at bs reassessing pt. New orders received to PO challenge pt once multivit bag of fluid completed. Pt states nausea better.  2037 Gingerale provided and left on the side of bed for pt to drink   2105: Pt states not tolerating gingerale so water given. Provider made aware.  2115: reglan administered. Pt tolerating H2O  2117: assisted pt up to bathroom.   2152: IV d/c'd.   2214: D/c instructions given with pt understanding. Pt is being picked up by aunt. Pt wheel chaired to car.

## 2018-07-04 ENCOUNTER — Inpatient Hospital Stay (HOSPITAL_COMMUNITY)
Admission: AD | Admit: 2018-07-04 | Discharge: 2018-07-04 | Disposition: A | Discharge status: Home / Self Care | Payer: Medicaid Other | Source: Ambulatory Visit | Attending: Family Medicine | Admitting: Family Medicine

## 2018-07-04 ENCOUNTER — Encounter (HOSPITAL_COMMUNITY): Payer: Self-pay

## 2018-07-04 DIAGNOSIS — O210 Mild hyperemesis gravidarum: Secondary | ICD-10-CM | POA: Diagnosis not present

## 2018-07-04 DIAGNOSIS — Z87891 Personal history of nicotine dependence: Secondary | ICD-10-CM | POA: Insufficient documentation

## 2018-07-04 DIAGNOSIS — O219 Vomiting of pregnancy, unspecified: Secondary | ICD-10-CM | POA: Diagnosis not present

## 2018-07-04 DIAGNOSIS — Z3A10 10 weeks gestation of pregnancy: Secondary | ICD-10-CM | POA: Diagnosis not present

## 2018-07-04 LAB — URINALYSIS, ROUTINE W REFLEX MICROSCOPIC
BILIRUBIN URINE: NEGATIVE
GLUCOSE, UA: NEGATIVE mg/dL
Hgb urine dipstick: NEGATIVE
KETONES UR: 80 mg/dL — AB
NITRITE: NEGATIVE
PROTEIN: 100 mg/dL — AB
Specific Gravity, Urine: 1.03 (ref 1.005–1.030)
Squamous Epithelial / LPF: >50 — ABNORMAL HIGH (ref 0–5)
pH: 5 (ref 5.0–8.0)

## 2018-07-04 LAB — COMPREHENSIVE METABOLIC PANEL
ALT: 34 U/L (ref 0–44)
ANION GAP: 13 (ref 5–15)
AST: 28 U/L (ref 15–41)
Albumin: 3.7 g/dL (ref 3.5–5.0)
Alkaline Phosphatase: 55 U/L (ref 38–126)
BUN: 7 mg/dL (ref 6–20)
CHLORIDE: 101 mmol/L (ref 98–111)
CO2: 17 mmol/L — ABNORMAL LOW (ref 22–32)
Calcium: 9.1 mg/dL (ref 8.9–10.3)
Creatinine, Ser: 0.52 mg/dL (ref 0.44–1.00)
GFR calc Af Amer: >60 mL/min (ref >60)
Glucose, Bld: 78 mg/dL (ref 70–99)
Potassium: 3.8 mmol/L (ref 3.5–5.1)
Sodium: 131 mmol/L — ABNORMAL LOW (ref 135–145)
TOTAL PROTEIN: 7.9 g/dL (ref 6.5–8.1)
Total Bilirubin: 1.5 mg/dL — ABNORMAL HIGH (ref 0.3–1.2)

## 2018-07-04 LAB — CBC
HCT: 38.8 % (ref 36.0–46.0)
Hemoglobin: 13.5 g/dL (ref 12.0–15.0)
MCH: 29.7 pg (ref 26.0–34.0)
MCHC: 34.8 g/dL (ref 30.0–36.0)
MCV: 85.3 fL (ref 78.0–100.0)
Platelets: 206 10*3/uL (ref 150–400)
RBC: 4.55 MIL/uL (ref 3.87–5.11)
RDW: 11.7 % (ref 11.5–15.5)
WBC: 5.8 10*3/uL (ref 4.0–10.5)

## 2018-07-04 MED ORDER — DEXTROSE IN LACTATED RINGERS 5 % IV SOLN
Freq: Once | INTRAVENOUS | Status: DC
  Filled 2018-07-04: qty 1000

## 2018-07-04 MED ORDER — PROCHLORPERAZINE EDISYLATE 10 MG/2ML IJ SOLN
10.0000 mg | Freq: Once | INTRAMUSCULAR | Status: AC
  Administered 2018-07-04: 10 mg via INTRAVENOUS
  Filled 2018-07-04: qty 2

## 2018-07-04 MED ORDER — PROCHLORPERAZINE MALEATE 10 MG PO TABS: 10.0000 mg | ORAL_TABLET | Freq: Two times a day (BID) | ORAL | 1 refills | Status: DC | PRN

## 2018-07-04 MED ORDER — M.V.I. ADULT IV INJ
Freq: Once | INTRAVENOUS | Status: AC
  Administered 2018-07-04: 21:00:00 via INTRAVENOUS
  Filled 2018-07-04: qty 10

## 2018-07-04 MED ORDER — LACTATED RINGERS IV BOLUS: 1000.0000 mL | Freq: Once | INTRAVENOUS | Status: DC

## 2018-07-04 MED ORDER — DEXTROSE 5 % IN LACTATED RINGERS IV BOLUS
1000.0000 mL | Freq: Once | INTRAVENOUS | Status: DC
  Administered 2018-07-04: 1000 mL via INTRAVENOUS

## 2018-07-04 NOTE — MAU Provider Note (Addendum)
History     CSN: [id number]  Arrival date and time: 07/04/18 1851   None     Chief Complaint  Patient presents with  . Emesis   HPI This is a 27 year old G3 P2-0-0-2 at 10 weeks and 6 days presents with nausea and vomiting.  She states that she has not been able to keep anything down for many days.  Was last seen on 06/24/2018 and was prescribed Zofran and Phenergan and she states that she is tried to take these, but this is not helpful.  Vomits both liquid and food.  Does not tolerate sips of fluids.  Has not started prenatal care.  OB History    Gravida  3   Para  2   Term  2   Preterm      AB      Living  2     SAB      TAB      Ectopic      Multiple      Live Births  2           Past Medical History:  Diagnosis Date  . DVT (deep venous thrombosis) (HCC)     Past Surgical History:  Procedure Laterality Date  . NO PAST SURGERIES      Family History  Problem Relation Age of Onset  . Asthma Mother   . Diabetes Mother   . Hypertension Mother   . Cancer Father   . Hypertension Father   . Asthma Sister   . Hypertension Sister   . Asthma Brother   . Hypertension Brother     Social History   Tobacco Use  . Smoking status: Former Smoker    Packs/day: 0.00    Types: Cigarettes    Last attempt to quit: 05/31/2018    Years since quitting: 0.0  . Smokeless tobacco: Never Used  Substance Use Topics  . Alcohol use: No  . Drug use: No    Allergies:  Allergies  Allergen Reactions  . Fish Allergy Anaphylaxis    Medications Prior to Admission  Medication Sig Dispense Refill Last Dose  . ondansetron (ZOFRAN ODT) 8 MG disintegrating tablet Take 1 tablet (8 mg total) by mouth every 8 (eight) hours as needed for nausea or vomiting. 20 tablet 0 07/04/2018 at Unknown time  . promethazine (PHENERGAN) 12.5 MG tablet Take 2 tablets (25 mg total) by mouth every 6 (six) hours as needed for nausea or vomiting. 30 tablet 0 07/04/2018 at Unknown time  .  butalbital-acetaminophen-caffeine (FIORICET, ESGIC) 50-325-40 MG tablet Take 1 tablet by mouth every 6 (six) hours as needed for headache. 20 tablet 0 not taking  . metoCLOPramide (REGLAN) 10 MG tablet Take 1 tablet (10 mg total) by mouth every 6 (six) hours as needed for nausea or vomiting. Do not take within 6 hours of Phenergan 30 tablet 1 not taking    Review of Systems Physical Exam   Blood pressure 111/65, pulse 70, temperature 98.5 F (36.9 C), temperature source Oral, resp. rate 18, height 5\' 3"  (1.6 m), weight 167 lb 0.6 oz (75.8 kg), last menstrual period 04/04/2018, SpO2 98 %, unknown if currently breastfeeding.  Physical Exam  Constitutional: She is oriented to person, place, and time. She appears well-developed and well-nourished.  HENT:  Head: Normocephalic and atraumatic.  Right Ear: External ear normal.  Left Ear: External ear normal.  Eyes: Pupils are equal, round, and reactive to light.  Cardiovascular: Normal rate and regular rhythm.  Respiratory: Effort normal and breath sounds normal. No respiratory distress. She has no wheezes.  GI: Soft. She exhibits no distension. There is no tenderness. There is no rebound and no guarding.  Neurological: She is alert and oriented to person, place, and time.  Skin: Skin is warm and dry. No rash noted. No erythema. No pallor.  Psychiatric: She has a normal mood and affect. Her behavior is normal. Thought content normal.   Results for orders placed or performed during the hospital encounter of 07/04/18 (from the past 24 hour(s))  Urinalysis, Routine w reflex microscopic     Status: Abnormal   Collection Time: 07/04/18  7:33 PM  Result Value Ref Range   Color, Urine AMBER (A) YELLOW   APPearance CLOUDY (A) CLEAR   Specific Gravity, Urine 1.030 1.005 - 1.030   pH 5.0 5.0 - 8.0   Glucose, UA NEGATIVE NEGATIVE mg/dL   Hgb urine dipstick NEGATIVE NEGATIVE   Bilirubin Urine NEGATIVE NEGATIVE   Ketones, ur 80 (A) NEGATIVE mg/dL    Protein, ur 324 (A) NEGATIVE mg/dL   Nitrite NEGATIVE NEGATIVE   Leukocytes, UA MODERATE (A) NEGATIVE   RBC / HPF 11-20 0 - 5 RBC/hpf   WBC, UA >50 (H) 0 - 5 WBC/hpf   Bacteria, UA RARE (A) NONE SEEN   Squamous Epithelial / LPF >50 (H) 0 - 5   Mucus PRESENT    Non Squamous Epithelial 0-5 (A) NONE SEEN  Comprehensive metabolic panel     Status: Abnormal   Collection Time: 07/04/18  8:38 PM  Result Value Ref Range   Sodium 131 (L) 135 - 145 mmol/L   Potassium 3.8 3.5 - 5.1 mmol/L   Chloride 101 98 - 111 mmol/L   CO2 17 (L) 22 - 32 mmol/L   Glucose, Bld 78 70 - 99 mg/dL   BUN 7 6 - 20 mg/dL   Creatinine, Ser 4.01 0.44 - 1.00 mg/dL   Calcium 9.1 8.9 - 02.7 mg/dL   Total Protein 7.9 6.5 - 8.1 g/dL   Albumin 3.7 3.5 - 5.0 g/dL   AST 28 15 - 41 U/L   ALT 34 0 - 44 U/L   Alkaline Phosphatase 55 38 - 126 U/L   Total Bilirubin 1.5 (H) 0.3 - 1.2 mg/dL   GFR calc non Af Amer >60 >60 mL/min   GFR calc Af Amer >60 >60 mL/min   Anion gap 13 5 - 15  CBC     Status: None   Collection Time: 07/04/18  8:38 PM  Result Value Ref Range   WBC 5.8 4.0 - 10.5 K/uL   RBC 4.55 3.87 - 5.11 MIL/uL   Hemoglobin 13.5 12.0 - 15.0 g/dL   HCT 25.3 66.4 - 40.3 %   MCV 85.3 78.0 - 100.0 fL   MCH 29.7 26.0 - 34.0 pg   MCHC 34.8 30.0 - 36.0 g/dL   RDW 47.4 25.9 - 56.3 %   Platelets 206 150 - 400 K/uL     MAU Course  Procedures  MDM Orders Placed This Encounter  Procedures  . Culture, OB Urine  . Urinalysis, Routine w reflex microscopic  . Comprehensive metabolic panel  . CBC  . Discharge patient Discharge disposition: 01-Home or Self Care; Discharge patient date: 07/04/2018   Urine culture- pending  UA- showed dehydration, 80 ketones and 100 protein.  CBC and CMP- WNL   Meds ordered this encounter  Medications  . lactated ringers bolus 1,000 mL  . DISCONTD: dextrose  5% lactated ringers 1,000 mL IV fluid bolus  . prochlorperazine (COMPAZINE) injection 10 mg  . DISCONTD: dextrose 5% lactated  ringers bolus 1,000 mL  . multivitamins adult (MVI -12) 10 mL in dextrose 5% lactated ringers 1,000 mL infusion  . prochlorperazine (COMPAZINE) 10 MG tablet    Sig: Take 1 tablet (10 mg total) by mouth 2 (two) times daily as needed for nausea or vomiting.    Dispense:  30 tablet    Refill:  1    Order Specific Question:   Supervising Provider    Answer:   Levie HeritageSTINSON, JACOB J [[id number]]   Treatments in MAU included LR bolus, multivitamin bag IV and compazine 10mg  IV. Patient reports feeling better after treatment and able to keep down fluids and crackers prior to discharge home. Rx sent for compazine to pharmacy of choice. Pt discharged. Pt stable prior to discharge.   Assessment and Plan   1. Nausea and vomiting during pregnancy    Pt discharge.  Make appointment to be seen to initiate prenatal care.  Rx for compazine  Return to MAU as needed   Follow-up Information    [facility]   Why:  Make initial prenatal appointment to be seen  Contact information: [address] [place] [address] [phone]          Allergies as of 07/04/2018      Reactions   Fish Allergy Anaphylaxis      Medication List    STOP taking these medications   butalbital-acetaminophen-caffeine 50-325-40 MG tablet Commonly known as:  FIORICET, ESGIC   metoCLOPramide 10 MG tablet Commonly known as:  REGLAN   ondansetron 8 MG disintegrating tablet Commonly known as:  ZOFRAN ODT   promethazine 12.5 MG tablet Commonly known as:  PHENERGAN     TAKE these medications   prochlorperazine 10 MG tablet Commonly known as:  COMPAZINE Take 1 tablet (10 mg total) by mouth 2 (two) times daily as needed for nausea or vomiting.      Sharyon CableVeronica C Derreon Consalvo, CNM 07/05/18, 12:51 AM

## 2018-07-04 NOTE — MAU Note (Signed)
Pt was seen 6/29 for N/V. Given PO Phenergan and was to continue PO Zofran. Patient has had no relief from anti-emetics. 10 episodes of emesis in 24 hrs.

## 2018-07-04 NOTE — Discharge Instructions (Signed)

## 2018-07-06 LAB — CULTURE, OB URINE

## 2018-07-12 ENCOUNTER — Ambulatory Visit (INDEPENDENT_AMBULATORY_CARE_PROVIDER_SITE_OTHER): Payer: Medicaid Other | Admitting: Obstetrics

## 2018-07-12 ENCOUNTER — Other Ambulatory Visit (HOSPITAL_COMMUNITY)
Admission: RE | Admit: 2018-07-12 | Discharge: 2018-07-12 | Disposition: A | Discharge status: Home / Self Care | Payer: Medicaid Other | Source: Ambulatory Visit | Attending: Obstetrics | Admitting: Obstetrics

## 2018-07-12 ENCOUNTER — Encounter: Payer: Self-pay | Admitting: Obstetrics

## 2018-07-12 VITALS — BP 114/67 | HR 79 | Wt 174.3 lb

## 2018-07-12 DIAGNOSIS — Z3481 Encounter for supervision of other normal pregnancy, first trimester: Secondary | ICD-10-CM | POA: Diagnosis not present

## 2018-07-12 DIAGNOSIS — Z3491 Encounter for supervision of normal pregnancy, unspecified, first trimester: Secondary | ICD-10-CM | POA: Diagnosis not present

## 2018-07-12 DIAGNOSIS — N898 Other specified noninflammatory disorders of vagina: Secondary | ICD-10-CM

## 2018-07-12 DIAGNOSIS — Z3482 Encounter for supervision of other normal pregnancy, second trimester: Secondary | ICD-10-CM

## 2018-07-12 DIAGNOSIS — Z01419 Encounter for gynecological examination (general) (routine) without abnormal findings: Secondary | ICD-10-CM | POA: Diagnosis not present

## 2018-07-12 DIAGNOSIS — Z3490 Encounter for supervision of normal pregnancy, unspecified, unspecified trimester: Secondary | ICD-10-CM | POA: Insufficient documentation

## 2018-07-12 HISTORY — DX: Encounter for supervision of normal pregnancy, unspecified, unspecified trimester: Z34.90

## 2018-07-12 MED ORDER — PROVIDA OB 20-20-1.25 MG PO CAPS: 1.0000 | ORAL_CAPSULE | Freq: Every day | ORAL | 4 refills | Status: DC

## 2018-07-12 NOTE — Progress Notes (Signed)
Genetic Screening: Desired.

## 2018-07-12 NOTE — Progress Notes (Signed)
Subjective:  Claudia SnufferJessica Miller is a 27 y.o. G3P2002 at 4337w0d being seen today for ongoing prenatal care.  She is currently monitored for the following issues for this low-risk pregnancy and has Insufficient prenatal care in third trimester; Trichomonal vaginitis during pregnancy; Normal labor; Active labor; and Supervision of normal pregnancy, antepartum on their problem list.  Patient reports no complaints.  Contractions: Regular. Vag. Bleeding: None.  Movement: Absent. Denies leaking of fluid.   The following portions of the patient's history were reviewed and updated as appropriate: allergies, current medications, past family history, past medical history, past social history, past surgical history and problem list. Problem list updated.  Objective:   Vitals:   07/12/18 1117  BP: 114/67  Pulse: 79  Weight: 174 lb 4.8 oz (79.1 kg)    Fetal Status: Fetal Heart Rate (bpm): 156   Movement: Absent     General:  Alert, oriented and cooperative. Patient is in no acute distress.  Skin: Skin is warm and dry. No rash noted.   Cardiovascular: Normal heart rate noted  Respiratory: Normal respiratory effort, no problems with respiration noted  Abdomen: Soft, gravid, appropriate for gestational age. Pain/Pressure: Present     Pelvic:  Cervical exam deferred        Extremities: Normal range of motion.  Edema: Trace  Mental Status: Normal mood and affect. Normal behavior. Normal judgment and thought content.   Urinalysis:      Assessment and Plan:  Pregnancy: G3P2002 at 4237w0d  1. Encounter for supervision of normal pregnancy, antepartum, unspecified gravidity Rx: - Obstetric Panel, Including HIV - Culture, OB Urine - Cytology - PAP - Genetic Screening - Hemoglobinopathy evaluation - Cystic Fibrosis Mutation 97 - SMN1 COPY NUMBER ANALYSIS (SMA Carrier Screen) - Prenat w/o A Vit-FeFum-FePo-FA (PROVIDA OB) 20-20-1.25 MG CAPS; Take 1 capsule by mouth daily before breakfast.  Dispense: 90  capsule; Refill: 4  2. Vaginal discharge Rx: - Cervicovaginal ancillary only  Preterm labor symptoms and general obstetric precautions including but not limited to vaginal bleeding, contractions, leaking of fluid and fetal movement were reviewed in detail with the patient. Please refer to After Visit Summary for other counseling recommendations.  Return in about 1 month (around 08/09/2018) for ROB.   Brock BadHarper, Yarden Hillis A, MD

## 2018-07-13 ENCOUNTER — Other Ambulatory Visit: Payer: Self-pay | Admitting: Obstetrics

## 2018-07-13 LAB — CERVICOVAGINAL ANCILLARY ONLY
Bacterial vaginitis: POSITIVE — AB
CANDIDA VAGINITIS: NEGATIVE
CHLAMYDIA, DNA PROBE: NEGATIVE
Neisseria Gonorrhea: NEGATIVE
TRICH (WINDOWPATH): POSITIVE — AB

## 2018-07-13 MED ORDER — TINIDAZOLE 500 MG PO TABS
2.0000 g | ORAL_TABLET | Freq: Every day | ORAL | 2 refills | Status: DC
Start: 2018-07-13 — End: 2018-08-15

## 2018-07-14 LAB — CULTURE, OB URINE

## 2018-07-14 LAB — URINE CULTURE, OB REFLEX

## 2018-07-14 NOTE — Progress Notes (Signed)
Left voicemail for pt to call back.

## 2018-07-16 LAB — CYTOLOGY - PAP
DIAGNOSIS: NEGATIVE
Diagnosis: REACTIVE

## 2018-07-18 LAB — CYSTIC FIBROSIS MUTATION 97: GENE DIS ANAL CARRIER INTERP BLD/T-IMP: NOT DETECTED

## 2018-07-20 LAB — OBSTETRIC PANEL, INCLUDING HIV
ANTIBODY SCREEN: NEGATIVE
BASOS ABS: 0 10*3/uL (ref 0.0–0.2)
BASOS: 0 %
EOS (ABSOLUTE): 0 10*3/uL (ref 0.0–0.4)
EOS: 1 %
HEMATOCRIT: 37.6 % (ref 34.0–46.6)
HEMOGLOBIN: 12.3 g/dL (ref 11.1–15.9)
HIV Screen 4th Generation wRfx: NONREACTIVE
Hepatitis B Surface Ag: NEGATIVE
Immature Grans (Abs): 0 10*3/uL (ref 0.0–0.1)
Immature Granulocytes: 0 %
LYMPHS: 21 %
Lymphocytes Absolute: 1 10*3/uL (ref 0.7–3.1)
MCH: 29.1 pg (ref 26.6–33.0)
MCHC: 32.7 g/dL (ref 31.5–35.7)
MCV: 89 fL (ref 79–97)
MONOCYTES: 8 %
Monocytes Absolute: 0.4 10*3/uL (ref 0.1–0.9)
Neutrophils Absolute: 3.4 10*3/uL (ref 1.4–7.0)
Neutrophils: 70 %
Platelets: 193 10*3/uL (ref 150–450)
RBC: 4.23 x10E6/uL (ref 3.77–5.28)
RDW: 11.8 % — ABNORMAL LOW (ref 12.3–15.4)
RPR Ser Ql: NONREACTIVE
Rh Factor: POSITIVE
Rubella Antibodies, IGG: 5.96 index (ref >0.99)
WBC: 4.7 10*3/uL (ref 3.4–10.8)

## 2018-07-20 LAB — SMN1 COPY NUMBER ANALYSIS (SMA CARRIER SCREENING)

## 2018-07-20 LAB — HEMOGLOBINOPATHY EVALUATION
HEMOGLOBIN F QUANTITATION: 0 % (ref 0.0–2.0)
HGB A: 97.6 % (ref 96.4–98.8)
HGB C: 0 %
HGB S: 0 %
HGB VARIANT: 0 %
Hemoglobin A2 Quantitation: 2.4 % (ref 1.8–3.2)

## 2018-08-09 ENCOUNTER — Encounter: Payer: Self-pay | Admitting: Obstetrics and Gynecology

## 2018-08-09 ENCOUNTER — Encounter: Payer: Medicaid Other | Admitting: Obstetrics

## 2018-08-09 DIAGNOSIS — Z86718 Personal history of other venous thrombosis and embolism: Secondary | ICD-10-CM | POA: Insufficient documentation

## 2018-08-09 DIAGNOSIS — A599 Trichomoniasis, unspecified: Secondary | ICD-10-CM | POA: Insufficient documentation

## 2018-08-09 HISTORY — DX: Trichomoniasis, unspecified: A59.9

## 2018-08-15 ENCOUNTER — Encounter: Payer: Self-pay | Admitting: Obstetrics and Gynecology

## 2018-08-15 ENCOUNTER — Ambulatory Visit (INDEPENDENT_AMBULATORY_CARE_PROVIDER_SITE_OTHER): Payer: Medicaid Other | Admitting: Obstetrics and Gynecology

## 2018-08-15 ENCOUNTER — Other Ambulatory Visit (HOSPITAL_COMMUNITY)
Admission: RE | Admit: 2018-08-15 | Discharge: 2018-08-15 | Disposition: A | Discharge status: Home / Self Care | Payer: Medicaid Other | Source: Ambulatory Visit | Attending: Obstetrics and Gynecology | Admitting: Obstetrics and Gynecology

## 2018-08-15 VITALS — BP 112/75 | HR 79 | Wt 178.0 lb

## 2018-08-15 DIAGNOSIS — Z3492 Encounter for supervision of normal pregnancy, unspecified, second trimester: Secondary | ICD-10-CM | POA: Diagnosis not present

## 2018-08-15 DIAGNOSIS — A599 Trichomoniasis, unspecified: Secondary | ICD-10-CM

## 2018-08-15 DIAGNOSIS — R319 Hematuria, unspecified: Secondary | ICD-10-CM

## 2018-08-15 DIAGNOSIS — Z3490 Encounter for supervision of normal pregnancy, unspecified, unspecified trimester: Secondary | ICD-10-CM | POA: Diagnosis not present

## 2018-08-15 DIAGNOSIS — O23592 Infection of other part of genital tract in pregnancy, second trimester: Secondary | ICD-10-CM | POA: Diagnosis not present

## 2018-08-15 DIAGNOSIS — Z23 Encounter for immunization: Secondary | ICD-10-CM | POA: Diagnosis not present

## 2018-08-15 LAB — POCT URINALYSIS DIPSTICK
BILIRUBIN UA: NEGATIVE
Glucose, UA: NEGATIVE
Nitrite, UA: NEGATIVE
Protein, UA: POSITIVE — AB
SPEC GRAV UA: 1.015 (ref 1.010–1.025)
UROBILINOGEN UA: 0.2 U/dL
pH, UA: 6 (ref 5.0–8.0)

## 2018-08-15 MED ORDER — CEPHALEXIN 500 MG PO CAPS: 500.0000 mg | ORAL_CAPSULE | Freq: Three times a day (TID) | ORAL | 0 refills | Status: DC

## 2018-08-15 MED ORDER — PHENAZOPYRIDINE HCL 200 MG PO TABS: 200.0000 mg | ORAL_TABLET | Freq: Three times a day (TID) | ORAL | 1 refills | Status: DC | PRN

## 2018-08-15 MED ORDER — CEFTRIAXONE SODIUM 250 MG IJ SOLR
250.0000 mg | Freq: Once | INTRAMUSCULAR | Status: AC
  Administered 2018-08-15: 250 mg via INTRAMUSCULAR

## 2018-08-15 NOTE — Progress Notes (Signed)
Subjective:  Claudia Miller is a 27 y.o. G3P2002 at 8658w6d being seen today for ongoing prenatal care.  She is currently monitored for the following issues for this low-risk pregnancy and has Supervision of normal pregnancy, antepartum; Trichomoniasis; and Hematuria on their problem list.  Patient reports lower abd cramps since last night. denies vag bleeding, vaginal discharge or LOF. denies recent IC as well. reports completeing medication for trich.  Contractions: Irritability. Vag. Bleeding: None.  Movement: Present. Denies leaking of fluid.   The following portions of the patient's history were reviewed and updated as appropriate: allergies, current medications, past family history, past medical history, past social history, past surgical history and problem list. Problem list updated.  Objective:   Vitals:   08/15/18 0937  BP: 112/75  Pulse: 79  Weight: 178 lb (80.7 kg)    Fetal Status:     Movement: Present     General:  Alert, oriented and cooperative. Patient is in no acute distress.  Skin: Skin is warm and dry. No rash noted.   Cardiovascular: Normal heart rate noted  Respiratory: Normal respiratory effort, no problems with respiration noted  Abdomen: Soft, gravid, appropriate for gestational age. Pain/Pressure: Present     Pelvic:  Cervical exam performed        Extremities: Normal range of motion.     Mental Status: Normal mood and affect. Normal behavior. Normal judgment and thought content.   Urinalysis:      Assessment and Plan:  Pregnancy: G3P2002 at 5158w6d  1. Encounter for supervision of normal pregnancy, antepartum, unspecified gravidity Stable - Cervicovaginal ancillary only - POCT urinalysis dipstick - Culture, OB Urine  2. Trichomoniasis TOC today  3. Hematuria, unspecified type S/Sx of UTI. Will check UC. Rocephin today. Start medications Pt instructed to present to MAU if Sx worsen, no improvement, or any concerns. Cervix LTC, do not suspect PTL F/U  next week  Preterm labor symptoms and general obstetric precautions including but not limited to vaginal bleeding, contractions, leaking of fluid and fetal movement were reviewed in detail with the patient. Please refer to After Visit Summary for other counseling recommendations.  Return in about 4 weeks (around 09/12/2018) for OB visit.   Hermina StaggersErvin, Ifeanyi Mickelson L, MD

## 2018-08-15 NOTE — Addendum Note (Signed)
Addended by: Marya LandryFOSTER, Tyesha Joffe D on: 08/15/2018 01:56 PM   Modules accepted: Orders

## 2018-08-15 NOTE — Patient Instructions (Signed)

## 2018-08-17 LAB — CERVICOVAGINAL ANCILLARY ONLY
BACTERIAL VAGINITIS: POSITIVE — AB
CANDIDA VAGINITIS: NEGATIVE
Chlamydia: NEGATIVE
Neisseria Gonorrhea: NEGATIVE
Trichomonas: POSITIVE — AB

## 2018-08-18 ENCOUNTER — Other Ambulatory Visit: Payer: Self-pay

## 2018-08-18 DIAGNOSIS — A599 Trichomoniasis, unspecified: Secondary | ICD-10-CM

## 2018-08-18 LAB — URINE CULTURE, OB REFLEX

## 2018-08-18 LAB — CULTURE, OB URINE

## 2018-08-18 MED ORDER — METRONIDAZOLE 500 MG PO TABS: 500.0000 mg | ORAL_TABLET | Freq: Two times a day (BID) | ORAL | 0 refills | Status: DC

## 2018-08-23 ENCOUNTER — Encounter: Payer: Medicaid Other | Admitting: Obstetrics and Gynecology

## 2018-09-19 ENCOUNTER — Encounter: Payer: Medicaid Other | Admitting: Obstetrics & Gynecology

## 2018-10-17 ENCOUNTER — Encounter: Payer: Medicaid Other | Admitting: Obstetrics and Gynecology

## 2018-11-02 ENCOUNTER — Encounter: Payer: Self-pay | Admitting: Obstetrics and Gynecology

## 2018-11-02 ENCOUNTER — Other Ambulatory Visit: Payer: Self-pay

## 2018-11-02 ENCOUNTER — Other Ambulatory Visit: Payer: Medicaid Other

## 2018-11-02 ENCOUNTER — Ambulatory Visit (INDEPENDENT_AMBULATORY_CARE_PROVIDER_SITE_OTHER): Payer: Medicaid Other | Admitting: Obstetrics and Gynecology

## 2018-11-02 VITALS — BP 118/72 | HR 87 | Wt 188.0 lb

## 2018-11-02 DIAGNOSIS — Z3490 Encounter for supervision of normal pregnancy, unspecified, unspecified trimester: Secondary | ICD-10-CM | POA: Diagnosis not present

## 2018-11-02 NOTE — Progress Notes (Signed)
   PRENATAL VISIT NOTE  Subjective:  Claudia Miller is a 27 y.o. G3P2002 at [redacted]w[redacted]d being seen today for ongoing prenatal care.  She is currently monitored for the following issues for this low-risk pregnancy and has Supervision of normal pregnancy, antepartum; Trichomoniasis; and Hematuria on their problem list.  Patient reports no complaints.  Contractions: Irritability. Vag. Bleeding: None.  Movement: Present. Denies leaking of fluid.   The following portions of the patient's history were reviewed and updated as appropriate: allergies, current medications, past family history, past medical history, past social history, past surgical history and problem list. Problem list updated.  Objective:   Vitals:   11/02/18 0853  BP: 118/72  Pulse: 87  Weight: 188 lb (85.3 kg)    Fetal Status: Fetal Heart Rate (bpm): 142   Movement: Present     General:  Alert, oriented and cooperative. Patient is in no acute distress.  Skin: Skin is warm and dry. No rash noted.   Cardiovascular: Normal heart rate noted  Respiratory: Normal respiratory effort, no problems with respiration noted  Abdomen: Soft, gravid, appropriate for gestational age.  Pain/Pressure: Present     Pelvic: Cervical exam deferred        Extremities: Normal range of motion.  Edema: None  Mental Status: Normal mood and affect. Normal behavior. Normal judgment and thought content.   Assessment and Plan:  Pregnancy: G3P2002 at [redacted]w[redacted]d  1. Encounter for supervision of normal pregnancy, antepartum, unspecified gravidity Patient is doing well without complaints Patient missed several appointments due to lack of transportation. Discussed BRX optimized schedule for assistance. Patient is interested Third trimester labs today Anatomy ultrasound ordered  Preterm labor symptoms and general obstetric precautions including but not limited to vaginal bleeding, contractions, leaking of fluid and fetal movement were reviewed in detail with the  patient. Please refer to After Visit Summary for other counseling recommendations.  No follow-ups on file.  Future Appointments  Date Time Provider Department Center  11/02/2018  9:30 AM Flynt Breeze, Gigi Gin, MD [facility] None    Catalina Antigua, MD

## 2018-11-02 NOTE — Addendum Note (Signed)
Addended by: Judd Gaudier on: 11/02/2018 09:35 AM   Modules accepted: Orders

## 2018-11-03 LAB — CBC
Hematocrit: 35.2 % (ref 34.0–46.6)
Hemoglobin: 11.6 g/dL (ref 11.1–15.9)
MCH: 29.2 pg (ref 26.6–33.0)
MCHC: 33 g/dL (ref 31.5–35.7)
MCV: 89 fL (ref 79–97)
Platelets: 186 x10E3/uL (ref 150–450)
RBC: 3.97 x10E6/uL (ref 3.77–5.28)
RDW: 12.2 % — ABNORMAL LOW (ref 12.3–15.4)
WBC: 5.2 x10E3/uL (ref 3.4–10.8)

## 2018-11-03 LAB — RPR: RPR Ser Ql: NONREACTIVE

## 2018-11-03 LAB — GLUCOSE TOLERANCE, 2 HOURS W/ 1HR
Glucose, 1 hour: 159 mg/dL (ref 65–179)
Glucose, 2 hour: 119 mg/dL (ref 65–152)
Glucose, Fasting: 80 mg/dL (ref 65–91)

## 2018-11-03 LAB — HIV ANTIBODY (ROUTINE TESTING W REFLEX): HIV Screen 4th Generation wRfx: NONREACTIVE

## 2018-11-09 ENCOUNTER — Ambulatory Visit (HOSPITAL_COMMUNITY)
Admission: RE | Admit: 2018-11-09 | Discharge: 2018-11-09 | Disposition: A | Discharge status: Home / Self Care | Payer: Medicaid Other | Source: Ambulatory Visit | Attending: Obstetrics and Gynecology | Admitting: Obstetrics and Gynecology

## 2018-11-09 DIAGNOSIS — Z363 Encounter for antenatal screening for malformations: Secondary | ICD-10-CM | POA: Diagnosis not present

## 2018-11-09 DIAGNOSIS — Z3A31 31 weeks gestation of pregnancy: Secondary | ICD-10-CM | POA: Insufficient documentation

## 2018-11-09 DIAGNOSIS — Z3490 Encounter for supervision of normal pregnancy, unspecified, unspecified trimester: Secondary | ICD-10-CM

## 2018-11-20 ENCOUNTER — Telehealth: Payer: Self-pay

## 2018-11-20 NOTE — Telephone Encounter (Signed)
Pt called and would like to know if her due date has changed based off last US on 11/14. Measurements from that US and her current due date are slightly off. I advised pt that the provider will look at the US results and discuss this with her at her next visit on 12/5. Pt verbalized understanding.

## 2018-11-21 ENCOUNTER — Telehealth: Payer: Self-pay

## 2018-11-21 NOTE — Telephone Encounter (Signed)
S/w pt about BP readings in babyscripts, pt stated that she is having technical issues, provided tech support number.

## 2018-11-30 ENCOUNTER — Encounter: Payer: Self-pay | Admitting: Obstetrics and Gynecology

## 2018-11-30 ENCOUNTER — Ambulatory Visit (INDEPENDENT_AMBULATORY_CARE_PROVIDER_SITE_OTHER): Payer: Medicaid Other | Admitting: Obstetrics and Gynecology

## 2018-11-30 DIAGNOSIS — Z3490 Encounter for supervision of normal pregnancy, unspecified, unspecified trimester: Secondary | ICD-10-CM

## 2018-11-30 DIAGNOSIS — Z23 Encounter for immunization: Secondary | ICD-10-CM | POA: Diagnosis not present

## 2018-11-30 DIAGNOSIS — Z3493 Encounter for supervision of normal pregnancy, unspecified, third trimester: Secondary | ICD-10-CM

## 2018-11-30 NOTE — Progress Notes (Signed)
   PRENATAL VISIT NOTE  Subjective:  Link Claudia Miller is a 27 y.o. G3P2002 at 6347w1d being seen today for ongoing prenatal care.  She is currently monitored for the following issues for this low-risk pregnancy and has Supervision of normal pregnancy, antepartum; Trichomoniasis; and Hematuria on their problem list.  Patient reports no complaints.  Contractions: Irritability. Vag. Bleeding: None.  Movement: Present. Denies leaking of fluid.   The following portions of the patient's history were reviewed and updated as appropriate: allergies, current medications, past family history, past medical history, past social history, past surgical history and problem list. Problem list updated.  Objective:   Vitals:   11/30/18 1536  BP: 119/68  Pulse: (!) 105  Weight: 189 lb (85.7 kg)    Fetal Status: Fetal Heart Rate (bpm): 130 Fundal Height: 32 cm Movement: Present     General:  Alert, oriented and cooperative. Patient is in no acute distress.  Skin: Skin is warm and dry. No rash noted.   Cardiovascular: Normal heart rate noted  Respiratory: Normal respiratory effort, no problems with respiration noted  Abdomen: Soft, gravid, appropriate for gestational age.  Pain/Pressure: Present     Pelvic: Cervical exam deferred        Extremities: Normal range of motion.  Edema: None  Mental Status: Normal mood and affect. Normal behavior. Normal judgment and thought content.   Assessment and Plan:  Pregnancy: G3P2002 at 2247w1d  1. Encounter for supervision of normal pregnancy, antepartum, unspecified gravidity Patient is doing well without complaint Glucola results reviewed with the patient Cultures next visit Patient is on BRx optimized schedule but has only checked BP once. Education provided  Preterm labor symptoms and general obstetric precautions including but not limited to vaginal bleeding, contractions, leaking of fluid and fetal movement were reviewed in detail with the patient. Please refer  to After Visit Summary for other counseling recommendations.  Return in about 4 weeks (around 12/28/2018) for ROB.  No future appointments.  Claudia AntiguaPeggy Edlin Ford, MD

## 2018-11-30 NOTE — Progress Notes (Signed)
Ob.  TDAP given in RD, tolerated well.  Patient wants to know what is her correct due date and measurements.

## 2018-12-10 ENCOUNTER — Encounter (HOSPITAL_COMMUNITY): Payer: Self-pay

## 2018-12-10 ENCOUNTER — Other Ambulatory Visit: Payer: Self-pay

## 2018-12-10 ENCOUNTER — Inpatient Hospital Stay (HOSPITAL_COMMUNITY)
Admission: AD | Admit: 2018-12-10 | Discharge: 2018-12-10 | Disposition: A | Discharge status: Home / Self Care | Payer: Medicaid Other | Source: Ambulatory Visit | Attending: Obstetrics & Gynecology | Admitting: Obstetrics & Gynecology

## 2018-12-10 DIAGNOSIS — O4703 False labor before 37 completed weeks of gestation, third trimester: Secondary | ICD-10-CM | POA: Insufficient documentation

## 2018-12-10 DIAGNOSIS — O479 False labor, unspecified: Secondary | ICD-10-CM

## 2018-12-10 DIAGNOSIS — Z0371 Encounter for suspected problem with amniotic cavity and membrane ruled out: Secondary | ICD-10-CM | POA: Diagnosis not present

## 2018-12-10 DIAGNOSIS — Z3A33 33 weeks gestation of pregnancy: Secondary | ICD-10-CM | POA: Diagnosis not present

## 2018-12-10 DIAGNOSIS — Z87891 Personal history of nicotine dependence: Secondary | ICD-10-CM | POA: Diagnosis not present

## 2018-12-10 DIAGNOSIS — O4700 False labor before 37 completed weeks of gestation, unspecified trimester: Secondary | ICD-10-CM

## 2018-12-10 LAB — WET PREP, GENITAL
Clue Cells Wet Prep HPF POC: NONE SEEN
Sperm: NONE SEEN
Trich, Wet Prep: NONE SEEN
Yeast Wet Prep HPF POC: NONE SEEN

## 2018-12-10 LAB — URINALYSIS, ROUTINE W REFLEX MICROSCOPIC
Bilirubin Urine: NEGATIVE
GLUCOSE, UA: NEGATIVE mg/dL
Hgb urine dipstick: NEGATIVE
Ketones, ur: NEGATIVE mg/dL
Nitrite: NEGATIVE
Protein, ur: NEGATIVE mg/dL
SPECIFIC GRAVITY, URINE: 1.025 (ref 1.005–1.030)
pH: 7 (ref 5.0–8.0)

## 2018-12-10 LAB — URINALYSIS, MICROSCOPIC (REFLEX)

## 2018-12-10 NOTE — MAU Provider Note (Signed)
History     CSN: [id number]  Arrival date and time: 12/10/18 1252   First Provider Initiated Contact with Patient 12/10/18 1326      Chief Complaint  Patient presents with  . Contractions  . Rupture of Membranes   Mrs. Yochim is a 27 y/o previously healthy G3P2002 @ [redacted]w[redacted]d who presents to the MAU with "contractions" that are occurring every 15-30 mins since last night. She describes The feelings to be intermittent pain in her lower abdomen and back at a 7/10 rating. The pain largely subsides between events but nothing improves the pain during an event. Additionally, she notes a new discharge that started this morning. She describes the discharge as clear fluid without blood. There is not enough discharge to justify use of a pad. She has never experienced discharge or pains like this before. When asked directly if this feels like her other labors, she answers that it feels different.  Mrs Berthold denies fever, vomiting or lightheadednes. She endorses nausea and two episodes of diarrhea that are new since this pain started. She has no medical conditions, takes no medicines, and has no allergies. She has not consumed any water today and feels she might be a bit dehydrated. Her last coitus was yesterday. Mrs. Toelle's primary concern is with the wellness of the baby and is hopeful that she may soon be delivering.     OB History    Gravida  3   Para  2   Term  2   Preterm      AB      Living  2     SAB      TAB      Ectopic      Multiple      Live Births  2           Past Medical History:  Diagnosis Date  . DVT (deep venous thrombosis) (HCC)     Past Surgical History:  Procedure Laterality Date  . NO PAST SURGERIES      Family History  Problem Relation Age of Onset  . Asthma Mother   . Diabetes Mother   . Hypertension Mother   . Cancer Father   . Hypertension Father   . Asthma Sister   . Hypertension Sister   . Asthma Brother   . Hypertension Brother      Social History   Tobacco Use  . Smoking status: Former Smoker    Packs/day: 0.00    Types: Cigarettes    Last attempt to quit: 05/31/2018    Years since quitting: 0.5  . Smokeless tobacco: Never Used  Substance Use Topics  . Alcohol use: No  . Drug use: No    Allergies:  Allergies  Allergen Reactions  . Fish Allergy Anaphylaxis    Medications Prior to Admission  Medication Sig Dispense Refill Last Dose  . cephALEXin (KEFLEX) 500 MG capsule Take 1 capsule (500 mg total) by mouth 3 (three) times daily. 21 capsule 0 Unknown at Unknown time  . metroNIDAZOLE (FLAGYL) 500 MG tablet Take 1 tablet (500 mg total) by mouth 2 (two) times daily. 14 tablet 0 Unknown at Unknown time  . phenazopyridine (PYRIDIUM) 200 MG tablet Take 1 tablet (200 mg total) by mouth 3 (three) times daily as needed for pain (urethral spasm). 10 tablet 1 Unknown at Unknown time  . Prenat w/o A Vit-FeFum-FePo-FA (PROVIDA OB) 20-20-1.25 MG CAPS Take 1 capsule by mouth daily before breakfast. 90 capsule 4   .  prochlorperazine (COMPAZINE) 10 MG tablet Take 1 tablet (10 mg total) by mouth 2 (two) times daily as needed for nausea or vomiting. 30 tablet 1 Unknown at Unknown time    Review of Systems  Constitutional: Negative for activity change and fever.  HENT: Negative.   Respiratory: Negative for shortness of breath.   Cardiovascular: Negative for chest pain.  Gastrointestinal: Positive for abdominal pain, diarrhea and nausea. Negative for vomiting.  Genitourinary: Positive for vaginal discharge. Negative for vaginal bleeding.  Skin: Negative.   Neurological: Negative for dizziness and light-headedness.  Psychiatric/Behavioral: Negative for agitation and confusion.   Physical Exam   Blood pressure 117/60, pulse 98, temperature 98.1 F (36.7 C), temperature source Oral, resp. rate 20, weight 86.9 kg, last menstrual period 04/04/2018, unknown if currently breastfeeding.  Physical Exam  MAU Course   Procedures  MDM Mrs. Bink's H&P was initially concerning for pre-term labor. Her fetal monitor was reassuring and did not demonstrate organized contractions. She received a pelvic exam which showed minimal fluid loss, frothy discharge, a closed cervix and significant cervical tenderness.Swabs were collected for microbiology. Given her poor hydration status and reassuring labs, she will be discharged with instructions to follow up with her OB for follow on care.   Assessment and Plan   1. Preterm contractions   2. Encounter for suspected premature rupture of amniotic membranes, with rupture of membranes not found   3. [redacted] weeks gestation of pregnancy    Patient educated about the importance of hydration and its relationship with contractions Patient advised to return if pains worsen, or if she feels contractions that do no improve with rest and hydration q3-5 minutes.  Follow up with OB for continuing pre-natal care   Salomon FickJohn A Degan Hanser MS3  12/10/2018, 1:52 PM

## 2018-12-10 NOTE — Discharge Instructions (Signed)

## 2018-12-10 NOTE — MAU Provider Note (Signed)
History     CSN: [id number]  Arrival date and time: 12/10/18 1252   First Provider Initiated Contact with Patient 12/10/18 1326      Chief Complaint  Patient presents with  . Contractions  . Rupture of Membranes   HPI [name] is a 27 y/o previously healthy G3P2002 @ 4351w4d who presents to the MAU with "contractions" that are occurring every 15-30 mins since last night. She describes The feelings to be intermittent pain in her lower abdomen and back at a 7/10 rating. The pain largely subsides between events but nothing improves the pain during an event. Additionally, she notes a new discharge that started this morning. She describes the discharge as clear fluid without blood. There is not enough discharge to justify use of a pad. She has never experienced discharge or pains like this before. When asked directly if this feels like her other labors, she answers that it feels different.  [name] denies fever, vomiting or lightheadednes. She endorses nausea and two episodes of diarrhea that are new since this pain started. She has no medical conditions, takes no medicines, and has no allergies. She has not consumed any water today and feels she might be a bit dehydrated. Her last coitus was yesterday. [name]'s primary concern is with the wellness of the baby and is hopeful that she may soon be delivering.    OB History    Gravida  3   Para  2   Term  2   Preterm      AB      Living  2     SAB      TAB      Ectopic      Multiple      Live Births  2           Past Medical History:  Diagnosis Date  . DVT (deep venous thrombosis) (HCC)     Past Surgical History:  Procedure Laterality Date  . NO PAST SURGERIES      Family History  Problem Relation Age of Onset  . Asthma Mother   . Diabetes Mother   . Hypertension Mother   . Cancer Father   . Hypertension Father   . Asthma Sister   . Hypertension Sister   . Asthma Brother   . Hypertension Brother      Social History   Tobacco Use  . Smoking status: Former Smoker    Packs/day: 0.00    Types: Cigarettes    Last attempt to quit: 05/31/2018    Years since quitting: 0.5  . Smokeless tobacco: Never Used  Substance Use Topics  . Alcohol use: No  . Drug use: No    Allergies:  Allergies  Allergen Reactions  . Fish Allergy Anaphylaxis    Medications Prior to Admission  Medication Sig Dispense Refill Last Dose  . cephALEXin (KEFLEX) 500 MG capsule Take 1 capsule (500 mg total) by mouth 3 (three) times daily. 21 capsule 0 Unknown at Unknown time  . metroNIDAZOLE (FLAGYL) 500 MG tablet Take 1 tablet (500 mg total) by mouth 2 (two) times daily. 14 tablet 0 Unknown at Unknown time  . phenazopyridine (PYRIDIUM) 200 MG tablet Take 1 tablet (200 mg total) by mouth 3 (three) times daily as needed for pain (urethral spasm). 10 tablet 1 Unknown at Unknown time  . Prenat w/o A Vit-FeFum-FePo-FA (PROVIDA OB) 20-20-1.25 MG CAPS Take 1 capsule by mouth daily before breakfast. 90 capsule 4   .  prochlorperazine (COMPAZINE) 10 MG tablet Take 1 tablet (10 mg total) by mouth 2 (two) times daily as needed for nausea or vomiting. 30 tablet 1 Unknown at Unknown time    Review of Systems  Constitutional: Negative.  Negative for fatigue and fever.  HENT: Negative.   Respiratory: Negative.  Negative for shortness of breath.   Cardiovascular: Negative.  Negative for chest pain.  Gastrointestinal: Positive for abdominal pain. Negative for constipation, diarrhea, nausea and vomiting.  Genitourinary: Positive for vaginal discharge. Negative for dysuria and vaginal bleeding.  Neurological: Negative.  Negative for dizziness and headaches.   Physical Exam   Blood pressure 117/60, pulse 98, temperature 98.1 F (36.7 C), temperature source Oral, resp. rate 20, weight 86.9 kg, last menstrual period 04/04/2018, unknown if currently breastfeeding.  Physical Exam  Nursing note and vitals reviewed. Constitutional:  She is oriented to person, place, and time. She appears well-developed and well-nourished. No distress.  HENT:  Head: Normocephalic.  Eyes: Pupils are equal, round, and reactive to light.  Cardiovascular: Normal rate, regular rhythm and normal heart sounds.  Respiratory: Effort normal and breath sounds normal. No respiratory distress.  GI: Soft. Bowel sounds are normal. She exhibits no distension. There is no abdominal tenderness.  Genitourinary:    Genitourinary Comments: SSE: small amount of frothy white discharge, No pooling   Neurological: She is alert and oriented to person, place, and time.  Skin: Skin is warm and dry.  Psychiatric: She has a normal mood and affect. Her behavior is normal. Judgment and thought content normal.   Fetal Tracing:  Baseline: 130 Variability: moderate Accels: 15x15 Decels: none  Toco: ui  Dilation: Closed Effacement (%): Thick Cervical Position: Posterior Exam by:: Ma Hillock CNM   MAU Course  Procedures Results for orders placed or performed during the hospital encounter of 12/10/18 (from the past 24 hour(s))  Urinalysis, Routine w reflex microscopic     Status: Abnormal   Collection Time: 12/10/18  1:15 PM  Result Value Ref Range   Color, Urine YELLOW YELLOW   APPearance CLEAR CLEAR   Specific Gravity, Urine 1.025 1.005 - 1.030   pH 7.0 5.0 - 8.0   Glucose, UA NEGATIVE NEGATIVE mg/dL   Hgb urine dipstick NEGATIVE NEGATIVE   Bilirubin Urine NEGATIVE NEGATIVE   Ketones, ur NEGATIVE NEGATIVE mg/dL   Protein, ur NEGATIVE NEGATIVE mg/dL   Nitrite NEGATIVE NEGATIVE   Leukocytes, UA SMALL (A) NEGATIVE  Urinalysis, Microscopic (reflex)     Status: Abnormal   Collection Time: 12/10/18  1:15 PM  Result Value Ref Range   RBC / HPF 0-5 0 - 5 RBC/hpf   WBC, UA 11-20 0 - 5 WBC/hpf   Bacteria, UA FEW (A) NONE SEEN   Squamous Epithelial / LPF 21-50 0 - 5   Non Squamous Epithelial PRESENT (A) NONE SEEN  Wet prep, genital     Status: Abnormal    Collection Time: 12/10/18  1:50 PM  Result Value Ref Range   Yeast Wet Prep HPF POC NONE SEEN NONE SEEN   Trich, Wet Prep NONE SEEN NONE SEEN   Clue Cells Wet Prep HPF POC NONE SEEN NONE SEEN   WBC, Wet Prep HPF POC MANY (A) NONE SEEN   Sperm NONE SEEN    MDM UA PO hydration Wet prep and gc/chlamydia Unable to do FFN due to recent intercourse  Assessment and Plan   1. Preterm contractions   2. Encounter for suspected premature rupture of amniotic membranes, with rupture of  membranes not found   3. [redacted] weeks gestation of pregnancy    -Discharge home in stable condition -Lengthy discussion with patient about importance of PO hydration in pregnancy -Preterm labor precautions discussed -Patient advised to follow-up with [facility] as scheduled for prenatal care -Patient may return to MAU as needed or if her condition were to change or worsen   Rolm Bookbinder CNM 12/10/2018, 2:32 PM

## 2018-12-10 NOTE — MAU Note (Signed)
Ctx since last night, thinks they are about every 15 min, feeling pain in abdomen and back pain at the same time  Unsure if LOF, having clear discharge, not having to wear a pad  No bleeding  + FM

## 2018-12-11 LAB — GC/CHLAMYDIA PROBE AMP (~~LOC~~) NOT AT ARMC
Chlamydia: NEGATIVE
Neisseria Gonorrhea: NEGATIVE

## 2018-12-28 ENCOUNTER — Encounter: Payer: Self-pay | Admitting: Obstetrics and Gynecology

## 2018-12-28 ENCOUNTER — Other Ambulatory Visit (HOSPITAL_COMMUNITY)
Admission: RE | Admit: 2018-12-28 | Discharge: 2018-12-28 | Disposition: A | Discharge status: Home / Self Care | Payer: Medicaid Other | Source: Ambulatory Visit | Attending: Obstetrics and Gynecology | Admitting: Obstetrics and Gynecology

## 2018-12-28 ENCOUNTER — Ambulatory Visit (INDEPENDENT_AMBULATORY_CARE_PROVIDER_SITE_OTHER): Payer: Medicaid Other | Admitting: Obstetrics and Gynecology

## 2018-12-28 VITALS — BP 114/69 | HR 90 | Wt 196.0 lb

## 2018-12-28 DIAGNOSIS — A599 Trichomoniasis, unspecified: Secondary | ICD-10-CM

## 2018-12-28 DIAGNOSIS — Z3490 Encounter for supervision of normal pregnancy, unspecified, unspecified trimester: Secondary | ICD-10-CM

## 2018-12-28 DIAGNOSIS — Z3493 Encounter for supervision of normal pregnancy, unspecified, third trimester: Secondary | ICD-10-CM

## 2018-12-28 LAB — OB RESULTS CONSOLE GC/CHLAMYDIA: Gonorrhea: NEGATIVE

## 2018-12-28 NOTE — Patient Instructions (Signed)
AREA PEDIATRIC/FAMILY PRACTICE PHYSICIANS  [facility] [address], [address] [place], [place]  [address] Phone - [phone]   Fax - [phone]  [facility] [address] [place], [place] [address] Phone - [phone]   Fax - [phone]  [facility] [address] [place], [place]  [address] Phone - [phone]   Fax - [phone]  [facility] [address] [place], [place]  [address] Phone - [phone]   Fax - [phone]  [facility] [address] [place], [place]  [address] Phone - [phone]   Fax - [phone]  [facility] [address] [place], [place]  [address] Phone - [phone]   Fax - [phone]  [facility] [address] [place], [place]  [address] Phone - [phone]   Fax - [phone]  [facility] [address] [place], [place]  [address] Phone - [phone]   Fax - [phone]  [facility] [address] [place], [place]  [address] Phone - [phone]   Fax - [phone] [facility] [address] [place], [place]  [address] Phone - [phone]   Fax - [phone]  [facility] [address] [place], [place]  [address] Phone - [phone]   Fax - [phone]  [facility] [address] [place], [place]  [address] Phone - [phone]   Fax - [phone]  [facility] [address] [place], [place]  [address] Phone - [phone]   Fax - [phone]  [facility] [address] [place], [place]  [address] Phone - [phone]  [facility] [address] [place], [place]  [address] Phone - [phone]   Fax - [phone]  [facility] [address] [place], [place]  [address] Phone - [phone]   Fax - [phone]  [facility] [address] [place], [place]  [address] Phone - [phone]   Fax - [phone]  [facility] [address] [place], [place]  [address] Phone - [phone]   Fax - [phone]   [facility] [address] [place], [place]  [address] Phone - [phone]   Fax - [phone]  [facility] [address] [place], [place]  [address] Phone - [phone]   Fax - [phone]  [facility] [address] [place], [place]  [address] Phone - [phone]   Fax - [phone]  [facility] [address] [place], [place]  [address] Phone - [phone]   Fax - [phone]  [facility] [address] , [place]  [address] Phone - [phone]   Fax - [phone] [facility] [address] [place], [place]  [address] Phone - [phone]   Fax - [phone]  [facility] [address] [place], [place]  [address] Phone - [phone]   Fax - [phone]   [facility] [address] [place], [place]  [address] Phone - [phone]   Fax - [phone]  [facility] Charlene Flemming MD [address] [place]  [address] Phone [phone]  Fax [phone]   

## 2018-12-28 NOTE — Progress Notes (Signed)
Cervix check

## 2018-12-28 NOTE — Progress Notes (Signed)
   PRENATAL VISIT NOTE  Subjective:  Claudia Miller is a 28 y.o. G3P2002 at [redacted]w[redacted]d being seen today for ongoing prenatal care.  She is currently monitored for the following issues for this low-risk pregnancy and has Supervision of normal pregnancy, antepartum; Trichomoniasis; and Hematuria on their problem list.  Patient reports occasional contractions.  Contractions: Irritability. Vag. Bleeding: None.  Movement: Present. Denies leaking of fluid.   The following portions of the patient's history were reviewed and updated as appropriate: allergies, current medications, past family history, past medical history, past social history, past surgical history and problem list. Problem list updated.  Objective:   Vitals:   12/28/18 1602  BP: 114/69  Pulse: 90  Weight: 196 lb (88.9 kg)    Fetal Status: Fetal Heart Rate (bpm): 140   Movement: Present     General:  Alert, oriented and cooperative. Patient is in no acute distress.  Skin: Skin is warm and dry. No rash noted.   Cardiovascular: Normal heart rate noted  Respiratory: Normal respiratory effort, no problems with respiration noted  Abdomen: Soft, gravid, appropriate for gestational age.  Pain/Pressure: Present     Pelvic: Cervical exam deferred        Extremities: Normal range of motion.  Edema: Trace  Mental Status: Normal mood and affect. Normal behavior. Normal judgment and thought content.   Assessment and Plan:  Pregnancy: G3P2002 at [redacted]w[redacted]d  1. Encounter for supervision of normal pregnancy, antepartum, unspecified gravidity - Cervicovaginal ancillary only( [place]) - Strep Gp B NAA  2. Trichomoniasis TOC negative  Preterm labor symptoms and general obstetric precautions including but not limited to vaginal bleeding, contractions, leaking of fluid and fetal movement were reviewed in detail with the patient. Please refer to After Visit Summary for other counseling recommendations.  Return in about 1 week (around 01/04/2019)  for OB visit.  Future Appointments  Date Time Provider Department Center  01/05/2019 10:30 AM Adam Phenix, MD [facility] None  01/11/2019  9:45 AM Constant, Gigi Gin, MD [facility] None    Conan Bowens, MD

## 2018-12-29 LAB — CERVICOVAGINAL ANCILLARY ONLY
Chlamydia: NEGATIVE
Neisseria Gonorrhea: NEGATIVE

## 2018-12-30 LAB — STREP GP B NAA: Strep Gp B NAA: NEGATIVE

## 2019-01-05 ENCOUNTER — Telehealth: Payer: Self-pay

## 2019-01-05 ENCOUNTER — Ambulatory Visit (INDEPENDENT_AMBULATORY_CARE_PROVIDER_SITE_OTHER): Payer: Medicaid Other | Admitting: Obstetrics & Gynecology

## 2019-01-05 DIAGNOSIS — Z3493 Encounter for supervision of normal pregnancy, unspecified, third trimester: Secondary | ICD-10-CM

## 2019-01-05 DIAGNOSIS — Z3490 Encounter for supervision of normal pregnancy, unspecified, unspecified trimester: Secondary | ICD-10-CM

## 2019-01-05 DIAGNOSIS — Z3A37 37 weeks gestation of pregnancy: Secondary | ICD-10-CM

## 2019-01-05 NOTE — Patient Instructions (Signed)

## 2019-01-05 NOTE — Progress Notes (Signed)
Patient reports fetal movement with irregular contractions. 

## 2019-01-05 NOTE — Progress Notes (Signed)
   PRENATAL VISIT NOTE  Subjective:  Claudia Miller is a 28 y.o. G3P2002 at 1557w2d being seen today for ongoing prenatal care.  She is currently monitored for the following issues for this low-risk pregnancy and has Supervision of normal pregnancy, antepartum; Trichomoniasis; and Hematuria on their problem list.  Patient reports no complaints.  Contractions: Irregular. Vag. Bleeding: None.  Movement: Present. Denies leaking of fluid.   The following portions of the patient's history were reviewed and updated as appropriate: allergies, current medications, past family history, past medical history, past social history, past surgical history and problem list. Problem list updated.  Objective:   Vitals:   01/05/19 1036  BP: 125/75  Pulse: 88  Weight: 192 lb 1.6 oz (87.1 kg)    Fetal Status: Fetal Heart Rate (bpm): 140   Movement: Present     General:  Alert, oriented and cooperative. Patient is in no acute distress.  Skin: Skin is warm and dry. No rash noted.   Cardiovascular: Normal heart rate noted  Respiratory: Normal respiratory effort, no problems with respiration noted  Abdomen: Soft, gravid, appropriate for gestational age.  Pain/Pressure: Absent     Pelvic: Cervical exam deferred        Extremities: Normal range of motion.  Edema: Trace  Mental Status: Normal mood and affect. Normal behavior. Normal judgment and thought content.   Assessment and Plan:  Pregnancy: G3P2002 at 6657w2d  1. Encounter for supervision of normal pregnancy, antepartum, unspecified gravidity GBS negative  Term labor symptoms and general obstetric precautions including but not limited to vaginal bleeding, contractions, leaking of fluid and fetal movement were reviewed in detail with the patient. Please refer to After Visit Summary for other counseling recommendations.  Return in about 1 week (around 01/12/2019).  Future Appointments  Date Time Provider Department Center  01/11/2019  9:45 AM Constant,  Gigi GinPeggy, MD [facility] None    Scheryl DarterJames Arnold, MD

## 2019-01-05 NOTE — Telephone Encounter (Signed)
Contacted pt and advised to take BP weekly for babyscripts, pt agreed.

## 2019-01-11 ENCOUNTER — Ambulatory Visit (INDEPENDENT_AMBULATORY_CARE_PROVIDER_SITE_OTHER): Payer: Medicaid Other | Admitting: Obstetrics and Gynecology

## 2019-01-11 ENCOUNTER — Encounter: Payer: Self-pay | Admitting: Obstetrics and Gynecology

## 2019-01-11 VITALS — BP 113/69 | HR 87 | Wt 194.6 lb

## 2019-01-11 DIAGNOSIS — O339 Maternal care for disproportion, unspecified: Secondary | ICD-10-CM | POA: Diagnosis not present

## 2019-01-11 DIAGNOSIS — Z3480 Encounter for supervision of other normal pregnancy, unspecified trimester: Secondary | ICD-10-CM

## 2019-01-11 MED ORDER — COMFORT FIT MATERNITY SUPP LG MISC: 0 refills | Status: DC

## 2019-01-11 NOTE — Progress Notes (Signed)
   PRENATAL VISIT NOTE  Subjective:  Claudia Miller is a 28 y.o. G3P2002 at [redacted]w[redacted]d being seen today for ongoing prenatal care.  She is currently monitored for the following issues for this low-risk pregnancy and has Supervision of normal pregnancy, antepartum; Trichomoniasis; and Hematuria on their problem list.  Patient reports backache.  Contractions: Irregular. Vag. Bleeding: None.  Movement: Present. Denies leaking of fluid.   The following portions of the patient's history were reviewed and updated as appropriate: allergies, current medications, past family history, past medical history, past social history, past surgical history and problem list. Problem list updated.  Objective:   Vitals:   01/11/19 1001  BP: 113/69  Pulse: 87  Weight: 194 lb 9.6 oz (88.3 kg)    Fetal Status: Fetal Heart Rate (bpm): 128 Fundal Height: 37 cm Movement: Present     General:  Alert, oriented and cooperative. Patient is in no acute distress.  Skin: Skin is warm and dry. No rash noted.   Cardiovascular: Normal heart rate noted  Respiratory: Normal respiratory effort, no problems with respiration noted  Abdomen: Soft, gravid, appropriate for gestational age.  Pain/Pressure: Present     Pelvic: Cervical exam deferred        Extremities: Normal range of motion.  Edema: Trace  Mental Status: Normal mood and affect. Normal behavior. Normal judgment and thought content.   Assessment and Plan:  Pregnancy: G3P2002 at [redacted]w[redacted]d  1. Supervision of other normal pregnancy, antepartum Patient is doing well Rx maternity support beltp provided to ease pelvic and lower back pain  Term labor symptoms and general obstetric precautions including but not limited to vaginal bleeding, contractions, leaking of fluid and fetal movement were reviewed in detail with the patient. Please refer to After Visit Summary for other counseling recommendations.  Return in about 1 week (around 01/18/2019) for ROB.  No future  appointments.  Catalina Antigua, MD

## 2019-01-11 NOTE — Progress Notes (Signed)
Pt is here for ROB. [redacted]w[redacted]d.

## 2019-01-18 ENCOUNTER — Encounter: Payer: Medicaid Other | Admitting: Obstetrics & Gynecology

## 2019-01-22 ENCOUNTER — Encounter (HOSPITAL_COMMUNITY): Payer: Self-pay

## 2019-01-22 ENCOUNTER — Inpatient Hospital Stay (HOSPITAL_COMMUNITY)
Admission: AD | Admit: 2019-01-22 | Discharge: 2019-01-25 | DRG: 787 | Disposition: A | Discharge status: Home / Self Care | Payer: Medicaid Other | Attending: Obstetrics & Gynecology | Admitting: Obstetrics & Gynecology

## 2019-01-22 DIAGNOSIS — A599 Trichomoniasis, unspecified: Secondary | ICD-10-CM | POA: Diagnosis present

## 2019-01-22 DIAGNOSIS — Z3A00 Weeks of gestation of pregnancy not specified: Secondary | ICD-10-CM | POA: Diagnosis not present

## 2019-01-22 DIAGNOSIS — O9081 Anemia of the puerperium: Secondary | ICD-10-CM | POA: Diagnosis not present

## 2019-01-22 DIAGNOSIS — O4292 Full-term premature rupture of membranes, unspecified as to length of time between rupture and onset of labor: Secondary | ICD-10-CM | POA: Diagnosis present

## 2019-01-22 DIAGNOSIS — O34219 Maternal care for unspecified type scar from previous cesarean delivery: Secondary | ICD-10-CM

## 2019-01-22 DIAGNOSIS — Z3A39 39 weeks gestation of pregnancy: Secondary | ICD-10-CM

## 2019-01-22 DIAGNOSIS — D62 Acute posthemorrhagic anemia: Secondary | ICD-10-CM | POA: Diagnosis not present

## 2019-01-22 DIAGNOSIS — Z98891 History of uterine scar from previous surgery: Secondary | ICD-10-CM

## 2019-01-22 DIAGNOSIS — Z87891 Personal history of nicotine dependence: Secondary | ICD-10-CM | POA: Diagnosis not present

## 2019-01-22 DIAGNOSIS — O4290 Premature rupture of membranes, unspecified as to length of time between rupture and onset of labor, unspecified weeks of gestation: Secondary | ICD-10-CM | POA: Diagnosis present

## 2019-01-22 DIAGNOSIS — O4202 Full-term premature rupture of membranes, onset of labor within 24 hours of rupture: Secondary | ICD-10-CM | POA: Diagnosis not present

## 2019-01-22 LAB — CBC
HCT: 35.6 % — ABNORMAL LOW (ref 36.0–46.0)
Hemoglobin: 11.6 g/dL — ABNORMAL LOW (ref 12.0–15.0)
MCH: 29.2 pg (ref 26.0–34.0)
MCHC: 32.6 g/dL (ref 30.0–36.0)
MCV: 89.7 fL (ref 80.0–100.0)
Platelets: 200 10*3/uL (ref 150–400)
RBC: 3.97 MIL/uL (ref 3.87–5.11)
RDW: 13.6 % (ref 11.5–15.5)
WBC: 6.4 10*3/uL (ref 4.0–10.5)
nRBC: 0 % (ref 0.0–0.2)

## 2019-01-22 MED ORDER — LACTATED RINGERS IV SOLN
INTRAVENOUS | Status: DC
  Administered 2019-01-22 – 2019-01-23 (×5): via INTRAVENOUS

## 2019-01-22 MED ORDER — FENTANYL CITRATE (PF) 100 MCG/2ML IJ SOLN: 100.0000 ug | INTRAMUSCULAR | Status: DC | PRN

## 2019-01-22 MED ORDER — LACTATED RINGERS IV SOLN
500.0000 mL | INTRAVENOUS | Status: DC | PRN
  Administered 2019-01-23 (×2): 500 mL via INTRAVENOUS

## 2019-01-22 MED ORDER — SOD CITRATE-CITRIC ACID 500-334 MG/5ML PO SOLN
30.0000 mL | ORAL | Status: DC | PRN
  Administered 2019-01-23: 30 mL via ORAL
  Filled 2019-01-22: qty 15

## 2019-01-22 MED ORDER — OXYTOCIN BOLUS FROM INFUSION: 500.0000 mL | Freq: Once | INTRAVENOUS | Status: DC

## 2019-01-22 MED ORDER — FLEET ENEMA 7-19 GM/118ML RE ENEM: 1.0000 | ENEMA | RECTAL | Status: DC | PRN

## 2019-01-22 MED ORDER — ONDANSETRON HCL 4 MG/2ML IJ SOLN
4.0000 mg | Freq: Four times a day (QID) | INTRAMUSCULAR | Status: DC | PRN
  Administered 2019-01-23: 4 mg via INTRAVENOUS
  Filled 2019-01-22: qty 2

## 2019-01-22 MED ORDER — OXYTOCIN 40 UNITS IN NORMAL SALINE INFUSION - SIMPLE MED
2.5000 [IU]/h | INTRAVENOUS | Status: DC
  Filled 2019-01-22: qty 1000

## 2019-01-22 MED ORDER — LIDOCAINE HCL (PF) 1 % IJ SOLN: 30.0000 mL | INTRAMUSCULAR | Status: DC | PRN

## 2019-01-22 NOTE — MAU Note (Signed)
ROM at 2130.  Hasn't timed ctx.  Was 2.5 cm last exam.  +FM.  No VB.

## 2019-01-22 NOTE — H&P (Addendum)
OBSTETRIC ADMISSION HISTORY AND PHYSICAL  Claudia Miller is a 28 y.o. female G3P2002 with IUP at 9256w5d by L/6 presenting for spontaneous onset of labor and PROM with thick meconium around 2130. Knew something was going on today - had some nausea, diarrhea, felt tired. Feeling occasional contractions but not much, more just back discomfort.  Reports fetal movement. Denies vaginal bleeding.  [facility]  Support person in labor: unsure - thinks a cousin may be coming   Ultrasounds . 6w4: viability ultrasound . 31w2: delayed anatomy U/S, appropriate fetal growth noted, EFW 1424g, posterior fundal placenta  Prenatal History/Complications:  Trichomonal vaginitis - negative TOC  Bacterial vaginosis  Limited prenatal care in 2nd trimester - no visits between 16 weeks and 28 weeks, lack of transportation   Pelvic pain - improved with maternity belt   Past Medical History: Past Medical History:  Diagnosis Date  . DVT (deep venous thrombosis) (HCC)     Past Surgical History: Past Surgical History:  Procedure Laterality Date  . NO PAST SURGERIES      Obstetrical History: OB History    Gravida  3   Para  2   Term  2   Preterm      AB      Living  2     SAB      TAB      Ectopic      Multiple      Live Births  2           Social History: Social History   Socioeconomic History  . Marital status: Single    Spouse name: Not on file  . Number of children: 2  . Years of education: Not on file  . Highest education level: Not on file  Occupational History  . Not on file  Social Needs  . Financial resource strain: Not on file  . Food insecurity:    Worry: Not on file    Inability: Not on file  . Transportation needs:    Medical: Not on file    Non-medical: Not on file  Tobacco Use  . Smoking status: Former Smoker    Packs/day: 0.00    Types: Cigarettes    Last attempt to quit: 05/31/2018    Years since quitting: 0.6  .  Smokeless tobacco: Never Used  Substance and Sexual Activity  . Alcohol use: No  . Drug use: No  . Sexual activity: Yes    Birth control/protection: None  Lifestyle  . Physical activity:    Days per week: Not on file    Minutes per session: Not on file  . Stress: Not on file  Relationships  . Social connections:    Talks on phone: Not on file    Gets together: Not on file    Attends religious service: Not on file    Active member of club or organization: Not on file    Attends meetings of clubs or organizations: Not on file    Relationship status: Not on file  Other Topics Concern  . Not on file  Social History Narrative  . Not on file    Family History: Family History  Problem Relation Age of Onset  . Asthma Mother   . Diabetes Mother   . Hypertension Mother   . Cancer Father   . Hypertension Father   . Asthma Sister   . Hypertension Sister   . Asthma Brother   . Hypertension Brother  Allergies: Allergies  Allergen Reactions  . Fish Allergy Anaphylaxis    Medications Prior to Admission  Medication Sig Dispense Refill Last Dose  . Prenat w/o A Vit-FeFum-FePo-FA (PROVIDA OB) 20-20-1.25 MG CAPS Take 1 capsule by mouth daily before breakfast. 90 capsule 4 Taking     Review of Systems  All systems reviewed and negative except as stated in HPI  Blood pressure (!) 117/52, pulse 84, temperature 99.2 F (37.3 C), temperature source Oral, resp. rate 18, height 5\' 1"  (1.549 m), weight 88 kg, last menstrual period 04/04/2018, unknown if currently breastfeeding. General appearance: well-appearing, NAD Lungs: no respiratory distress Heart: regular rate  Abdomen: soft, non-tender; gravid Pelvic: deferred Extremities: no lower extremity edema  Presentation: cephalic by RN check  Fetal monitoring: 130s/mod/+a/one early decel Uterine activity: every 2-4 minutes Dilation: 2.5 Effacement (%): 50, 60 Station: -2 Exam by:: Ralene Muskrat, RN  Prenatal labs: ABO, Rh:  --/--/A POS (01/27 2232) Antibody: PENDING (01/27 2232) Rubella: 5.96 (07/17 1211) RPR: Non Reactive (11/07 1050)  HBsAg: Negative (07/17 1211)  HIV: Non Reactive (11/07 1050)  GBS: Negative (01/02 1642)  Glucola: normal 2-hr Genetic screening:  Low risk NIPS  Prenatal Transfer Tool  Maternal Diabetes: No Genetic Screening: Normal Maternal Ultrasounds/Referrals: Normal Fetal Ultrasounds or other Referrals:  None Maternal Substance Abuse:  No Significant Maternal Medications:  None Significant Maternal Lab Results: None  Results for orders placed or performed during the hospital encounter of 01/22/19 (from the past 24 hour(s))  CBC   Collection Time: 01/22/19 10:32 PM  Result Value Ref Range   WBC 6.4 4.0 - 10.5 K/uL   RBC 3.97 3.87 - 5.11 MIL/uL   Hemoglobin 11.6 (L) 12.0 - 15.0 g/dL   HCT 62.2 (L) 63.3 - 35.4 %   MCV 89.7 80.0 - 100.0 fL   MCH 29.2 26.0 - 34.0 pg   MCHC 32.6 30.0 - 36.0 g/dL   RDW 56.2 56.3 - 89.3 %   Platelets 200 150 - 400 K/uL   nRBC 0.0 0.0 - 0.2 %  Type and screen [facility]   Collection Time: 01/22/19 10:32 PM  Result Value Ref Range   ABO/RH(D) A POS    Antibody Screen PENDING    Sample Expiration      01/25/2019 Performed at [facility], [address] [place], [place] [address]     Patient Active Problem List   Diagnosis Date Noted  . PROM (premature rupture of membranes) 01/22/2019  . Hematuria 08/15/2018  . Trichomoniasis 08/09/2018  . Supervision of normal pregnancy, antepartum 07/12/2018    Assessment/Plan:  Claudia Miller is a 28 y.o. G3P2002 at [redacted]w[redacted]d here for PROM.  Labor: Expectant management. PROM at 2130 1/27. Will consider starting pitocin if no interval cervical change by next check, contracting irregularly on own but not feeling them regularly.  -- pain control: desires epidural  Fetal Wellbeing: EFW 6-7lbs by Leopold's. Cephalic by RN check.  -- GBS (negative) -- continuous fetal monitoring -  category I   Postpartum Planning -- bottle/depo -- RI/[x] Tdap/[x] flu    Claudia Petrovich S. Earlene Plater, DO OB/GYN Fellow

## 2019-01-23 ENCOUNTER — Other Ambulatory Visit: Payer: Self-pay

## 2019-01-23 ENCOUNTER — Inpatient Hospital Stay (HOSPITAL_COMMUNITY): Payer: Medicaid Other | Admitting: Anesthesiology

## 2019-01-23 ENCOUNTER — Encounter (HOSPITAL_COMMUNITY): Payer: Self-pay | Admitting: Registered Nurse

## 2019-01-23 ENCOUNTER — Encounter: Payer: Medicaid Other | Admitting: Obstetrics & Gynecology

## 2019-01-23 ENCOUNTER — Encounter (HOSPITAL_COMMUNITY)
Admission: AD | Disposition: A | Discharge status: Home / Self Care | Payer: Self-pay | Source: Home / Self Care | Attending: Obstetrics & Gynecology

## 2019-01-23 DIAGNOSIS — O34219 Maternal care for unspecified type scar from previous cesarean delivery: Secondary | ICD-10-CM

## 2019-01-23 DIAGNOSIS — Z98891 History of uterine scar from previous surgery: Secondary | ICD-10-CM

## 2019-01-23 DIAGNOSIS — Z3A39 39 weeks gestation of pregnancy: Secondary | ICD-10-CM

## 2019-01-23 DIAGNOSIS — O4202 Full-term premature rupture of membranes, onset of labor within 24 hours of rupture: Secondary | ICD-10-CM

## 2019-01-23 LAB — TYPE AND SCREEN
ABO/RH(D): A POS
ANTIBODY SCREEN: NEGATIVE

## 2019-01-23 LAB — RPR: RPR Ser Ql: NONREACTIVE

## 2019-01-23 SURGERY — Surgical Case
Anesthesia: Epidural | Site: Abdomen | Wound class: Clean Contaminated

## 2019-01-23 MED ORDER — KETOROLAC TROMETHAMINE 30 MG/ML IJ SOLN: 30.0000 mg | Freq: Four times a day (QID) | INTRAMUSCULAR | Status: AC | PRN

## 2019-01-23 MED ORDER — MEPERIDINE HCL 25 MG/ML IJ SOLN: 6.2500 mg | INTRAMUSCULAR | Status: DC | PRN

## 2019-01-23 MED ORDER — OXYTOCIN 10 UNIT/ML IJ SOLN
INTRAVENOUS | Status: DC | PRN
  Administered 2019-01-23: 40 [IU] via INTRAVENOUS

## 2019-01-23 MED ORDER — LACTATED RINGERS IV SOLN
INTRAVENOUS | Status: DC
  Administered 2019-01-23 (×2): via INTRAVENOUS

## 2019-01-23 MED ORDER — KETOROLAC TROMETHAMINE 30 MG/ML IJ SOLN
30.0000 mg | Freq: Four times a day (QID) | INTRAMUSCULAR | Status: AC
  Administered 2019-01-23 – 2019-01-24 (×3): 30 mg via INTRAVENOUS
  Filled 2019-01-23 (×3): qty 1

## 2019-01-23 MED ORDER — PHENYLEPHRINE 40 MCG/ML (10ML) SYRINGE FOR IV PUSH (FOR BLOOD PRESSURE SUPPORT)
80.0000 ug | PREFILLED_SYRINGE | INTRAVENOUS | Status: DC | PRN
  Administered 2019-01-23: 80 ug via INTRAVENOUS

## 2019-01-23 MED ORDER — NALBUPHINE HCL 10 MG/ML IJ SOLN: 5.0000 mg | Freq: Once | INTRAMUSCULAR | Status: DC | PRN

## 2019-01-23 MED ORDER — OXYTOCIN 40 UNITS IN NORMAL SALINE INFUSION - SIMPLE MED: 2.5000 [IU]/h | INTRAVENOUS | Status: AC

## 2019-01-23 MED ORDER — SENNOSIDES-DOCUSATE SODIUM 8.6-50 MG PO TABS
2.0000 | ORAL_TABLET | ORAL | Status: DC
  Administered 2019-01-24 (×2): 2 via ORAL
  Filled 2019-01-23 (×2): qty 2

## 2019-01-23 MED ORDER — ENOXAPARIN SODIUM 40 MG/0.4ML ~~LOC~~ SOLN
40.0000 mg | SUBCUTANEOUS | Status: DC
  Administered 2019-01-24 – 2019-01-25 (×2): 40 mg via SUBCUTANEOUS
  Filled 2019-01-23 (×2): qty 0.4

## 2019-01-23 MED ORDER — EPHEDRINE 5 MG/ML INJ: 10.0000 mg | INTRAVENOUS | Status: DC | PRN

## 2019-01-23 MED ORDER — OXYTOCIN 40 UNITS IN NORMAL SALINE INFUSION - SIMPLE MED
1.0000 m[IU]/min | INTRAVENOUS | Status: DC
  Administered 2019-01-23 (×2): 2 m[IU]/min via INTRAVENOUS

## 2019-01-23 MED ORDER — SODIUM CHLORIDE 0.9% FLUSH
3.0000 mL | INTRAVENOUS | Status: DC | PRN
  Administered 2019-01-24 – 2019-01-25 (×2): 3 mL via INTRAVENOUS
  Filled 2019-01-23 (×2): qty 3

## 2019-01-23 MED ORDER — MORPHINE SULFATE (PF) 0.5 MG/ML IJ SOLN
INTRAMUSCULAR | Status: DC | PRN
  Administered 2019-01-23: 3 mg via EPIDURAL
  Administered 2019-01-23: 2 mg via INTRAVENOUS

## 2019-01-23 MED ORDER — OXYCODONE-ACETAMINOPHEN 5-325 MG PO TABS: 1.0000 | ORAL_TABLET | ORAL | Status: DC | PRN

## 2019-01-23 MED ORDER — MENTHOL 3 MG MT LOZG: 1.0000 | LOZENGE | OROMUCOSAL | Status: DC | PRN

## 2019-01-23 MED ORDER — OXYTOCIN 10 UNIT/ML IJ SOLN
INTRAMUSCULAR | Status: AC
  Filled 2019-01-23: qty 4

## 2019-01-23 MED ORDER — NALOXONE HCL 4 MG/10ML IJ SOLN
1.0000 ug/kg/h | INTRAVENOUS | Status: DC | PRN
  Filled 2019-01-23: qty 5

## 2019-01-23 MED ORDER — METHYLERGONOVINE MALEATE 0.2 MG/ML IJ SOLN
INTRAMUSCULAR | Status: AC
  Filled 2019-01-23: qty 1

## 2019-01-23 MED ORDER — TERBUTALINE SULFATE 1 MG/ML IJ SOLN
0.2500 mg | Freq: Once | INTRAMUSCULAR | Status: AC | PRN
  Administered 2019-01-23: 0.25 mg via SUBCUTANEOUS
  Filled 2019-01-23: qty 1

## 2019-01-23 MED ORDER — FENTANYL 2.5 MCG/ML BUPIVACAINE 1/10 % EPIDURAL INFUSION (WH - ANES)
14.0000 mL/h | INTRAMUSCULAR | Status: DC | PRN
  Administered 2019-01-23: 14 mL/h via EPIDURAL
  Filled 2019-01-23: qty 100

## 2019-01-23 MED ORDER — LACTATED RINGERS IV SOLN
500.0000 mL | Freq: Once | INTRAVENOUS | Status: AC
  Administered 2019-01-23: 500 mL via INTRAVENOUS

## 2019-01-23 MED ORDER — SIMETHICONE 80 MG PO CHEW
80.0000 mg | CHEWABLE_TABLET | ORAL | Status: DC
  Administered 2019-01-24 (×2): 80 mg via ORAL
  Filled 2019-01-23 (×2): qty 1

## 2019-01-23 MED ORDER — PRENATAL MULTIVITAMIN CH
1.0000 | ORAL_TABLET | Freq: Every day | ORAL | Status: DC
  Administered 2019-01-24 – 2019-01-25 (×2): 1 via ORAL
  Filled 2019-01-23 (×2): qty 1

## 2019-01-23 MED ORDER — NALBUPHINE HCL 10 MG/ML IJ SOLN: 5.0000 mg | INTRAMUSCULAR | Status: DC | PRN

## 2019-01-23 MED ORDER — MEASLES, MUMPS & RUBELLA VAC IJ SOLR
0.5000 mL | Freq: Once | INTRAMUSCULAR | Status: DC
  Filled 2019-01-23: qty 0.5

## 2019-01-23 MED ORDER — WITCH HAZEL-GLYCERIN EX PADS: 1.0000 "application " | MEDICATED_PAD | CUTANEOUS | Status: DC | PRN

## 2019-01-23 MED ORDER — NALOXONE HCL 0.4 MG/ML IJ SOLN: 0.4000 mg | INTRAMUSCULAR | Status: DC | PRN

## 2019-01-23 MED ORDER — DIBUCAINE 1 % RE OINT: 1.0000 "application " | TOPICAL_OINTMENT | RECTAL | Status: DC | PRN

## 2019-01-23 MED ORDER — ONDANSETRON HCL 4 MG/2ML IJ SOLN: 4.0000 mg | Freq: Three times a day (TID) | INTRAMUSCULAR | Status: DC | PRN

## 2019-01-23 MED ORDER — LIDOCAINE HCL (PF) 1 % IJ SOLN
INTRAMUSCULAR | Status: DC | PRN
  Administered 2019-01-23 (×2): 5 mL via EPIDURAL

## 2019-01-23 MED ORDER — GABAPENTIN 300 MG PO CAPS
300.0000 mg | ORAL_CAPSULE | Freq: Two times a day (BID) | ORAL | Status: DC
  Administered 2019-01-24 – 2019-01-25 (×3): 300 mg via ORAL
  Filled 2019-01-23 (×7): qty 1

## 2019-01-23 MED ORDER — DIPHENHYDRAMINE HCL 25 MG PO CAPS: 25.0000 mg | ORAL_CAPSULE | Freq: Four times a day (QID) | ORAL | Status: DC | PRN

## 2019-01-23 MED ORDER — ONDANSETRON HCL 4 MG/2ML IJ SOLN
INTRAMUSCULAR | Status: AC
  Filled 2019-01-23: qty 2

## 2019-01-23 MED ORDER — SODIUM CHLORIDE 0.9 % IV SOLN
500.0000 mg | Freq: Once | INTRAVENOUS | Status: AC
  Administered 2019-01-23: 250 mg via INTRAVENOUS
  Filled 2019-01-23 (×3): qty 500

## 2019-01-23 MED ORDER — ACETAMINOPHEN 10 MG/ML IV SOLN: 1000.0000 mg | Freq: Once | INTRAVENOUS | Status: DC | PRN

## 2019-01-23 MED ORDER — METHYLERGONOVINE MALEATE 0.2 MG/ML IJ SOLN
INTRAMUSCULAR | Status: DC | PRN
  Administered 2019-01-23: 0.2 mg via INTRAMUSCULAR

## 2019-01-23 MED ORDER — FENTANYL CITRATE (PF) 100 MCG/2ML IJ SOLN
INTRAMUSCULAR | Status: DC | PRN
  Administered 2019-01-23 (×2): 50 ug via INTRAVENOUS

## 2019-01-23 MED ORDER — TETANUS-DIPHTH-ACELL PERTUSSIS 5-2.5-18.5 LF-MCG/0.5 IM SUSP: 0.5000 mL | Freq: Once | INTRAMUSCULAR | Status: DC

## 2019-01-23 MED ORDER — HYDROMORPHONE HCL 1 MG/ML IJ SOLN: 0.2500 mg | INTRAMUSCULAR | Status: DC | PRN

## 2019-01-23 MED ORDER — SIMETHICONE 80 MG PO CHEW
80.0000 mg | CHEWABLE_TABLET | ORAL | Status: DC | PRN
  Administered 2019-01-24: 80 mg via ORAL
  Filled 2019-01-23: qty 1

## 2019-01-23 MED ORDER — DIPHENHYDRAMINE HCL 50 MG/ML IJ SOLN: 12.5000 mg | INTRAMUSCULAR | Status: DC | PRN

## 2019-01-23 MED ORDER — PHENYLEPHRINE 40 MCG/ML (10ML) SYRINGE FOR IV PUSH (FOR BLOOD PRESSURE SUPPORT)
80.0000 ug | PREFILLED_SYRINGE | INTRAVENOUS | Status: DC | PRN
  Filled 2019-01-23: qty 10

## 2019-01-23 MED ORDER — CEFAZOLIN SODIUM-DEXTROSE 2-3 GM-%(50ML) IV SOLR
INTRAVENOUS | Status: DC | PRN
  Administered 2019-01-23: 2 g via INTRAVENOUS

## 2019-01-23 MED ORDER — STERILE WATER FOR IRRIGATION IR SOLN
Status: DC | PRN
  Administered 2019-01-23: 1000 mL

## 2019-01-23 MED ORDER — SODIUM CHLORIDE 0.9 % IR SOLN
Status: DC | PRN
  Administered 2019-01-23: 1000 mL

## 2019-01-23 MED ORDER — LACTATED RINGERS IV SOLN
INTRAVENOUS | Status: DC | PRN
  Administered 2019-01-23: 07:00:00 via INTRAVENOUS

## 2019-01-23 MED ORDER — DEXAMETHASONE SODIUM PHOSPHATE 4 MG/ML IJ SOLN
INTRAMUSCULAR | Status: AC
  Filled 2019-01-23: qty 1

## 2019-01-23 MED ORDER — MORPHINE SULFATE (PF) 0.5 MG/ML IJ SOLN
INTRAMUSCULAR | Status: AC
  Filled 2019-01-23: qty 10

## 2019-01-23 MED ORDER — FERROUS SULFATE 325 (65 FE) MG PO TABS
325.0000 mg | ORAL_TABLET | Freq: Two times a day (BID) | ORAL | Status: DC
  Administered 2019-01-24 – 2019-01-25 (×3): 325 mg via ORAL
  Filled 2019-01-23 (×3): qty 1

## 2019-01-23 MED ORDER — DEXAMETHASONE SODIUM PHOSPHATE 4 MG/ML IJ SOLN
INTRAMUSCULAR | Status: DC | PRN
  Administered 2019-01-23: 4 mg via INTRAVENOUS

## 2019-01-23 MED ORDER — DIPHENHYDRAMINE HCL 25 MG PO CAPS
25.0000 mg | ORAL_CAPSULE | ORAL | Status: DC | PRN
  Filled 2019-01-23: qty 1

## 2019-01-23 MED ORDER — TRAMADOL HCL 50 MG PO TABS: 50.0000 mg | ORAL_TABLET | Freq: Four times a day (QID) | ORAL | Status: DC | PRN

## 2019-01-23 MED ORDER — MAGNESIUM HYDROXIDE 400 MG/5ML PO SUSP: 30.0000 mL | ORAL | Status: DC | PRN

## 2019-01-23 MED ORDER — HYDROCODONE-ACETAMINOPHEN 7.5-325 MG PO TABS: 1.0000 | ORAL_TABLET | Freq: Once | ORAL | Status: DC | PRN

## 2019-01-23 MED ORDER — ONDANSETRON HCL 4 MG/2ML IJ SOLN
INTRAMUSCULAR | Status: DC | PRN
  Administered 2019-01-23: 4 mg via INTRAVENOUS

## 2019-01-23 MED ORDER — PROMETHAZINE HCL 25 MG/ML IJ SOLN: 6.2500 mg | INTRAMUSCULAR | Status: DC | PRN

## 2019-01-23 MED ORDER — KETOROLAC TROMETHAMINE 30 MG/ML IJ SOLN
30.0000 mg | Freq: Four times a day (QID) | INTRAMUSCULAR | Status: AC | PRN
  Administered 2019-01-23: 30 mg via INTRAMUSCULAR

## 2019-01-23 MED ORDER — FENTANYL CITRATE (PF) 100 MCG/2ML IJ SOLN
INTRAMUSCULAR | Status: AC
  Filled 2019-01-23: qty 2

## 2019-01-23 MED ORDER — NALBUPHINE HCL 10 MG/ML IJ SOLN
5.0000 mg | INTRAMUSCULAR | Status: DC | PRN
  Administered 2019-01-23 (×3): 5 mg via INTRAVENOUS
  Filled 2019-01-23 (×3): qty 1

## 2019-01-23 MED ORDER — IBUPROFEN 800 MG PO TABS: 800.0000 mg | ORAL_TABLET | Freq: Four times a day (QID) | ORAL | Status: DC

## 2019-01-23 MED ORDER — LACTATED RINGERS AMNIOINFUSION: INTRAVENOUS | Status: DC

## 2019-01-23 MED ORDER — COCONUT OIL OIL: 1.0000 "application " | TOPICAL_OIL | Status: DC | PRN

## 2019-01-23 MED ORDER — HYDROMORPHONE HCL 1 MG/ML IJ SOLN: 1.0000 mg | INTRAMUSCULAR | Status: DC | PRN

## 2019-01-23 MED ORDER — OXYCODONE-ACETAMINOPHEN 5-325 MG PO TABS: 2.0000 | ORAL_TABLET | ORAL | Status: DC | PRN

## 2019-01-23 MED ORDER — SODIUM CHLORIDE 0.9 % IV SOLN: 500.0000 mg | Freq: Once | INTRAVENOUS | Status: DC

## 2019-01-23 MED ORDER — TERBUTALINE SULFATE 1 MG/ML IJ SOLN
INTRAMUSCULAR | Status: AC
  Administered 2019-01-23: 0.25 mg
  Filled 2019-01-23: qty 1

## 2019-01-23 MED ORDER — SCOPOLAMINE 1 MG/3DAYS TD PT72: 1.0000 | MEDICATED_PATCH | Freq: Once | TRANSDERMAL | Status: DC

## 2019-01-23 MED ORDER — SODIUM BICARBONATE 8.4 % IV SOLN
INTRAVENOUS | Status: DC | PRN
  Administered 2019-01-23 (×3): 5 mL via EPIDURAL

## 2019-01-23 MED ORDER — CEFAZOLIN SODIUM-DEXTROSE 2-4 GM/100ML-% IV SOLN
INTRAVENOUS | Status: AC
  Filled 2019-01-23: qty 100

## 2019-01-23 MED ORDER — ZOLPIDEM TARTRATE 5 MG PO TABS: 5.0000 mg | ORAL_TABLET | Freq: Every evening | ORAL | Status: DC | PRN

## 2019-01-23 MED ORDER — KETOROLAC TROMETHAMINE 30 MG/ML IJ SOLN
INTRAMUSCULAR | Status: AC
  Filled 2019-01-23: qty 1

## 2019-01-23 SURGICAL SUPPLY — 29 items
CHLORAPREP W/TINT 26ML (MISCELLANEOUS) ×2 IMPLANT
CLAMP CORD UMBIL (MISCELLANEOUS) ×2 IMPLANT
CLOTH BEACON ORANGE TIMEOUT ST (SAFETY) ×2 IMPLANT
DRSG OPSITE POSTOP 4X10 (GAUZE/BANDAGES/DRESSINGS) ×2 IMPLANT
ELECT REM PT RETURN 9FT ADLT (ELECTROSURGICAL) ×2
ELECTRODE REM PT RTRN 9FT ADLT (ELECTROSURGICAL) ×1 IMPLANT
GAUZE SPONGE 4X4 12PLY STRL (GAUZE/BANDAGES/DRESSINGS) ×2 IMPLANT
GLOVE BIOGEL PI IND STRL 7.0 (GLOVE) ×3 IMPLANT
GLOVE BIOGEL PI INDICATOR 7.0 (GLOVE) ×3
GLOVE ECLIPSE 7.0 STRL STRAW (GLOVE) ×2 IMPLANT
GOWN STRL REUS W/TWL LRG LVL3 (GOWN DISPOSABLE) ×4 IMPLANT
HEMOSTAT SURGICEL 2X3 (HEMOSTASIS) ×2 IMPLANT
NS IRRIG 1000ML POUR BTL (IV SOLUTION) ×2 IMPLANT
PACK C SECTION WH (CUSTOM PROCEDURE TRAY) ×2 IMPLANT
PAD ABD 7.5X8 STRL (GAUZE/BANDAGES/DRESSINGS) ×2 IMPLANT
PAD ABD 8X7 1/2 STERILE (GAUZE/BANDAGES/DRESSINGS) ×2 IMPLANT
PAD OB MATERNITY 4.3X12.25 (PERSONAL CARE ITEMS) ×2 IMPLANT
PENCIL SMOKE EVAC W/HOLSTER (ELECTROSURGICAL) ×2 IMPLANT
RTRCTR C-SECT PINK 25CM LRG (MISCELLANEOUS) ×2 IMPLANT
SPONGE LAP 18X18 RF (DISPOSABLE) ×2 IMPLANT
SUT PLAIN 2 0 (SUTURE) ×1
SUT PLAIN 2 0 XLH (SUTURE) IMPLANT
SUT PLAIN ABS 2-0 CT1 27XMFL (SUTURE) ×1 IMPLANT
SUT VIC AB 0 CTX 36 (SUTURE) ×4
SUT VIC AB 0 CTX36XBRD ANBCTRL (SUTURE) ×4 IMPLANT
SUT VIC AB 2-0 CT1 (SUTURE) ×2 IMPLANT
SUT VIC AB 4-0 KS 27 (SUTURE) ×2 IMPLANT
TAPE CLOTH SURG 4X10 WHT LF (GAUZE/BANDAGES/DRESSINGS) ×2 IMPLANT
TOWEL OR 17X24 6PK STRL BLUE (TOWEL DISPOSABLE) ×2 IMPLANT

## 2019-01-23 NOTE — Anesthesia Preprocedure Evaluation (Signed)

## 2019-01-23 NOTE — Anesthesia Postprocedure Evaluation (Signed)
Anesthesia Post Note  Patient: Claudia Miller  Procedure(s) Performed: CESAREAN SECTION (N/A Abdomen)     Patient location during evaluation: Mother Baby Anesthesia Type: Epidural Level of consciousness: oriented and awake and alert Pain management: pain level controlled Vital Signs Assessment: post-procedure vital signs reviewed and stable Respiratory status: spontaneous breathing and respiratory function stable Cardiovascular status: blood pressure returned to baseline and stable Postop Assessment: no headache, no backache, no apparent nausea or vomiting and able to ambulate Anesthetic complications: no    Last Vitals:  Vitals:   01/23/19 0926 01/23/19 1020  BP: 111/71 121/63  Pulse: 81 85  Resp: 16 16  Temp: 37.1 C 37.3 C  SpO2: 100% 100%    Last Pain:  Vitals:   01/23/19 1020  TempSrc: Oral  PainSc: 0-No pain   Pain Goal:                   Trevor Iha

## 2019-01-23 NOTE — Anesthesia Postprocedure Evaluation (Signed)
Anesthesia Post Note  Patient: Claudia Miller  Procedure(s) Performed: CESAREAN SECTION (N/A Abdomen)     Patient location during evaluation: Mother Baby Anesthesia Type: Epidural Level of consciousness: awake and alert and oriented Pain management: satisfactory to patient Vital Signs Assessment: post-procedure vital signs reviewed and stable Respiratory status: respiratory function stable and spontaneous breathing Cardiovascular status: blood pressure returned to baseline Postop Assessment: no headache, no backache, spinal receding, patient able to bend at knees and adequate PO intake Anesthetic complications: no    Last Vitals:  Vitals:   01/23/19 1020 01/23/19 1125  BP: 121/63 121/72  Pulse: 85 91  Resp: 16 16  Temp: 37.3 C 37.4 C  SpO2: 100% 98%    Last Pain:  Vitals:   01/23/19 1125  TempSrc: Oral  PainSc: 0-No pain   Pain Goal:                Epidural/Spinal Function Cutaneous sensation: Tingles (01/23/19 1125), Patient able to flex knees: Yes (01/23/19 1125), Patient able to lift hips off bed: Yes (01/23/19 1125), Back pain beyond tenderness at insertion site: No (01/23/19 1125), Progressively worsening motor and/or sensory loss: No (01/23/19 1125), Bowel and/or bladder incontinence post epidural: No (01/23/19 1125)  Yadier Bramhall

## 2019-01-23 NOTE — Transfer of Care (Signed)
Immediate Anesthesia Transfer of Care Note  Patient: Claudia Miller  Procedure(s) Performed: CESAREAN SECTION (N/A Abdomen)  Patient Location: PACU  Anesthesia Type:Epidural  Level of Consciousness: awake, alert  and oriented  Airway & Oxygen Therapy: Patient Spontanous Breathing  Post-op Assessment: Report given to RN and Post -op Vital signs reviewed and stable  Post vital signs: Reviewed and stable  Last Vitals:  Vitals Value Taken Time  BP    Temp    Pulse 100 01/23/2019  8:21 AM  Resp 21 01/23/2019  8:21 AM  SpO2 100 % 01/23/2019  8:21 AM  Vitals shown include unvalidated device data.  Last Pain:  Vitals:   01/23/19 0600  TempSrc:   PainSc: 6          Complications: No apparent anesthesia complications

## 2019-01-23 NOTE — Anesthesia Procedure Notes (Signed)
Epidural Patient location during procedure: OB  Staffing Anesthesiologist: Daundre Biel, MD Performed: anesthesiologist   Preanesthetic Checklist Completed: patient identified, site marked, surgical consent, pre-op evaluation, timeout performed, IV checked, risks and benefits discussed and monitors and equipment checked  Epidural Patient position: sitting Prep: DuraPrep Patient monitoring: heart rate, continuous pulse ox and blood pressure Approach: right paramedian Location: L3-L4 Injection technique: LOR saline  Needle:  Needle type: Tuohy  Needle gauge: 17 G Needle length: 9 cm and 9 Needle insertion depth: 6 cm Catheter type: closed end flexible Catheter size: 20 Guage Catheter at skin depth: 10 cm Test dose: negative  Assessment Events: blood not aspirated, injection not painful, no injection resistance, negative IV test and no paresthesia  Additional Notes Patient identified. Risks/Benefits/Options discussed with patient including but not limited to bleeding, infection, nerve damage, paralysis, failed block, incomplete pain control, headache, blood pressure changes, nausea, vomiting, reactions to medication both or allergic, itching and postpartum back pain. Confirmed with bedside nurse the patient's most recent platelet count. Confirmed with patient that they are not currently taking any anticoagulation, have any bleeding history or any family history of bleeding disorders. Patient expressed understanding and wished to proceed. All questions were answered. Sterile technique was used throughout the entire procedure. Please see nursing notes for vital signs. Test dose was given through epidural needle and negative prior to continuing to dose epidural or start infusion. Warning signs of high block given to the patient including shortness of breath, tingling/numbness in hands, complete motor block, or any concerning symptoms with instructions to call for help. Patient was given  instructions on fall risk and not to get out of bed. All questions and concerns addressed with instructions to call with any issues.     

## 2019-01-23 NOTE — Addendum Note (Signed)
Addendum  created 01/23/19 1216 by Graciela Husbands, CRNA   Clinical Note Signed

## 2019-01-23 NOTE — Op Note (Signed)
Aricia Gleba PROCEDURE DATE: 01/23/2019  PREOPERATIVE DIAGNOSES: Intrauterine pregnancy at [redacted]w[redacted]d weeks gestation; non-reassuring fetal status  POSTOPERATIVE DIAGNOSES: The same  PROCEDURE: Primary Low Transverse Cesarean Section  SURGEON:  Dr. Jaynie Collins  ASSISTANT:  Dr. Rhett Bannister  ANESTHESIOLOGY TEAM: Anesthesiologist: Phillips Grout, MD; Trevor Iha, MD CRNA: Jhonnie Garner, CRNA  INDICATIONS: Claudia Miller is a 28 y.o. [phone] at [redacted]w[redacted]d here for cesarean section secondary to the indications listed under preoperative diagnoses; please see preoperative note for further details.  The risks of cesarean section were discussed with the patient including but were not limited to: bleeding which may require transfusion or reoperation; infection which may require antibiotics; injury to bowel, bladder, ureters or other surrounding organs; injury to the fetus; need for additional procedures including hysterectomy in the event of a life-threatening hemorrhage; placental abnormalities wth subsequent pregnancies, incisional problems, thromboembolic phenomenon and other postoperative/anesthesia complications.   The patient concurred with the proposed plan, giving informed written consent for the procedure.    FINDINGS:  Viable female infant in cephalic presentation.  Apgars 9 and 9.  Amniotic fluid noted to have meconium.  Intact placenta, three vessel cord.  Normal uterus, fallopian tubes and ovaries bilaterally. Injury to left uterine vessel noted, controlled with an O'Leary stitch.  ANESTHESIA: Spinal INTRAVENOUS FLUIDS: 2800 ml   ESTIMATED BLOOD LOSS: 529 ml as per PACU URINE OUTPUT: 125 ml SPECIMENS: Placenta sent to pathology COMPLICATIONS: None immediate  PROCEDURE IN DETAIL:  The patient preoperatively received intravenous antibiotics and had sequential compression devices applied to her lower extremities.  She was then taken to the operating room where the epidural  anesthesia was dosed up to surgical level and was found to be adequate. She was then placed in a dorsal supine position with a leftward tilt, and prepped and draped in a sterile manner.  A foley catheter was placed into her bladder and attached to constant gravity.  After an adequate timeout was performed, a Pfannenstiel skin incision was made with scalpel and carried through to the underlying layer of fascia. The fascia was incised in the midline, and this incision was extended bilaterally using the Mayo scissors.  Kocher clamps were applied to the superior aspect of the fascial incision and the underlying rectus muscles were dissected off bluntly and sharply.  A similar process was carried out on the inferior aspect of the fascial incision. The rectus muscles were separated in the midline and the peritoneum was entered bluntly. The Alexis self-retaining retractor was introduced into the abdominal cavity.  Attention was turned to the lower uterine segment where a low transverse hysterotomy was made with a scalpel and extended bilaterally bluntly.  The infant was successfully delivered, the cord was clamped and cut after one minute, and the infant was handed over to the awaiting neonatology team. Uterine massage was then administered, and the placenta delivered intact with a three-vessel cord. The uterus was then cleared of clots and debris.  The hysterotomy was closed with 0 Vicryl in a running locked fashion, and an imbricating layer was also placed with 0 Vicryl.  There was injury to the left uterine vessels, an O'Leary stitch was placed and it helped with hemostasis.  Surgicel was placed over this area after the stitch.   The pelvis was cleared of all clot and debris. Hemostasis was confirmed on all surfaces.  The retractor was removed.  The peritoneum and the rectus muscles were reapproximated using a 2-0 Vicryl interrupted stitch. The fascia was then closed  using 0 Vicryl in a running fashion.  The  subcutaneous layer was irrigated, reapproximated with 2-0 plain gut running stitch, and the skin was closed with a 4-0 Vicryl subcuticular stitch. The patient tolerated the procedure well. Sponge, instrument and needle counts were correct x 3.  She was taken to the recovery room in stable condition.    Jaynie Collins, MD, FACOG Obstetrician & Gynecologist, [facility] [employer], [facility]

## 2019-01-23 NOTE — Progress Notes (Signed)
OB/GYN Faculty Practice: Labor Progress Note  Subjective: Into room because recurrent deep variable decelerations. CNM recently placed IUPC, started amnioinfusion and FSE. Multiple position changes tried without much improvement. Bolus running.   Objective: BP (!) 112/58   Pulse 99   Temp 98.6 F (37 C) (Oral)   Resp 18   Ht 5\' 1"  (1.549 m)   Wt 88 kg   LMP 04/04/2018   SpO2 100%   BMI 36.66 kg/m  Gen: tearful, still uncomfortable with contractions Dilation: 6.5 Effacement (%): 70 Cervical Position: (Maternal left) Station: -2 Presentation: Vertex Exam by:: Dr. Marlis Edelson  Assessment and Plan: 28 y.o. O2V0350 [redacted]w[redacted]d here with PROM moderate meconium.  Labor: Transitioning into active labor. Interval change since starting pitocin. Given recurrent deep variables, pitocin stopped and terbutaline administered. Contractions appropriately spaced but still with small variables. -- IUPC, amnioinfusion running -- pain control: epidural now in place -- PPH Risk: low  Fetal Well-Being: EFW 6-7lbs. Cephalic by sutures.  -- Category II - continuous fetal monitoring - multiple interventions including position changes, oxygen, bolus, terbutaline x1 -- GBS negative    Claudia Radloff S. Earlene Plater, DO OB/GYN Fellow, Faculty Practice  3:59 AM

## 2019-01-23 NOTE — Progress Notes (Addendum)
Recurrent late decelerations. Discussed need to proceed with Cesarean section with patient. Will give additional dose of terbutaline.   Dr. Macon Large in the room to consent patient for Cesarean section.   The risks of cesarean section discussed with the patient included but were not limited to: bleeding which may require transfusion or reoperation; infection which may require antibiotics; injury to bowel, bladder, ureters or other surrounding organs; injury to the fetus; need for additional procedures including hysterectomy in the event of a life-threatening hemorrhage; placental abnormalities wth subsequent pregnancies, incisional problems, thromboembolic phenomenon and other postoperative/anesthesia complications. The patient concurred with the proposed plan, giving informed written consent for the procedure.   Patient has been NPO since before 10pm, and she will remain NPO for procedure. Anesthesia and OR aware. Preoperative prophylactic antibiotics and SCDs ordered on call to the OR.  To OR when ready.  Cristal Deer. Earlene Plater, DO OB/GYN Fellow   Attestation of Attending Supervision of Obstetric Fellow: Evaluation and management procedures were performed by the Obstetric Fellow under my supervision and collaboration.  I have reviewed the Obstetric Fellow's note and chart, and I agree with the management and plan.  Patient counsleled about need for cesarean delivery at this point for fetal indications, to OR when ready.   Jaynie Collins, MD, FACOG Attending Obstetrician & Gynecologist Faculty Practice, [facility]

## 2019-01-24 ENCOUNTER — Encounter (HOSPITAL_COMMUNITY): Payer: Self-pay | Admitting: Obstetrics & Gynecology

## 2019-01-24 DIAGNOSIS — D62 Acute posthemorrhagic anemia: Secondary | ICD-10-CM | POA: Diagnosis not present

## 2019-01-24 LAB — CBC
HCT: 22.8 % — ABNORMAL LOW (ref 36.0–46.0)
Hemoglobin: 7.7 g/dL — ABNORMAL LOW (ref 12.0–15.0)
MCH: 29.8 pg (ref 26.0–34.0)
MCHC: 33.8 g/dL (ref 30.0–36.0)
MCV: 88.4 fL (ref 80.0–100.0)
Platelets: 128 10*3/uL — ABNORMAL LOW (ref 150–400)
RBC: 2.58 MIL/uL — ABNORMAL LOW (ref 3.87–5.11)
RDW: 13.8 % (ref 11.5–15.5)
WBC: 8.8 10*3/uL (ref 4.0–10.5)
nRBC: 0 % (ref 0.0–0.2)

## 2019-01-24 LAB — CREATININE, SERUM
Creatinine, Ser: 0.45 mg/dL (ref 0.44–1.00)
GFR calc Af Amer: >60 mL/min (ref >60)
GFR calc non Af Amer: >60 mL/min (ref >60)

## 2019-01-24 MED ORDER — SODIUM CHLORIDE 0.9 % IV SOLN
510.0000 mg | Freq: Once | INTRAVENOUS | Status: AC
  Administered 2019-01-24: 510 mg via INTRAVENOUS
  Filled 2019-01-24: qty 17

## 2019-01-24 MED ORDER — IBUPROFEN 800 MG PO TABS
800.0000 mg | ORAL_TABLET | Freq: Three times a day (TID) | ORAL | Status: DC
  Administered 2019-01-24 – 2019-01-25 (×3): 800 mg via ORAL
  Filled 2019-01-24 (×3): qty 1

## 2019-01-24 NOTE — Progress Notes (Signed)
Subjective: Postpartum Day 1: Cesarean Delivery Patient reports tolerating PO and no problems voiding.    Objective: Vital signs in last 24 hours: Temp:  [97.9 F (36.6 C)-99.4 F (37.4 C)] 97.9 F (36.6 C) (01/29 0530) Pulse Rate:  [68-98] 77 (01/29 0530) Resp:  [16-25] 16 (01/29 0530) BP: (103-129)/(46-72) 105/51 (01/29 0530) SpO2:  [98 %-100 %] 100 % (01/29 0530)  Physical Exam:  General: alert, cooperative and no distress Lochia: appropriate Uterine Fundus: firm Incision: no significant drainage, no significant erythema DVT Evaluation: No evidence of DVT seen on physical exam. Negative Homan's sign. No cords or calf tenderness. No significant calf/ankle edema.  Recent Labs    01/22/19 2232 01/24/19 0637  HGB 11.6* 7.7*  HCT 35.6* 22.8*    Assessment/Plan: Status post Cesarean section. Doing well postoperatively.  Continue current care. Anticipate D/c tomorrow feraheme injection  Claudia Miller [facility] 01/24/2019, 7:22 AM

## 2019-01-25 MED ORDER — PRENATAL MULTIVITAMIN CH: 1.0000 | ORAL_TABLET | Freq: Every day | ORAL | 0 refills | Status: AC

## 2019-01-25 MED ORDER — IBUPROFEN 800 MG PO TABS: 800.0000 mg | ORAL_TABLET | Freq: Three times a day (TID) | ORAL | 0 refills | Status: DC

## 2019-01-25 MED ORDER — FERROUS SULFATE 325 (65 FE) MG PO TABS: 325.0000 mg | ORAL_TABLET | ORAL | 0 refills | Status: DC

## 2019-01-25 NOTE — Discharge Summary (Addendum)
Postpartum Discharge Summary     Patient Name: Claudia Miller DOB: 02-07-1991 MRN: [id number]  Date of admission: 01/22/2019 Delivering Provider: Jaynie Collins A   Date of discharge: 01/25/2019  Admitting diagnosis: 39WKS,LEAKING FLUIDS, CTX Intrauterine pregnancy: [redacted]w[redacted]d     Secondary diagnosis:  Principal Problem:   S/P cesarean section Active Problems:   Trichomoniasis   PROM (premature rupture of membranes)   Acute blood loss as cause of postoperative anemia    Discharge diagnosis: Term Pregnancy Delivered                                                                                      Post partum procedures:n/a  Augmentation: n/a  Complications: Non-reassuring fetal heart tracing  Hospital course:  Onset of Labor With Unplanned C/S  28 y.o. yo G3P3003 at [redacted]w[redacted]d was admitted in Latent Labor on 01/22/2019. Patient had a labor course significant for NRFHT. Membrane Rupture Time/Date: 9:30 PM ,01/22/2019   The patient went for cesarean section due to Non-Reassuring FHR, and delivered a Viable infant,01/23/2019  Details of operation can be found in separate operative note. Patient had an uncomplicated postpartum course.  [name],tolerating a regular diet, passing flatus, and urinating well.  Patient is discharged home in stable condition 01/25/19.  Magnesium Sulfate recieved: No BMZ received: No  Physical exam  Vitals:   01/24/19 1100 01/24/19 1412 01/24/19 2321 01/25/19 0529  BP: (!) 99/51 117/71 (!) 114/57 (!) 110/57  Pulse: 87 100 82 78  Resp: (!) 26 20 18 16   Temp: 98.8 F (37.1 C)  97.7 F (36.5 C) 98.3 F (36.8 C)  TempSrc: Oral  Oral Oral  SpO2: 99%     Weight:      Height:       General: alert, cooperative and no distress Lochia: appropriate Uterine Fundus: firm Incision: healing well, minimal serous drainage  DVT Evaluation: No evidence of DVT seen on physical exam. Labs: Lab Results  Component Value Date   WBC 8.8 01/24/2019   HGB 7.7  (L) 01/24/2019   HCT 22.8 (L) 01/24/2019   MCV 88.4 01/24/2019   PLT 128 (L) 01/24/2019   CMP Latest Ref Rng & Units 01/24/2019  Glucose 70 - 99 mg/dL -  BUN 6 - 20 mg/dL -  Creatinine 1.19 - 4.17 mg/dL 4.08  Sodium 144 - 818 mmol/L -  Potassium 3.5 - 5.1 mmol/L -  Chloride 98 - 111 mmol/L -  CO2 22 - 32 mmol/L -  Calcium 8.9 - 10.3 mg/dL -  Total Protein 6.5 - 8.1 g/dL -  Total Bilirubin 0.3 - 1.2 mg/dL -  Alkaline Phos 38 - 563 U/L -  AST 15 - 41 U/L -  ALT 0 - 44 U/L -    Discharge instruction: per After Visit Summary and "Baby and Me Booklet".  After visit meds:  Allergies as of 01/25/2019      Reactions   Fish Allergy Anaphylaxis      Medication List    TAKE these medications   ferrous sulfate 325 (65 FE) MG tablet Take 1 tablet (325 mg total) by mouth every other day for 30 days.  ibuprofen 800 MG tablet Commonly known as:  ADVIL,MOTRIN Take 1 tablet (800 mg total) by mouth every 8 (eight) hours.   prenatal multivitamin Tabs tablet Take 1 tablet by mouth daily at 12 noon for 30 days.       Diet: routine diet  Activity: Advance as tolerated. Pelvic rest for 6 weeks.   Outpatient follow up:4 weeks Follow up Appt: Future Appointments  Date Time Provider Department Center  02/06/2019 10:00 AM Gerrit Heck, CNM [facility] None  02/20/2019  2:15 PM Constant, Gigi Gin, MD [facility] None   Follow up Visit:   Please schedule this patient for Postpartum visit in: 4 weeks with the following provider: Any provider For C/S patients schedule nurse incision check in weeks 2 weeks: yes Low risk pregnancy complicated by: n/a Delivery mode:  CS Anticipated Birth Control:  Depo PP Procedures needed: Incision check  Schedule Integrated BH visit: no      Newborn Data: Live born female  Birth Weight: 6 lb 4.4 oz (2845 g) APGAR: 9, 9  Newborn Delivery   Birth date/time:  01/23/2019 07:17:08 Delivery type:  C-Section, Low Transverse Trial of labor:  Yes C-section  categorization:  Primary     Baby Feeding: Bottle Disposition:home with mother   01/25/2019 Cristine Polio, MD    OB FELLOW DISCHARGE ATTESTATION  I have seen and examined this patient and agree with above documentation in the resident's note.   Marcy Siren, D.O. OB Fellow  01/25/2019, 9:40 PM

## 2019-02-06 ENCOUNTER — Ambulatory Visit (INDEPENDENT_AMBULATORY_CARE_PROVIDER_SITE_OTHER): Payer: Medicaid Other

## 2019-02-06 VITALS — BP 105/70 | HR 77 | Temp 99.0°F | Wt 178.0 lb

## 2019-02-06 DIAGNOSIS — Z30013 Encounter for initial prescription of injectable contraceptive: Secondary | ICD-10-CM

## 2019-02-06 DIAGNOSIS — Z3202 Encounter for pregnancy test, result negative: Secondary | ICD-10-CM

## 2019-02-06 DIAGNOSIS — K5900 Constipation, unspecified: Secondary | ICD-10-CM

## 2019-02-06 DIAGNOSIS — Z4889 Encounter for other specified surgical aftercare: Secondary | ICD-10-CM

## 2019-02-06 DIAGNOSIS — Z9889 Other specified postprocedural states: Secondary | ICD-10-CM

## 2019-02-06 LAB — POCT URINE PREGNANCY: Preg Test, Ur: NEGATIVE

## 2019-02-06 MED ORDER — MEDROXYPROGESTERONE ACETATE 150 MG/ML IM SUSP
150.0000 mg | Freq: Once | INTRAMUSCULAR | Status: AC
  Administered 2019-02-06: 150 mg via INTRAMUSCULAR

## 2019-02-06 MED ORDER — DOCUSATE SODIUM 100 MG PO CAPS: 100.0000 mg | ORAL_CAPSULE | Freq: Two times a day (BID) | ORAL | 1 refills | Status: DC

## 2019-02-06 MED ORDER — ACETAMINOPHEN 500 MG PO TABS: 500.0000 mg | ORAL_TABLET | Freq: Four times a day (QID) | ORAL | 0 refills | Status: DC | PRN

## 2019-02-06 NOTE — Progress Notes (Signed)
2 week pp here for incision check. C-section on 01/23/19  CC: Patient states she wants to discuss pain management IBP 800 mg not working.  LMP: pt states since leaving hospital she has had her "period". Pt states incision area its warm to touch, tender and pt notes a "knot"/ "Lumpy". Denies any drainage or odor from area.

## 2019-02-07 NOTE — Progress Notes (Signed)
  Subjective:     Patient ID: Claudia Miller, female   DOB: [birth year], 28 y.o.   MRN: [id number]  Patient presents for incision check and c/o abdominal pain and "knot" near incision.  Patient s/p Primary C/S on 01/23/2019 that was uncomplicated.  Patient reports bowel movements that are hard to pass reporting stool this morning with difficulty.  Patient states she is taking ibuprofen for pain with minimal improvement.  Patient reports that she washes her incision by "dabbing" it with a towel and noticed the note on the left side.  Patient denies HA, visual disturbances, SOB, or other pain.  Patient reports desire for Depo Provera for [facility], but did not receive prior to hospital discharge.     Review of Systems  Constitutional: Negative for chills and fatigue.  Respiratory: Negative for shortness of breath.   Gastrointestinal: Positive for abdominal pain and constipation. Negative for nausea and vomiting. Anal bleeding: At incision.  Genitourinary: Positive for vaginal bleeding.  Neurological: Negative for dizziness, light-headedness and headaches.       Objective:   Physical Exam Constitutional:      Appearance: Normal appearance.  HENT:     Head: Normocephalic and atraumatic.     Mouth/Throat:     Mouth: Mucous membranes are moist.  Eyes:     Conjunctiva/sclera: Conjunctivae normal.  Neck:     Musculoskeletal: Normal range of motion.  Cardiovascular:     Rate and Rhythm: Normal rate and regular rhythm.     Heart sounds: Normal heart sounds.  Pulmonary:     Effort: Pulmonary effort is normal.     Breath sounds: Normal breath sounds.  Abdominal:     General: Bowel sounds are normal.     Palpations: Abdomen is soft.     Tenderness: There is abdominal tenderness (Mild at incision area; appropriate).     Comments: Incision: Lower abdominal, Intact, well approximated and healing well. No drainage or erythema.  Mild tenderness to touch.   Skin:    General: Skin is warm and dry.   Neurological:     Mental Status: She is alert.        Assessment:     Incision Check Constipation Pain  Depo Provera    Plan:     -Educated on incisional care including cleaning, no scrubbing, and keeping area dry. -Informed that "knot" felt is suture and will dissolve over time. -Instructed to take ibuprofen and acetaminophen ATC for pain management. -Requested and script sent for Acetaminophen 500mg   -Discussed increasing water and fiber in diet to decrease constipation. -Script sent for Colace 100mg  BID -Pregnancy test negative -Given Depo Provera 150mg  IM today -Bleeding precautions -Plan to RTC, as scheduled, for PPV   MARCIANA MCGLASHAN, CNM 02/06/2019

## 2019-02-20 ENCOUNTER — Ambulatory Visit (INDEPENDENT_AMBULATORY_CARE_PROVIDER_SITE_OTHER): Payer: Medicaid Other | Admitting: Obstetrics and Gynecology

## 2019-02-20 ENCOUNTER — Other Ambulatory Visit: Payer: Self-pay

## 2019-02-20 ENCOUNTER — Inpatient Hospital Stay (HOSPITAL_COMMUNITY): Payer: Medicaid Other

## 2019-02-20 ENCOUNTER — Inpatient Hospital Stay (HOSPITAL_COMMUNITY)
Admission: AD | Admit: 2019-02-20 | Discharge: 2019-02-20 | Disposition: A | Discharge status: Home / Self Care | Payer: Medicaid Other | Attending: Obstetrics & Gynecology | Admitting: Obstetrics & Gynecology

## 2019-02-20 ENCOUNTER — Encounter (HOSPITAL_COMMUNITY): Payer: Self-pay

## 2019-02-20 ENCOUNTER — Encounter: Payer: Self-pay | Admitting: Obstetrics and Gynecology

## 2019-02-20 DIAGNOSIS — L7682 Other postprocedural complications of skin and subcutaneous tissue: Secondary | ICD-10-CM | POA: Diagnosis not present

## 2019-02-20 DIAGNOSIS — Z98891 History of uterine scar from previous surgery: Secondary | ICD-10-CM

## 2019-02-20 DIAGNOSIS — L03311 Cellulitis of abdominal wall: Secondary | ICD-10-CM

## 2019-02-20 DIAGNOSIS — G8918 Other acute postprocedural pain: Secondary | ICD-10-CM | POA: Diagnosis not present

## 2019-02-20 DIAGNOSIS — Z1389 Encounter for screening for other disorder: Secondary | ICD-10-CM | POA: Diagnosis not present

## 2019-02-20 DIAGNOSIS — O9089 Other complications of the puerperium, not elsewhere classified: Secondary | ICD-10-CM | POA: Diagnosis not present

## 2019-02-20 DIAGNOSIS — R935 Abnormal findings on diagnostic imaging of other abdominal regions, including retroperitoneum: Secondary | ICD-10-CM | POA: Diagnosis not present

## 2019-02-20 LAB — CBC WITH DIFFERENTIAL/PLATELET
Abs Immature Granulocytes: 0.01 10*3/uL (ref 0.00–0.07)
Basophils Absolute: 0 10*3/uL (ref 0.0–0.1)
Basophils Relative: 0 %
Eosinophils Absolute: 0.1 10*3/uL (ref 0.0–0.5)
Eosinophils Relative: 2 %
HCT: 40.4 % (ref 36.0–46.0)
Hemoglobin: 12.7 g/dL (ref 12.0–15.0)
IMMATURE GRANULOCYTES: 0 %
Lymphocytes Relative: 34 %
Lymphs Abs: 1.8 10*3/uL (ref 0.7–4.0)
MCH: 28.5 pg (ref 26.0–34.0)
MCHC: 31.4 g/dL (ref 30.0–36.0)
MCV: 90.8 fL (ref 80.0–100.0)
Monocytes Absolute: 0.3 10*3/uL (ref 0.1–1.0)
Monocytes Relative: 6 %
Neutro Abs: 3 10*3/uL (ref 1.7–7.7)
Neutrophils Relative %: 58 %
Platelets: 254 10*3/uL (ref 150–400)
RBC: 4.45 MIL/uL (ref 3.87–5.11)
RDW: 13.2 % (ref 11.5–15.5)
WBC: 5.2 10*3/uL (ref 4.0–10.5)
nRBC: 0 % (ref 0.0–0.2)

## 2019-02-20 LAB — URINALYSIS, ROUTINE W REFLEX MICROSCOPIC
Bilirubin Urine: NEGATIVE
Glucose, UA: NEGATIVE mg/dL
Ketones, ur: NEGATIVE mg/dL
Nitrite: NEGATIVE
PH: 7 (ref 5.0–8.0)
Protein, ur: NEGATIVE mg/dL
Specific Gravity, Urine: 1.019 (ref 1.005–1.030)

## 2019-02-20 LAB — COMPREHENSIVE METABOLIC PANEL
ALK PHOS: 61 U/L (ref 38–126)
ALT: 16 U/L (ref 0–44)
AST: 16 U/L (ref 15–41)
Albumin: 3.6 g/dL (ref 3.5–5.0)
Anion gap: 9 (ref 5–15)
BUN: 7 mg/dL (ref 6–20)
CO2: 23 mmol/L (ref 22–32)
Calcium: 8.8 mg/dL — ABNORMAL LOW (ref 8.9–10.3)
Chloride: 106 mmol/L (ref 98–111)
Creatinine, Ser: 0.68 mg/dL (ref 0.44–1.00)
GFR calc Af Amer: >60 mL/min (ref >60)
GFR calc non Af Amer: >60 mL/min (ref >60)
Glucose, Bld: 75 mg/dL (ref 70–99)
Potassium: 3.5 mmol/L (ref 3.5–5.1)
Sodium: 138 mmol/L (ref 135–145)
Total Bilirubin: 0.1 mg/dL — ABNORMAL LOW (ref 0.3–1.2)
Total Protein: 7.2 g/dL (ref 6.5–8.1)

## 2019-02-20 MED ORDER — SULFAMETHOXAZOLE-TRIMETHOPRIM 800-160 MG PO TABS: 1.0000 | ORAL_TABLET | Freq: Two times a day (BID) | ORAL | 0 refills | Status: DC

## 2019-02-20 MED ORDER — IBUPROFEN 800 MG PO TABS
800.0000 mg | ORAL_TABLET | Freq: Once | ORAL | Status: AC
  Administered 2019-02-20: 800 mg via ORAL
  Filled 2019-02-20: qty 1

## 2019-02-20 MED ORDER — MEDROXYPROGESTERONE ACETATE 150 MG/ML IM SUSP: 150.0000 mg | INTRAMUSCULAR | 5 refills | Status: DC

## 2019-02-20 MED ORDER — IOHEXOL 300 MG/ML  SOLN
100.0000 mL | Freq: Once | INTRAMUSCULAR | Status: AC | PRN
  Administered 2019-02-20: 100 mL via INTRAVENOUS

## 2019-02-20 NOTE — Discharge Instructions (Signed)
·   Take antibiotics as directed.   Keep incision dry and covered with gauze.  If worsening discharge, pain, redness or fever, please return to MAU.

## 2019-02-20 NOTE — MAU Provider Note (Signed)
History    CSN: [id number] Arrival date and time: 02/20/19 1458 First Provider Initiated Contact with Patient 02/20/19 1659    Chief Complaint  Patient presents with  . Post-op Problem   HPI  Claudia Miller is a 27yo [id number] who is about 4-weeks postpartum from a primary LTCS for fetal intolerance of labor. Had postpartum visit today and there was concern for possible incision infection. Patient states area has been tender for last couple of weeks. Had incision check visit about 2 weeks ago and felt a knot. Was told likely related to suture that would eventually come out. States started to notice fluid leaking from incision about two days ago. Has had some chills but feels like these are "normal". Denies any fevers, N/V/D. Pain mostly on top of incision, no RLQ/LLQ pain. Denies pain with urination. Still uncomfortable to go from lying down to sitting and sitting to standing.   OB History    Gravida  3   Para  3   Term  3   Preterm      AB      Living  3     SAB      TAB      Ectopic      Multiple  0   Live Births  3           History reviewed. No pertinent past medical history.  Past Surgical History:  Procedure Laterality Date  . CESAREAN SECTION N/A 01/23/2019   Procedure: CESAREAN SECTION;  Surgeon: Tereso Newcomer, MD;  Location: [facility];  Service: Obstetrics;  Laterality: N/A;  . NO PAST SURGERIES      Family History  Problem Relation Age of Onset  . Asthma Mother   . Diabetes Mother   . Hypertension Mother   . Cancer Father   . Hypertension Father   . Asthma Sister   . Hypertension Sister   . Asthma Brother   . Hypertension Brother     Social History   Tobacco Use  . Smoking status: Former Smoker    Packs/day: 0.00    Types: Cigarettes    Last attempt to quit: 05/31/2018    Years since quitting: 0.7  . Smokeless tobacco: Never Used  Substance Use Topics  . Alcohol use: No  . Drug use: No    Allergies:  Allergies  Allergen  Reactions  . Fish Allergy Anaphylaxis    Medications Prior to Admission  Medication Sig Dispense Refill Last Dose  . acetaminophen (TYLENOL) 500 MG tablet Take 1 tablet (500 mg total) by mouth every 6 (six) hours as needed. 30 tablet 0   . docusate sodium (COLACE) 100 MG capsule Take 1 capsule (100 mg total) by mouth 2 (two) times daily. 20 capsule 1   . ferrous sulfate 325 (65 FE) MG tablet Take 1 tablet (325 mg total) by mouth every other day for 30 days. (Patient not taking: Reported on 02/06/2019) 15 tablet 0 Not Taking  . ibuprofen (ADVIL,MOTRIN) 800 MG tablet Take 1 tablet (800 mg total) by mouth every 8 (eight) hours. 30 tablet 0 Taking  . medroxyPROGESTERone (DEPO-PROVERA) 150 MG/ML injection Inject 1 mL (150 mg total) into the muscle every 3 (three) months. 1 mL 5   . Prenatal Vit-Fe Fumarate-FA (PRENATAL MULTIVITAMIN) TABS tablet Take 1 tablet by mouth daily at 12 noon for 30 days. (Patient not taking: Reported on 02/06/2019) 30 tablet 0 Not Taking    Review of Systems  Constitutional: Positive for chills  and fatigue. Negative for activity change, appetite change and fever.  Gastrointestinal: Positive for abdominal pain. Negative for diarrhea, nausea and vomiting.  Genitourinary: Positive for vaginal bleeding (minimal). Negative for dysuria and vaginal discharge.  Musculoskeletal: Negative for back pain and myalgias.  Skin: Negative for rash.  Neurological: Negative for dizziness and light-headedness.  Psychiatric/Behavioral: Positive for sleep disturbance.   Physical Exam   Blood pressure (!) 117/52, pulse 72, temperature 98.5 F (36.9 C), temperature source Oral, resp. rate 16, weight 84.6 kg, SpO2 100 %, not currently breastfeeding.  Physical Exam  Nursing note and vitals reviewed. Constitutional: She is oriented to person, place, and time. She appears well-developed and well-nourished. No distress.  HENT:  Head: Normocephalic and atraumatic.  Eyes: Conjunctivae and EOM  are normal.  Cardiovascular: Normal rate.  Respiratory: Effort normal. No respiratory distress.  GI: Soft.  TTP just superior to the incision  right side of incision with pinpoint opening in skin (unable to tolerate probing) and then right 2-3 cm of incision with some yellow serous discharge, unable to probe through into subcuticular space  no more tenderness over mons  Musculoskeletal:        General: No edema.  Neurological: She is alert and oriented to person, place, and time.  Psychiatric: She has a normal mood and affect. Her behavior is normal.   MAU Course  Procedures  MDM -- discussed patient with Dr. Jolayne Panther who sent patient over from clinic for evaluation - at that time was having chills/abdominal pain  right purulent drainage, tenderness to palpation over mons pubis - discussed plan to order CT scan, CBC, CMP and possible need for debridement depending on findings of CT scan --  -- CMP wnl, CBC wnl - no signs of infection   CT ABDOMEN PELVIS W CONTRAST CLINICAL DATA:  28 year old female with a history of redness at C-section site  EXAM: CT ABDOMEN AND PELVIS WITH CONTRAST  TECHNIQUE: Multidetector CT imaging of the abdomen and pelvis was performed using the standard protocol following bolus administration of intravenous contrast.  CONTRAST:  OMNIPAQUE IOHEXOL 300 MG/ML  SOLN  COMPARISON:  12/10/2015  FINDINGS: Lower chest: No acute abnormality.  Hepatobiliary: Unremarkable liver.  Unremarkable gallbladder  Pancreas: Unremarkable pancreas  Spleen: Unremarkable  Adrenals/Urinary Tract: Unremarkable appearance of the adrenal glands. No evidence of hydronephrosis of the right or left kidney. No nephrolithiasis. Unremarkable course of the bilateral ureters. Unremarkable appearance of the urinary bladder.  Stomach/Bowel: Unremarkable stomach. Unremarkable small bowel. Normal appendix. No significant stool burden. No evidence  of obstruction.  Vascular/Lymphatic: Unremarkable vasculature.  No adenopathy.  Reproductive: No significant enlargement of the uterus. No significant intrauterine fluid. Unremarkable adnexa.  Minimal stranding of the anterior pelvic wall soft tissues with no focal fluid or significant inflammation.  Other: No abdominal hernia. Unremarkable appearance of the anterior abdominal wall musculature. No inguinal adenopathy.  Musculoskeletal: No acute fracture.  IMPRESSION: No acute CT finding.  Minimal stranding of the anterior pelvic wall soft tissues in this patient with recent surgery. No evidence of focal fluid.  Electronically Signed   By: Gilmer Mor D.O.   On: 02/20/2019 19:28   Assessment and Plan  Claudia Miller is a 27yo [id number] who is about 4 weeks postpartum from pLTCS for fetal intolerance of labor. Operation was uncomplicated, routine postpartum course until noticed discharge and increased incisional pain in last couple of days. Evaluated for incisional/pelvic abscess but no evidence of CT scan. CBC with normal white count, CMP unremarkable. Unable  to probe beneath skin into subcuticular layer with cotton swab so low suspicion for tracking. Given slight purulence to discharge, will cover for MRSA and treat with 7 days of Bactrim DS. Encouraged patient to follow-up in clinic in 1 week for additional incision check, reviewed return precautions. Encouraged patient to continue ibuprofen for pain, she declined any opioid pain medications. All questions answered, patient discharged home in stable condition.   Tamera Stands, DO 02/20/2019, 4:59 PM

## 2019-02-20 NOTE — Progress Notes (Signed)
Post Partum Exam  Claudia Miller is a 28 y.o. [id number] female who presents for a postpartum visit. She is 4 weeks postpartum following a low cervical transverse Cesarean section. I have fully reviewed the prenatal and intrapartum course. The delivery was at 39 gestational weeks.  Anesthesia: spinal. Postpartum course has been doing well. Baby's course has been doing well . Baby is feeding by bottle [name]. Bleeding thin lochia. Bowel function is normal. Bladder function is normal. Patient is not sexually active. Contraception method is abstinence and Depo-Provera injections. Patient reports persistent incisional pain with onset of drainage 2 days ago. She reports chills at home.  Postpartum depression screening:neg, score 0.    Last pap smear done 06/2018 and was Normal  Review of Systems Pertinent items are noted in HPI.    Objective:  Blood pressure 111/73, pulse 79, weight 185 lb (83.9 kg), not currently breastfeeding.  General:  alert, cooperative and no distress   Breasts:  inspection negative, no nipple discharge or bleeding, no masses or nodularity palpable  Lungs: clear to auscultation bilaterally  Heart:  regular rate and rhythm  Abdomen: soft, non-tender; bowel sounds normal; no masses,  no organomegaly and incision: purulent drainage noted on right aspect of the incision with srrounding tenderness. 2 small skin defects one measuring 5 mlm and the other 2 mm.   Vulva:  normal  Vagina: normal vagina, no discharge, exudate, lesion, or erythema  Cervix:  multiparous appearance  Corpus: normal size, contour, position, consistency, mobility, non-tender  Adnexa:  normal adnexa and no mass, fullness, tenderness  Rectal Exam: Not performed.        Assessment:    normal postpartum exam. Pap smear not done at today's visit.   Plan:   1. Contraception: Depo-Provera injections 2. Patient sent to maternity admissions for the evaluation of wound infection with CT and possible wound  exploration with anesthesia 3. Follow up in: 6 months for annual exam or as needed.

## 2019-02-20 NOTE — MAU Note (Signed)
Went to the dr today,  They checked her incision, thought she might have an infection and sent her over for a CT scan.  C/s on 1/28.

## 2019-03-31 IMAGING — CT CT ABD-PELV W/ CM
2 of 4 series · 17 of 46 positions shown, 19 images · IV contrast (omnipaque)
Comparison: 12/10/2015

CLINICAL DATA: 27-year-old female with a history of redness at
C-section site

EXAM:
CT ABDOMEN AND PELVIS WITH CONTRAST
TECHNIQUE: Multidetector CT imaging of the abdomen and pelvis was performed
using the standard protocol following bolus administration of
intravenous contrast.
CONTRAST:  100mL OMNIPAQUE IOHEXOL 300 MG/ML  SOLN

[Series 3: a/p w/ 5mm · axial · 0.93mm/px · z∈[-374,+31]mm · 14 of 89 slices shown, 16 images]
[im 4/89  soft-tissue]
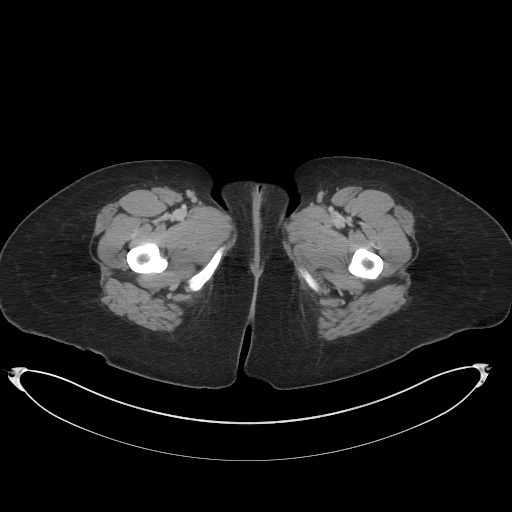
[im 4/89  bone]
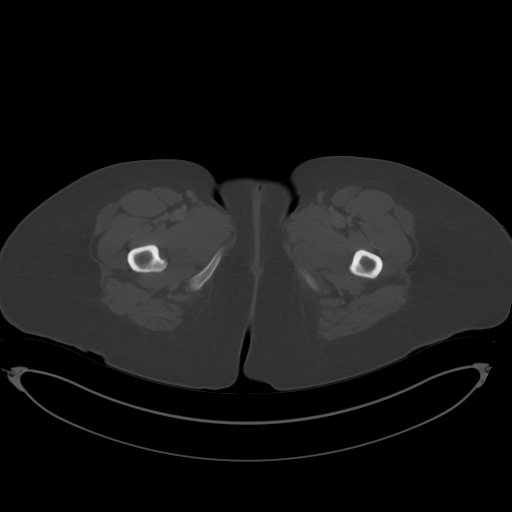
[im 12/89  soft-tissue]
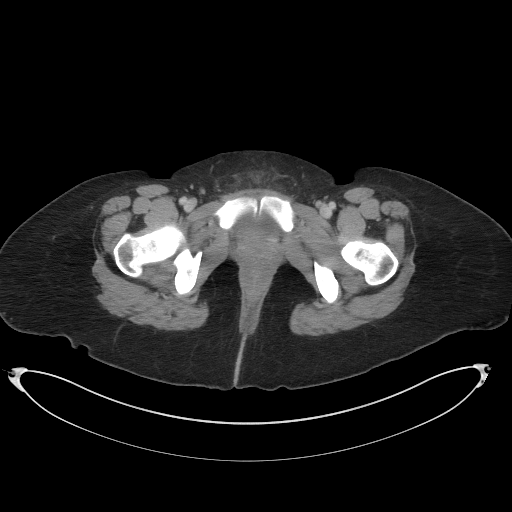
[im 19/89  soft-tissue]
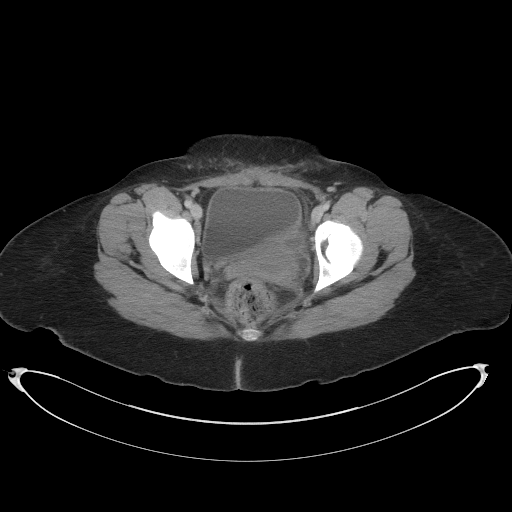
[im 23/89  soft-tissue]
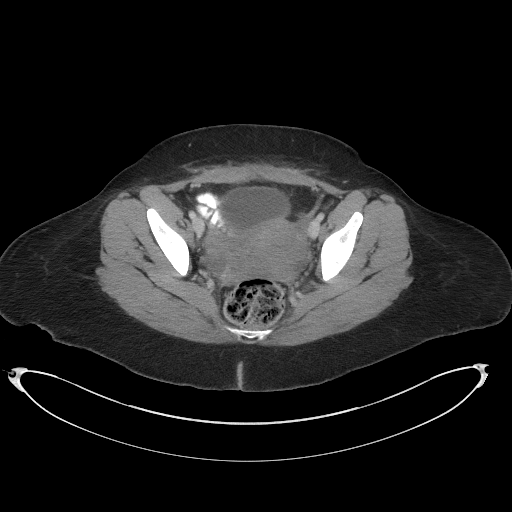
[im 30/89  soft-tissue]
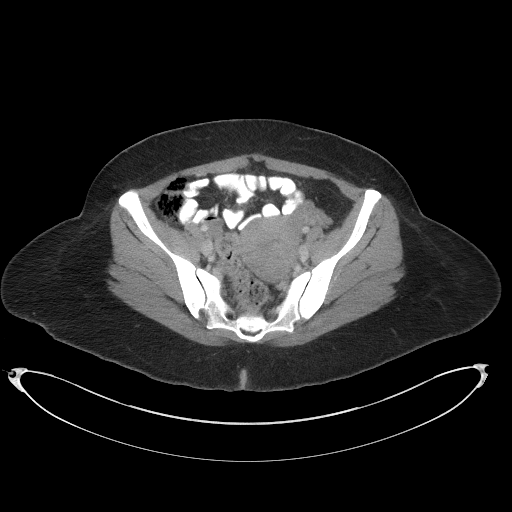
[im 37/89  soft-tissue]
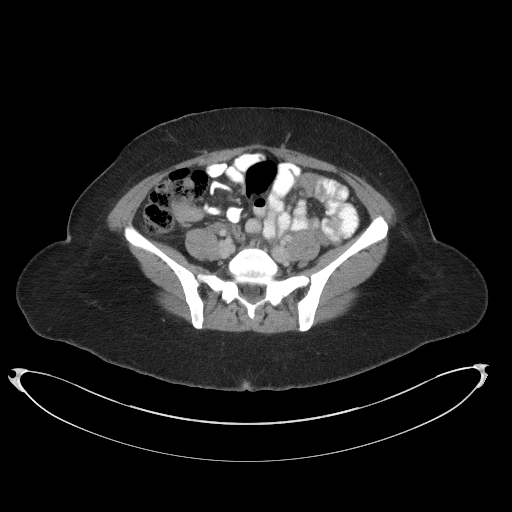
[im 41/89  soft-tissue]
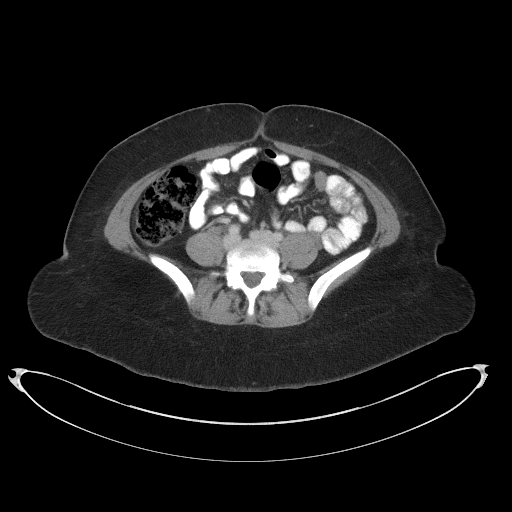
[im 48/89  soft-tissue]
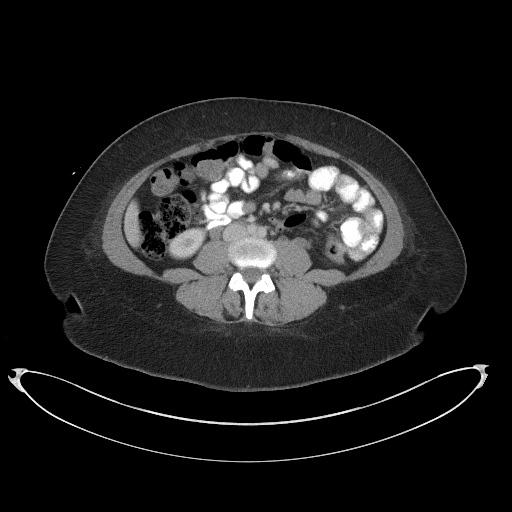
[im 52/89  soft-tissue]
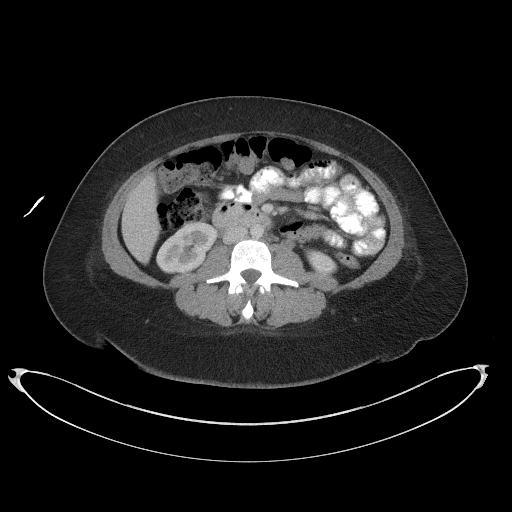
[im 52/89  bone]
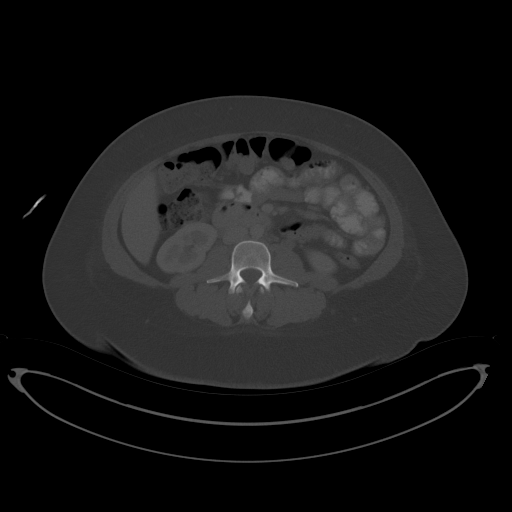
[im 59/89  soft-tissue]
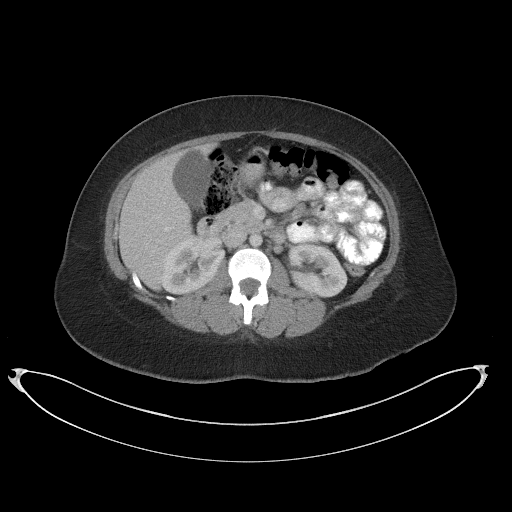
[im 67/89  soft-tissue]
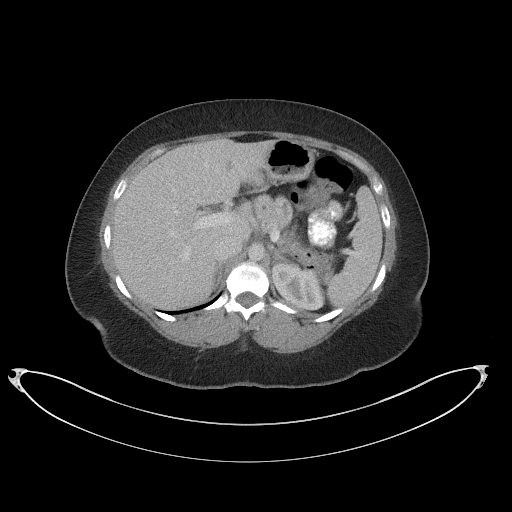
[im 70/89  soft-tissue]
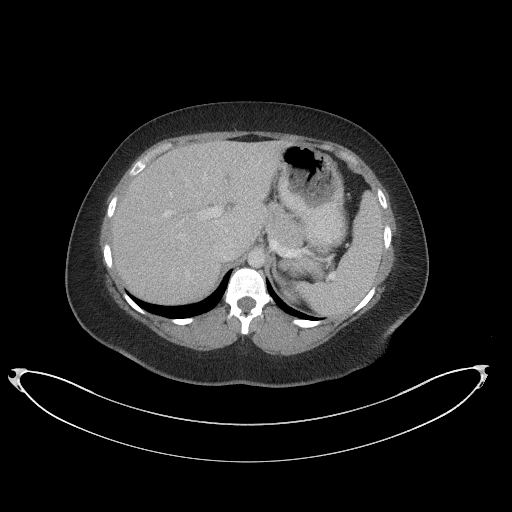
[im 78/89  soft-tissue]
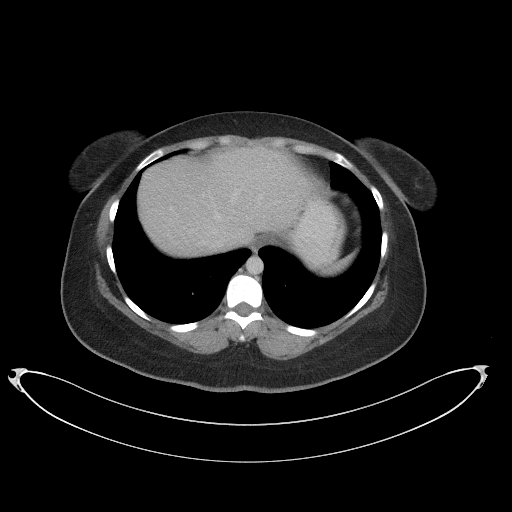
[im 85/89  soft-tissue]
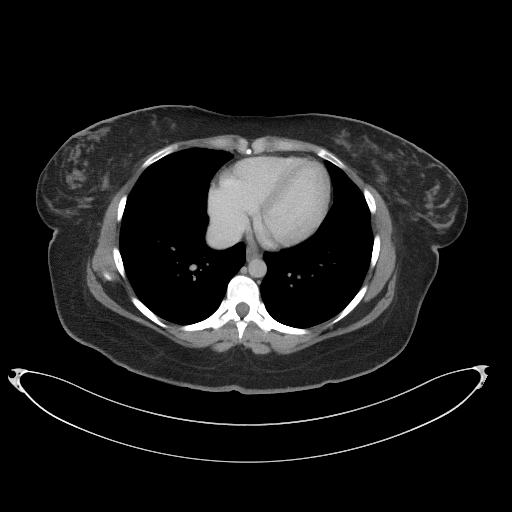

[Series 6: a/p w/ cor · coronal · 0.86mm/px · 3 of 156 slices shown]
[im 52/156  soft-tissue]
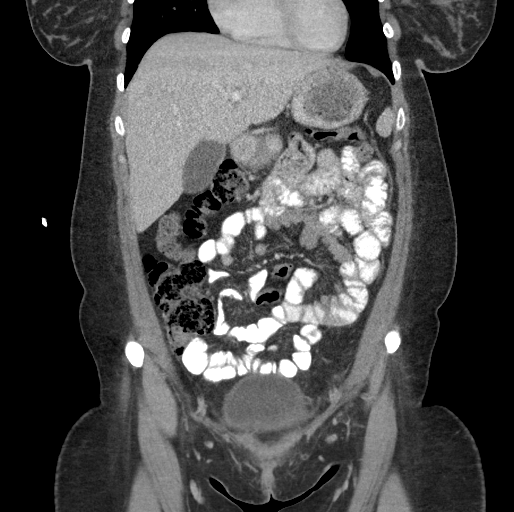
[im 69/156  soft-tissue]
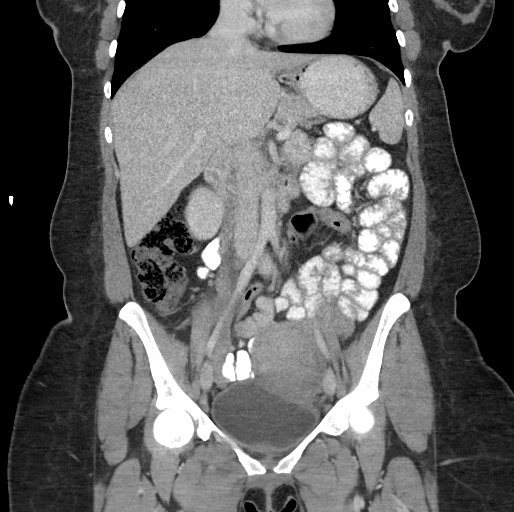
[im 87/156  soft-tissue]
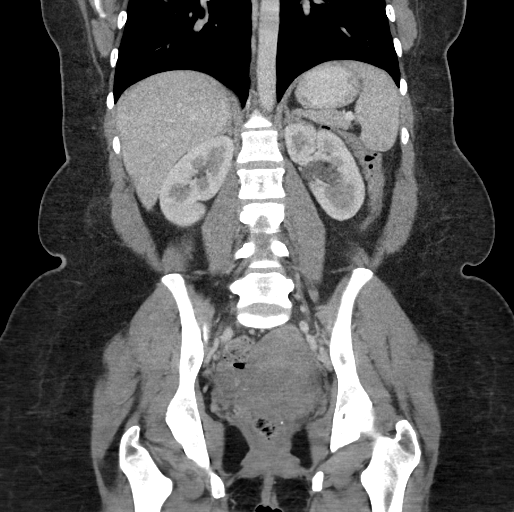

[17 of 46 positions shown; findings below may reference images not displayed]

FINDINGS: Lower chest: No acute abnormality.

Hepatobiliary: Unremarkable liver.  Unremarkable gallbladder

Pancreas: Unremarkable pancreas

Spleen: Unremarkable

Adrenals/Urinary Tract: Unremarkable appearance of the adrenal
glands. No evidence of hydronephrosis of the right or left kidney.
No nephrolithiasis. Unremarkable course of the bilateral ureters.
Unremarkable appearance of the urinary bladder.

Stomach/Bowel: Unremarkable stomach. Unremarkable small bowel.
Normal appendix. No significant stool burden. No evidence of
obstruction.

Vascular/Lymphatic: Unremarkable vasculature.  No adenopathy.

Reproductive: No significant enlargement of the uterus. No
significant intrauterine fluid. Unremarkable adnexa.

Minimal stranding of the anterior pelvic wall soft tissues with no
focal fluid or significant inflammation.

Other: No abdominal hernia. Unremarkable appearance of the anterior
abdominal wall musculature. No inguinal adenopathy.

Musculoskeletal: No acute fracture.
IMPRESSION: No acute CT finding.

Minimal stranding of the anterior pelvic wall soft tissues in this
patient with recent surgery. No evidence of focal fluid.

## 2019-05-15 ENCOUNTER — Ambulatory Visit: Payer: Medicaid Other

## 2019-05-22 ENCOUNTER — Ambulatory Visit: Payer: Medicaid Other

## 2019-05-29 ENCOUNTER — Telehealth: Payer: Self-pay | Admitting: Obstetrics

## 2019-06-20 ENCOUNTER — Emergency Department (HOSPITAL_COMMUNITY)
Admission: EM | Admit: 2019-06-20 | Discharge: 2019-06-20 | Disposition: A | Discharge status: Home / Self Care | Payer: Medicaid Other | Attending: Emergency Medicine | Admitting: Emergency Medicine

## 2019-06-20 DIAGNOSIS — L539 Erythematous condition, unspecified: Secondary | ICD-10-CM | POA: Diagnosis present

## 2019-06-20 DIAGNOSIS — Z87891 Personal history of nicotine dependence: Secondary | ICD-10-CM | POA: Diagnosis not present

## 2019-06-20 DIAGNOSIS — T8149XA Infection following a procedure, other surgical site, initial encounter: Secondary | ICD-10-CM | POA: Diagnosis not present

## 2019-06-20 DIAGNOSIS — L03311 Cellulitis of abdominal wall: Secondary | ICD-10-CM | POA: Insufficient documentation

## 2019-06-20 DIAGNOSIS — L0390 Cellulitis, unspecified: Secondary | ICD-10-CM

## 2019-06-20 DIAGNOSIS — Z5189 Encounter for other specified aftercare: Secondary | ICD-10-CM

## 2019-06-20 LAB — CBC
HCT: 40 % (ref 36.0–46.0)
Hemoglobin: 12.6 g/dL (ref 12.0–15.0)
MCH: 28.7 pg (ref 26.0–34.0)
MCHC: 31.5 g/dL (ref 30.0–36.0)
MCV: 91.1 fL (ref 80.0–100.0)
Platelets: 256 10*3/uL (ref 150–400)
RBC: 4.39 MIL/uL (ref 3.87–5.11)
RDW: 12.8 % (ref 11.5–15.5)
WBC: 6.2 10*3/uL (ref 4.0–10.5)
nRBC: 0 % (ref 0.0–0.2)

## 2019-06-20 MED ORDER — DOXYCYCLINE HYCLATE 100 MG PO CAPS: 100.0000 mg | ORAL_CAPSULE | Freq: Two times a day (BID) | ORAL | 0 refills | Status: AC

## 2019-06-20 NOTE — ED Triage Notes (Signed)
Pt here from home wanting to get her c section wound looked at , states that there is some slight drainage called her OB you told her to wait till Monday , no fever

## 2019-06-20 NOTE — ED Notes (Signed)
Pt verbalized understanding of discharge paperwork, prescriptions, and follow-up care. 

## 2019-06-20 NOTE — ED Provider Notes (Signed)
[facility] Provider Note   CSN: [id number] Arrival date & time: 06/20/19  1421    History   Chief Complaint Chief Complaint  Patient presents with  . Wound Check    HPI Claudia Miller is a 28 y.o. female.     HPI  Patient is a 28 year old female who presents the emergency department today for a wound check.  She states she had a C-section 5 months ago.  States yesterday she had some pain at the incision site and experienced a small amount of purulent drainage.  Denies any associated abdominal pain, nausea, vomiting or fevers.  States she called her OB/GYN who made an appointment for her on Monday.  She denies any other associated symptoms.  On review of prior records, patient did have postop cellulitis at the site of the wound.  She had no complicating factors associated with this.  No past medical history on file.  Patient Active Problem List   Diagnosis Date Noted  . Acute blood loss as cause of postoperative anemia 01/24/2019  . S/P cesarean section 01/23/2019  . PROM (premature rupture of membranes) 01/22/2019  . Hematuria 08/15/2018  . Trichomoniasis 08/09/2018  . Supervision of normal pregnancy, antepartum 07/12/2018    Past Surgical History:  Procedure Laterality Date  . CESAREAN SECTION N/A 01/23/2019   Procedure: CESAREAN SECTION;  Surgeon: Tereso NewcomerAnyanwu, Ugonna A, MD;  Location: [facility];  Service: Obstetrics;  Laterality: N/A;  . NO PAST SURGERIES       OB History    Gravida  3   Para  3   Term  3   Preterm      AB      Living  3     SAB      TAB      Ectopic      Multiple  0   Live Births  3            Home Medications    Prior to Admission medications   Medication Sig Start Date End Date Taking? Authorizing Provider  acetaminophen (TYLENOL) 500 MG tablet Take 1 tablet (500 mg total) by mouth every 6 (six) hours as needed. 02/06/19   Gerrit HeckEmly, Shelaine, CNM  docusate sodium (COLACE) 100  MG capsule Take 1 capsule (100 mg total) by mouth 2 (two) times daily. 02/06/19   Gerrit HeckEmly, Stacye, CNM  doxycycline (VIBRAMYCIN) 100 MG capsule Take 1 capsule (100 mg total) by mouth 2 (two) times daily for 7 days. 06/20/19 06/27/19  Lucita Montoya S, PA-C  ibuprofen (ADVIL,MOTRIN) 800 MG tablet Take 1 tablet (800 mg total) by mouth every 8 (eight) hours. 01/25/19   Cristine Polioaves, Harrison A, MD  medroxyPROGESTERone (DEPO-PROVERA) 150 MG/ML injection Inject 1 mL (150 mg total) into the muscle every 3 (three) months. 02/20/19   Constant, Peggy, MD  sulfamethoxazole-trimethoprim (BACTRIM DS,SEPTRA DS) 800-160 MG tablet Take 1 tablet by mouth 2 (two) times daily. 02/20/19   Tamera StandsWallace, Laurel S, DO    Family History Family History  Problem Relation Age of Onset  . Asthma Mother   . Diabetes Mother   . Hypertension Mother   . Cancer Father   . Hypertension Father   . Asthma Sister   . Hypertension Sister   . Asthma Brother   . Hypertension Brother     Social History Social History   Tobacco Use  . Smoking status: Former Smoker    Packs/day: 0.00    Types: Cigarettes  Quit date: 05/31/2018    Years since quitting: 1.0  . Smokeless tobacco: Never Used  Substance Use Topics  . Alcohol use: No  . Drug use: No     Allergies   Fish allergy   Review of Systems Review of Systems  Constitutional: Negative for fever.  HENT: Negative for ear pain and sore throat.   Eyes: Negative for visual disturbance.  Respiratory: Negative for cough and shortness of breath.   Cardiovascular: Negative for chest pain.  Gastrointestinal: Negative for abdominal pain, diarrhea, nausea and vomiting.  Genitourinary: Negative for dysuria and hematuria.  Musculoskeletal: Negative for back pain.  Skin: Positive for wound.  Neurological: Negative for headaches.  All other systems reviewed and are negative.   Physical Exam Updated Vital Signs BP 117/61 (BP Location: Right Arm)   Pulse 87   Temp 98.8 F (37.1 C)  (Oral)   Resp 16   SpO2 100%   Physical Exam Vitals signs and nursing note reviewed.  Constitutional:      General: She is not in acute distress.    Appearance: She is well-developed.  HENT:     Head: Normocephalic and atraumatic.  Eyes:     Conjunctiva/sclera: Conjunctivae normal.  Neck:     Musculoskeletal: Neck supple.  Cardiovascular:     Rate and Rhythm: Normal rate and regular rhythm.     Heart sounds: No murmur.  Pulmonary:     Effort: Pulmonary effort is normal. No respiratory distress.     Breath sounds: Normal breath sounds.  Abdominal:     Palpations: Abdomen is soft.     Tenderness: There is no abdominal tenderness.  Skin:    General: Skin is warm and dry.     Comments: Punctate open wound along the incision site of prior c-section with scant purulent drainage. Minimal TTP and surrounding erythema over the wound. No fluctuance. Minimal induration.   Neurological:     Mental Status: She is alert.     ED Treatments / Results  Labs (all labs ordered are listed, but only abnormal results are displayed) Labs Reviewed  CBC    EKG None  Radiology No results found.  Procedures Procedures (including critical care time)  Medications Ordered in ED Medications - No data to display   Initial Impression / Assessment and Plan / ED Course  I have reviewed the triage vital signs and the nursing notes.  Pertinent labs & imaging results that were available during my care of the patient were reviewed by me and considered in my medical decision making (see chart for details).   Final Clinical Impressions(s) / ED Diagnoses   Final diagnoses:  Visit for wound check  Cellulitis, unspecified cellulitis site   28 year old female presenting for wound check.  Had C-section 5 months ago and had minimal drainage and pain around the incision site.  Had a scant amount of purulent drainage yesterday.  No associated fevers or other systemic symptoms.  On exam has minimal  tenderness and a small amount of erythema and induration along the incision site.  No fluctuance or obvious drainable abscess.  No abdominal tenderness to suggest a deeper infection.  CBC was ordered in triage and there is no leukocytosis.  Patient's vital signs are within normal limits and she is afebrile.  I do think that the patient's infection is superficial.  She does not have a drainable abscess.  I will start her on antibiotics.  I have discussed the possibility of deeper infection but have  a lower suspicion for this and advised that if her symptoms persist or worsen that she needs to return to the emergency department immediately.  ED Discharge Orders         Ordered    doxycycline (VIBRAMYCIN) 100 MG capsule  2 times daily     06/20/19 [address]1612           Rosalina Dingwall S, PA-C 06/20/19 1612    Melene PlanFloyd, Dan, DO 06/21/19 [phone]

## 2019-06-20 NOTE — Discharge Instructions (Addendum)
You were given a prescription for antibiotics. Please take the antibiotic prescription fully.   Please follow-up with your OB/GYN as scheduled on Monday.  Please return to the emergency room immediately if you experience any new or worsening symptoms or any symptoms that indicate worsening infection such as fevers, increased redness/swelling/pain, warmth, or drainage from the affected area.

## 2019-07-02 ENCOUNTER — Ambulatory Visit (INDEPENDENT_AMBULATORY_CARE_PROVIDER_SITE_OTHER): Payer: Medicaid Other | Admitting: Medical

## 2019-07-02 ENCOUNTER — Encounter: Payer: Self-pay | Admitting: Medical

## 2019-07-02 ENCOUNTER — Other Ambulatory Visit: Payer: Self-pay

## 2019-07-02 VITALS — BP 108/61 | HR 82 | Ht 61.0 in | Wt 193.1 lb

## 2019-07-02 DIAGNOSIS — G8918 Other acute postprocedural pain: Secondary | ICD-10-CM

## 2019-07-02 DIAGNOSIS — Z98891 History of uterine scar from previous surgery: Secondary | ICD-10-CM | POA: Diagnosis not present

## 2019-07-02 MED ORDER — TRAMADOL HCL 50 MG PO TABS: 50.0000 mg | ORAL_TABLET | Freq: Four times a day (QID) | ORAL | 0 refills | Status: DC | PRN

## 2019-07-02 MED ORDER — GABAPENTIN 100 MG PO CAPS: 100.0000 mg | ORAL_CAPSULE | Freq: Three times a day (TID) | ORAL | 0 refills | Status: DC

## 2019-07-02 NOTE — Patient Instructions (Signed)

## 2019-07-02 NOTE — Progress Notes (Signed)
Dr. Loni Muse CT neurontin Tramadol Ibuprofen No drainage Stop doxy   History:  [name] is a 28 y.o. [id number] who presents to clinic today for evaluation of worsening abdominal pain and drainage from her c-section incision. She had CT scan in February after noting purulent discharge from her incision at her PP visit. CT was normal. Patient was put on Bactrim and completed course. She states that pain and discharge improved after that. She then noted increased pain and new discharge from her incision last week. She went to the [facility] for evaluation and was started on Doxycycline.    The following portions of the patient's history were reviewed and updated as appropriate: allergies, current medications, family history, past medical history, social history, past surgical history and problem list.  Review of Systems:  Review of Systems  Constitutional: Negative for fever and malaise/fatigue.  Gastrointestinal: Positive for abdominal pain. Negative for diarrhea.      Objective:  Physical Exam BP 108/61   Pulse 82   Ht 5\' 1"  (1.549 m)   Wt 193 lb 1.6 oz (87.6 kg)   Breastfeeding No   BMI 36.49 kg/m  Physical Exam  Nursing note and vitals reviewed. Constitutional: She is oriented to person, place, and time. She appears well-developed and well-nourished. No distress.  HENT:  Head: Normocephalic and atraumatic.  Cardiovascular: Normal rate.  Respiratory: Effort normal.  GI: Soft. She exhibits no distension. There is abdominal tenderness (TTP of left lateral incision site).    Neurological: She is alert and oriented to person, place, and time.  Skin: Skin is warm and dry. No erythema.  Psychiatric: She has a normal mood and affect.   MDM Dr. Harolyn Rutherford to bedside to evaluate the incision. No palpable masses or discharge expressed.  Recommends Ibuprofen for pain, Tramadol PRN for severe pain and Neurontin. CT scan outpatient first available. Discontinue Doxycycline.  Assessment &  Plan:  1. S/P cesarean section - Discontinue Doxycycline  - 800 mg Ibuprofen q 8 hours PRN for pain  - CT ABDOMEN PELVIS W CONTRAST; Future - traMADol (ULTRAM) 50 MG tablet; Take 1 tablet (50 mg total) by mouth every 6 (six) hours as needed.  Dispense: 12 tablet; Refill: 0 - gabapentin (NEURONTIN) 100 MG capsule; Take 1 capsule (100 mg total) by mouth 3 (three) times daily.  Dispense: 30 capsule; Refill: 0  2. Post-op pain   Claudia Miller 07/02/2019 2:36 PM

## 2019-07-02 NOTE — Progress Notes (Signed)
Pt is in the office complaining of incision pain from c section on 01-23-19. Pt went to hospital on 06-20-19 and took antibiotics. Pt states that she may be doing too much lifting at work. Pt also states that she was on depo but missed appt and has been sexually active since last week. Last depo 02-06-19.

## 2019-07-05 ENCOUNTER — Other Ambulatory Visit: Payer: Self-pay | Admitting: Medical

## 2019-07-05 DIAGNOSIS — G8918 Other acute postprocedural pain: Secondary | ICD-10-CM

## 2019-07-06 ENCOUNTER — Other Ambulatory Visit: Payer: Self-pay | Admitting: Medical

## 2019-07-06 DIAGNOSIS — D62 Acute posthemorrhagic anemia: Secondary | ICD-10-CM

## 2019-07-11 ENCOUNTER — Ambulatory Visit (HOSPITAL_COMMUNITY): Payer: Medicaid Other

## 2019-07-17 ENCOUNTER — Other Ambulatory Visit: Payer: Self-pay

## 2019-07-17 ENCOUNTER — Telehealth: Payer: Self-pay

## 2019-07-17 ENCOUNTER — Ambulatory Visit (HOSPITAL_COMMUNITY)
Admission: RE | Admit: 2019-07-17 | Discharge: 2019-07-17 | Disposition: A | Discharge status: Home / Self Care | Payer: Medicaid Other | Source: Ambulatory Visit | Attending: Medical | Admitting: Medical

## 2019-07-17 DIAGNOSIS — D62 Acute posthemorrhagic anemia: Secondary | ICD-10-CM

## 2019-07-17 DIAGNOSIS — N83291 Other ovarian cyst, right side: Secondary | ICD-10-CM | POA: Diagnosis not present

## 2019-07-17 DIAGNOSIS — G8918 Other acute postprocedural pain: Secondary | ICD-10-CM

## 2019-07-17 NOTE — Telephone Encounter (Signed)
Returned call, pt wanted to know if she can return to work with or without restrictions, after having U/S done today. Messaged provider for further review.

## 2019-07-18 ENCOUNTER — Telehealth: Payer: Self-pay

## 2019-07-18 NOTE — Telephone Encounter (Signed)
S/w pt and advised that provider approved work restrictions. Faxed letter

## 2019-07-25 ENCOUNTER — Encounter (HOSPITAL_COMMUNITY): Payer: Self-pay

## 2019-07-25 ENCOUNTER — Ambulatory Visit (HOSPITAL_COMMUNITY)
Admission: RE | Admit: 2019-07-25 | Discharge: 2019-07-25 | Disposition: A | Discharge status: Home / Self Care | Payer: Medicaid Other | Source: Ambulatory Visit | Attending: Medical | Admitting: Medical

## 2019-07-25 ENCOUNTER — Other Ambulatory Visit: Payer: Self-pay

## 2019-07-25 DIAGNOSIS — N838 Other noninflammatory disorders of ovary, fallopian tube and broad ligament: Secondary | ICD-10-CM | POA: Diagnosis not present

## 2019-07-25 DIAGNOSIS — G8918 Other acute postprocedural pain: Secondary | ICD-10-CM

## 2019-07-25 DIAGNOSIS — Z98891 History of uterine scar from previous surgery: Secondary | ICD-10-CM | POA: Diagnosis not present

## 2019-07-25 DIAGNOSIS — N83201 Unspecified ovarian cyst, right side: Secondary | ICD-10-CM | POA: Diagnosis not present

## 2019-07-25 MED ORDER — SODIUM CHLORIDE (PF) 0.9 % IJ SOLN
INTRAMUSCULAR | Status: AC
  Filled 2019-07-25: qty 50

## 2019-07-25 MED ORDER — IOHEXOL 300 MG/ML  SOLN
100.0000 mL | Freq: Once | INTRAMUSCULAR | Status: AC | PRN
  Administered 2019-07-25: 100 mL via INTRAVENOUS

## 2019-08-25 IMAGING — US US PELVIS COMPLETE WITH TRANSVAGINAL
1 series · 15 of 25 positions shown · non-contrast
Comparison: CT 02/20/2019

CLINICAL DATA: Prior C-section. Abdominal tenderness along the
incision



[Series 1: us pelvis complete with transvaginal · 15 of 79 slices shown]
[im 1/79]
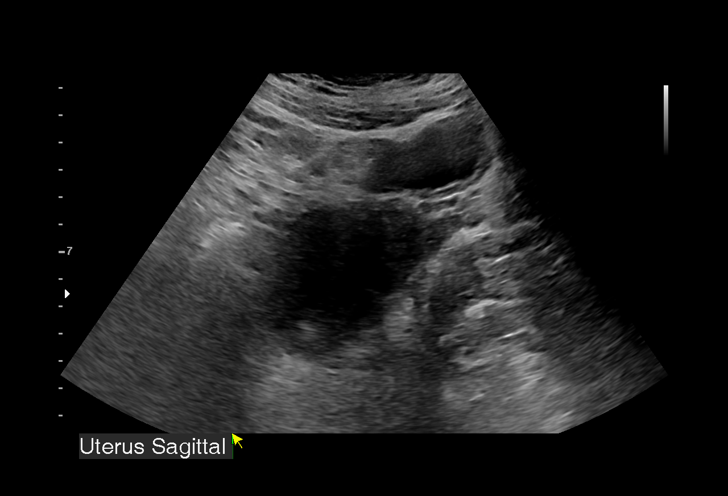
[im 7/79]
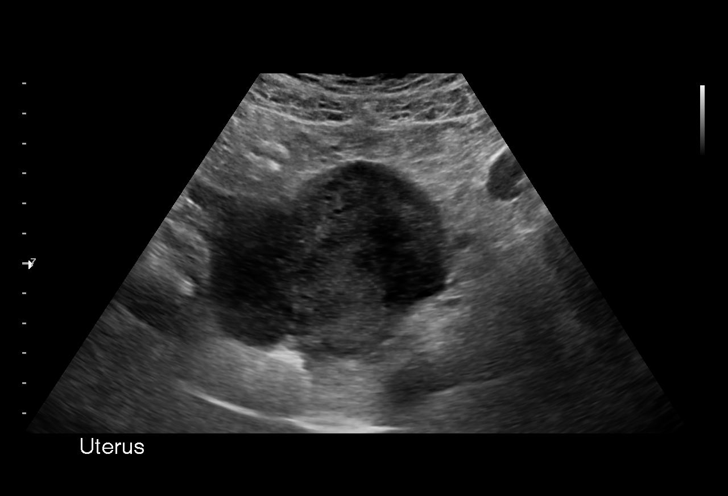
[im 14/79]
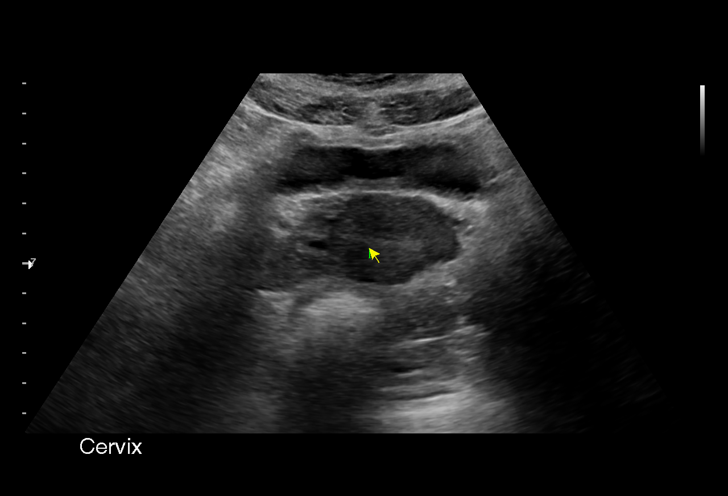
[im 17/79]
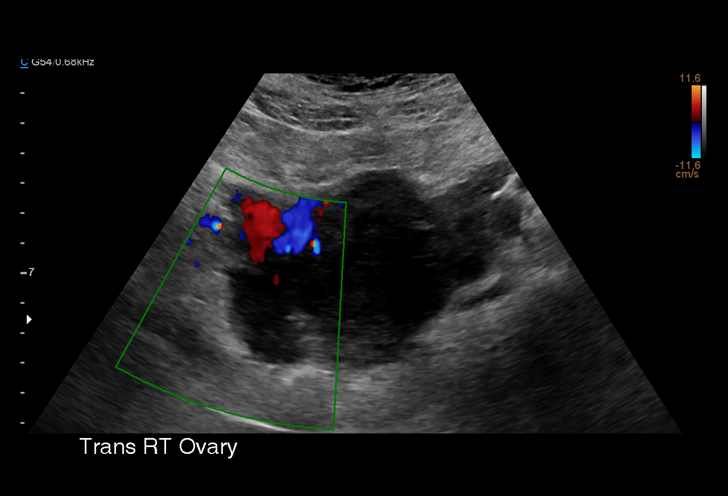
[im 23/79]
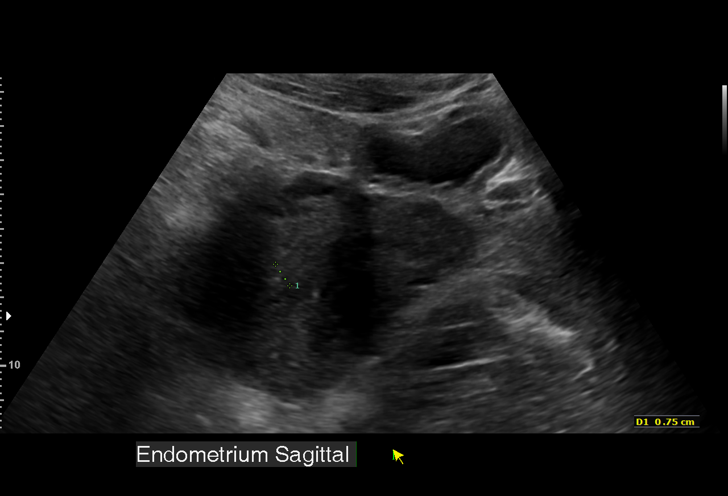
[im 30/79]
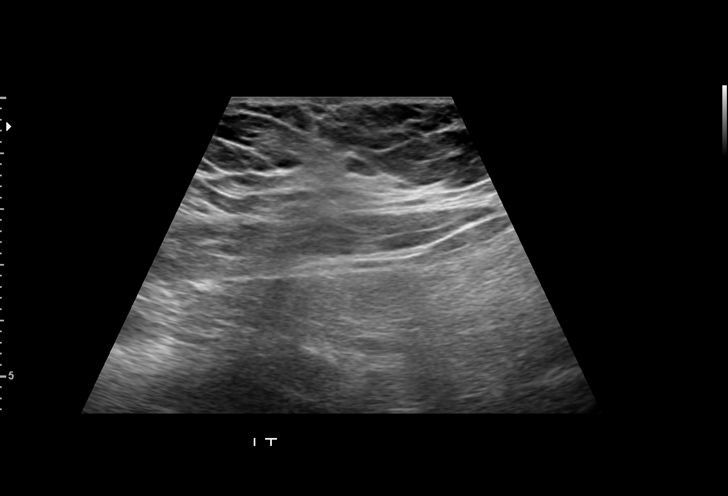
[im 33/79]
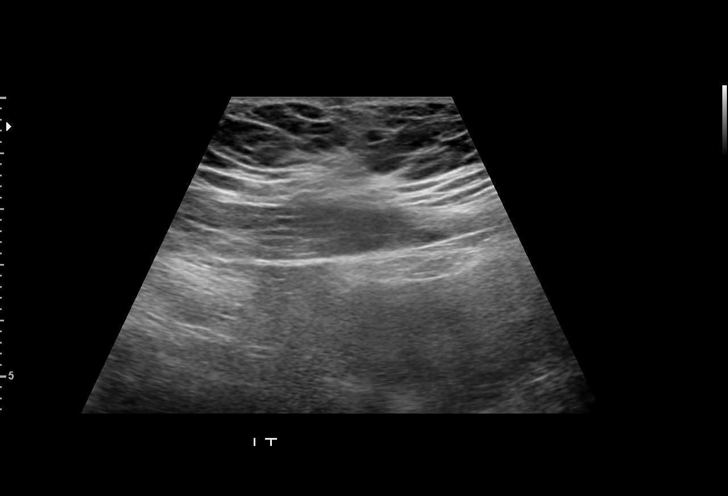
[im 40/79]
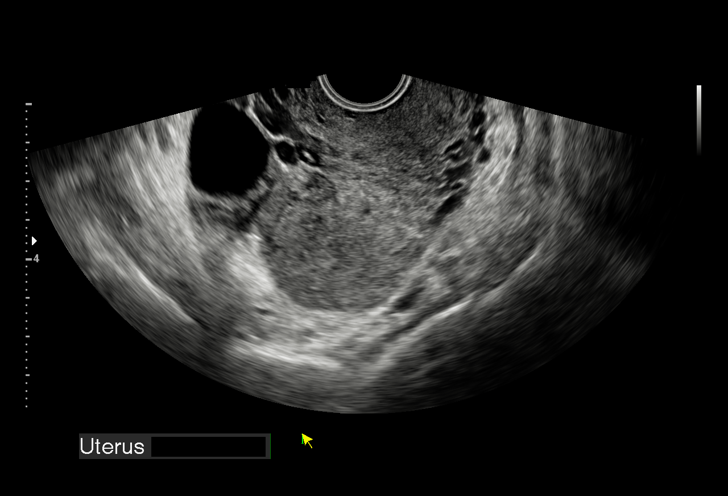
[im 46/79]
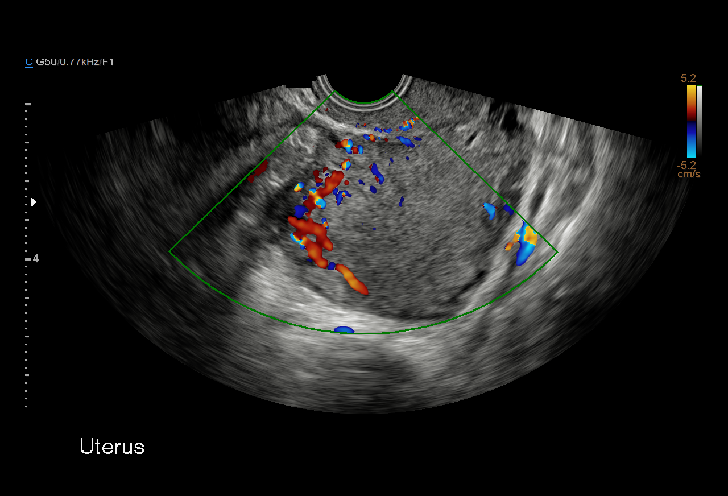
[im 49/79]
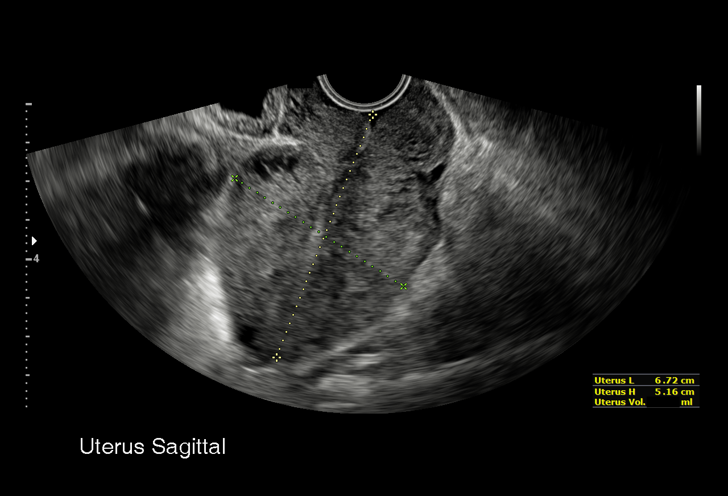
[im 56/79]
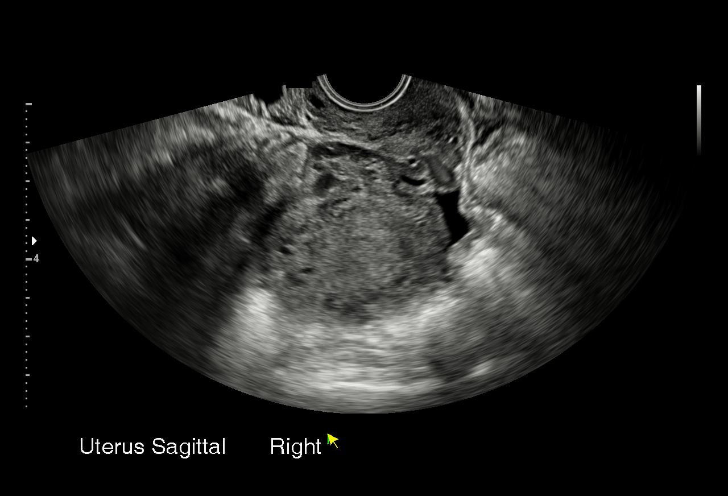
[im 62/79]
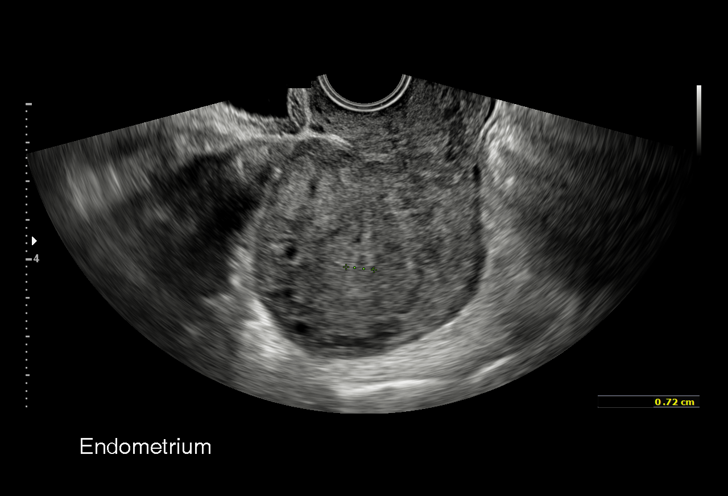
[im 66/79]
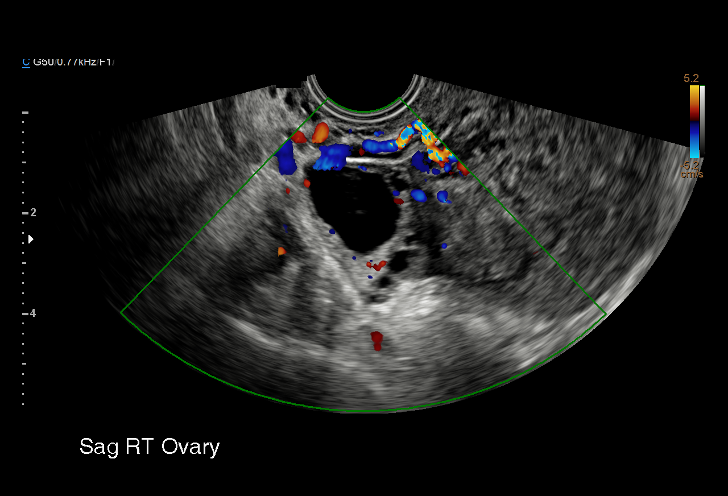
[im 72/79]
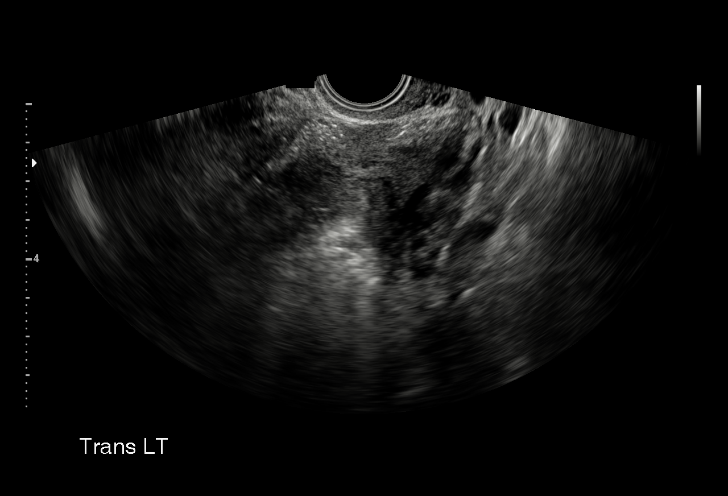
[im 79/79]
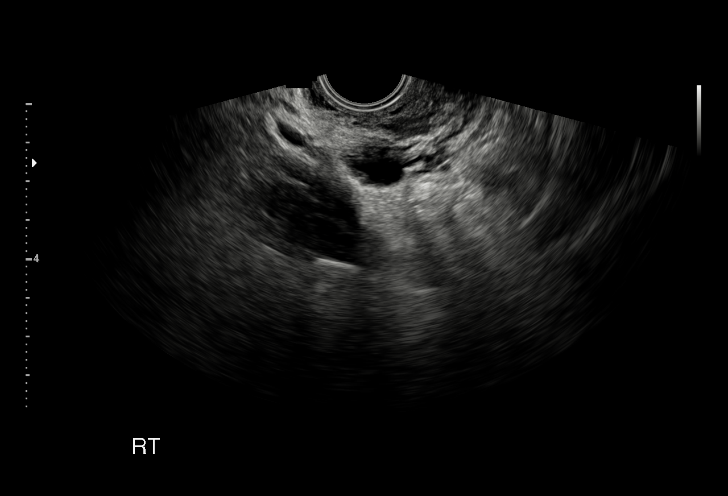

[15 of 25 positions shown; findings below may reference images not displayed]

FINDINGS: Uterus

Measurements: 6.7 x 5.2 x 6.0 cm = volume: 138 mL. No fibroids or
other mass visualized.

Endometrium

Thickness: 7 mm in thickness.  No focal abnormality visualized.

Right ovary

Measurements: 3.6 x 2.5 x 2.8 cm = volume: 12.5 mL. 3.4 cm simple
cyst within the right ovary. No adnexal mass.

Left ovary

Measurements: 3.5 x 1.0 x 1.0 cm = volume: 1.8 mL. Normal
appearance/no adnexal mass.

Other findings

No abnormal free fluid. No abnormality such as fluid collection
along the incision.
IMPRESSION: Small right ovarian cyst which is simple, 3.4 cm. This has benign
characteristics and is a common finding in premenopausal females. No
imaging follow up is required. This follows consensus guidelines:
Simple Adnexal Cysts: SRU Consensus Conference Update on Follow-up
and Reporting. Radiology 0413; [DATE].

No abnormality noted along the incision.

No acute findings.

## 2019-09-10 ENCOUNTER — Encounter: Payer: Self-pay | Admitting: *Deleted

## 2019-09-20 DIAGNOSIS — Z5181 Encounter for therapeutic drug level monitoring: Secondary | ICD-10-CM | POA: Diagnosis not present

## 2019-09-26 DIAGNOSIS — Z5181 Encounter for therapeutic drug level monitoring: Secondary | ICD-10-CM | POA: Diagnosis not present

## 2019-10-04 DIAGNOSIS — Z5181 Encounter for therapeutic drug level monitoring: Secondary | ICD-10-CM | POA: Diagnosis not present

## 2019-10-11 DIAGNOSIS — Z5181 Encounter for therapeutic drug level monitoring: Secondary | ICD-10-CM | POA: Diagnosis not present

## 2019-10-18 DIAGNOSIS — Z5181 Encounter for therapeutic drug level monitoring: Secondary | ICD-10-CM | POA: Diagnosis not present

## 2019-10-25 DIAGNOSIS — Z5181 Encounter for therapeutic drug level monitoring: Secondary | ICD-10-CM | POA: Diagnosis not present

## 2019-11-01 DIAGNOSIS — Z5181 Encounter for therapeutic drug level monitoring: Secondary | ICD-10-CM | POA: Diagnosis not present

## 2019-11-06 DIAGNOSIS — Z5181 Encounter for therapeutic drug level monitoring: Secondary | ICD-10-CM | POA: Diagnosis not present

## 2019-11-14 DIAGNOSIS — Z5181 Encounter for therapeutic drug level monitoring: Secondary | ICD-10-CM | POA: Diagnosis not present

## 2019-11-20 DIAGNOSIS — Z5181 Encounter for therapeutic drug level monitoring: Secondary | ICD-10-CM | POA: Diagnosis not present

## 2019-11-26 DIAGNOSIS — Z5181 Encounter for therapeutic drug level monitoring: Secondary | ICD-10-CM | POA: Diagnosis not present

## 2019-12-04 DIAGNOSIS — Z5181 Encounter for therapeutic drug level monitoring: Secondary | ICD-10-CM | POA: Diagnosis not present

## 2019-12-11 DIAGNOSIS — Z5181 Encounter for therapeutic drug level monitoring: Secondary | ICD-10-CM | POA: Diagnosis not present

## 2019-12-24 DIAGNOSIS — Z5181 Encounter for therapeutic drug level monitoring: Secondary | ICD-10-CM | POA: Diagnosis not present

## 2019-12-28 NOTE — L&D Delivery Note (Addendum)
LABOR COURSE 28 y/o [id number] at [redacted]w[redacted]d presenting in spontaneous labor. GBS unknown, swab pending, given PCN x2. AROM @ 0930, clear. Labor Course uncomplicated, cat 2 FHT with variable decels that resolved with repositioning.   Delivery Note Called to room and patient was complete and pushing. Head delivered OA with LOA Restitution. No nuchal cord present. Shoulder and body delivered in usual fashion. At 1237 a viable and healthy female was delivered via Vaginal, Spontaneous (Presentation:OA; LOA).  Infant with spontaneous cry, placed on mother's abdomen, dried and stimulated. Cord clamped x 2 after 1-minute delay, and cut by FOB. Cord blood drawn. Placenta delivered spontaneously with gentle cord traction. Appears intact. Fundus firm with massage and Pitocin. Labia, perineum, vagina, and cervix inspected with no lacerations.   APGAR: 9,9 ; weight pending.  Cord: 3VC  Cord pH: pending  Anesthesia: Epidural Episiotomy: None Lacerations: None Est. Blood Loss (mL): 100  Mom to postpartum.  Baby to Couplet care / Skin to Skin.  Betti Cruz, MD PGY-1 11/13/2020 12:58 PM

## 2020-01-07 DIAGNOSIS — Z5181 Encounter for therapeutic drug level monitoring: Secondary | ICD-10-CM | POA: Diagnosis not present

## 2020-01-14 DIAGNOSIS — Z5181 Encounter for therapeutic drug level monitoring: Secondary | ICD-10-CM | POA: Diagnosis not present

## 2020-01-21 DIAGNOSIS — Z5181 Encounter for therapeutic drug level monitoring: Secondary | ICD-10-CM | POA: Diagnosis not present

## 2020-01-28 DIAGNOSIS — Z5181 Encounter for therapeutic drug level monitoring: Secondary | ICD-10-CM | POA: Diagnosis not present

## 2020-01-30 DIAGNOSIS — Z20828 Contact with and (suspected) exposure to other viral communicable diseases: Secondary | ICD-10-CM | POA: Diagnosis not present

## 2020-02-04 DIAGNOSIS — Z5181 Encounter for therapeutic drug level monitoring: Secondary | ICD-10-CM | POA: Diagnosis not present

## 2020-02-11 DIAGNOSIS — Z5181 Encounter for therapeutic drug level monitoring: Secondary | ICD-10-CM | POA: Diagnosis not present

## 2020-04-15 ENCOUNTER — Encounter (HOSPITAL_COMMUNITY): Payer: Self-pay | Admitting: Family Medicine

## 2020-04-15 ENCOUNTER — Ambulatory Visit (INDEPENDENT_AMBULATORY_CARE_PROVIDER_SITE_OTHER): Payer: Medicaid Other

## 2020-04-15 ENCOUNTER — Inpatient Hospital Stay (HOSPITAL_COMMUNITY)
Admission: AD | Admit: 2020-04-15 | Discharge: 2020-04-15 | Disposition: A | Discharge status: Home / Self Care | Payer: Medicaid Other | Attending: Family Medicine | Admitting: Family Medicine

## 2020-04-15 ENCOUNTER — Other Ambulatory Visit: Payer: Self-pay

## 2020-04-15 VITALS — BP 107/71 | HR 60 | Wt 169.2 lb

## 2020-04-15 DIAGNOSIS — Z3201 Encounter for pregnancy test, result positive: Secondary | ICD-10-CM

## 2020-04-15 DIAGNOSIS — O210 Mild hyperemesis gravidarum: Secondary | ICD-10-CM | POA: Diagnosis not present

## 2020-04-15 DIAGNOSIS — Z87891 Personal history of nicotine dependence: Secondary | ICD-10-CM | POA: Insufficient documentation

## 2020-04-15 DIAGNOSIS — F1290 Cannabis use, unspecified, uncomplicated: Secondary | ICD-10-CM

## 2020-04-15 DIAGNOSIS — Z3A01 Less than 8 weeks gestation of pregnancy: Secondary | ICD-10-CM | POA: Diagnosis not present

## 2020-04-15 DIAGNOSIS — R824 Acetonuria: Secondary | ICD-10-CM | POA: Diagnosis not present

## 2020-04-15 DIAGNOSIS — Z3200 Encounter for pregnancy test, result unknown: Secondary | ICD-10-CM

## 2020-04-15 DIAGNOSIS — O99891 Other specified diseases and conditions complicating pregnancy: Secondary | ICD-10-CM

## 2020-04-15 DIAGNOSIS — R1115 Cyclical vomiting syndrome unrelated to migraine: Secondary | ICD-10-CM | POA: Diagnosis not present

## 2020-04-15 HISTORY — DX: Cannabis use, unspecified, uncomplicated: F12.90

## 2020-04-15 LAB — URINALYSIS, ROUTINE W REFLEX MICROSCOPIC
Bilirubin Urine: NEGATIVE
Glucose, UA: NEGATIVE mg/dL
Hgb urine dipstick: NEGATIVE
Ketones, ur: 20 mg/dL — AB
Leukocytes,Ua: NEGATIVE
Nitrite: NEGATIVE
Protein, ur: NEGATIVE mg/dL
Specific Gravity, Urine: 1.018 (ref 1.005–1.030)
pH: 7 (ref 5.0–8.0)

## 2020-04-15 LAB — CBC
HCT: 42.8 % (ref 36.0–46.0)
Hemoglobin: 14 g/dL (ref 12.0–15.0)
MCH: 30.6 pg (ref 26.0–34.0)
MCHC: 32.7 g/dL (ref 30.0–36.0)
MCV: 93.7 fL (ref 80.0–100.0)
Platelets: 219 10*3/uL (ref 150–400)
RBC: 4.57 MIL/uL (ref 3.87–5.11)
RDW: 12 % (ref 11.5–15.5)
WBC: 4.4 10*3/uL (ref 4.0–10.5)
nRBC: 0 % (ref 0.0–0.2)

## 2020-04-15 LAB — RAPID URINE DRUG SCREEN, HOSP PERFORMED
Amphetamines: NOT DETECTED
Barbiturates: NOT DETECTED
Benzodiazepines: NOT DETECTED
Cocaine: NOT DETECTED
Opiates: NOT DETECTED
Tetrahydrocannabinol: POSITIVE — AB

## 2020-04-15 LAB — COMPREHENSIVE METABOLIC PANEL
ALT: 10 U/L (ref 0–44)
AST: 16 U/L (ref 15–41)
Albumin: 3.9 g/dL (ref 3.5–5.0)
Alkaline Phosphatase: 52 U/L (ref 38–126)
Anion gap: 10 (ref 5–15)
BUN: 5 mg/dL — ABNORMAL LOW (ref 6–20)
CO2: 23 mmol/L (ref 22–32)
Calcium: 9 mg/dL (ref 8.9–10.3)
Chloride: 102 mmol/L (ref 98–111)
Creatinine, Ser: 0.63 mg/dL (ref 0.44–1.00)
GFR calc Af Amer: >60 mL/min (ref >60)
GFR calc non Af Amer: >60 mL/min (ref >60)
Glucose, Bld: 85 mg/dL (ref 70–99)
Potassium: 3.7 mmol/L (ref 3.5–5.1)
Sodium: 135 mmol/L (ref 135–145)
Total Bilirubin: 0.7 mg/dL (ref 0.3–1.2)
Total Protein: 7.7 g/dL (ref 6.5–8.1)

## 2020-04-15 LAB — POCT URINE PREGNANCY: Preg Test, Ur: POSITIVE — AB

## 2020-04-15 MED ORDER — PROMETHAZINE HCL 25 MG PO TABS: 25.0000 mg | ORAL_TABLET | Freq: Four times a day (QID) | ORAL | 0 refills | Status: DC | PRN

## 2020-04-15 MED ORDER — ONDANSETRON 4 MG PO TBDP
4.0000 mg | ORAL_TABLET | Freq: Once | ORAL | Status: AC
  Administered 2020-04-15: 4 mg via ORAL
  Filled 2020-04-15: qty 1

## 2020-04-15 MED ORDER — LACTATED RINGERS IV BOLUS
1000.0000 mL | Freq: Once | INTRAVENOUS | Status: AC
  Administered 2020-04-15: 16:00:00 1000 mL via INTRAVENOUS

## 2020-04-15 MED ORDER — DOXYLAMINE-PYRIDOXINE 10-10 MG PO TBEC: 2.0000 | DELAYED_RELEASE_TABLET | Freq: Every day | ORAL | 5 refills | Status: DC

## 2020-04-15 MED ORDER — PREPLUS 27-1 MG PO TABS: 1.0000 | ORAL_TABLET | Freq: Every day | ORAL | 13 refills | Status: DC

## 2020-04-15 NOTE — MAU Provider Note (Signed)
History     CSN: [id number]  Arrival date and time: 04/15/20 1416   First Provider Initiated Contact with Patient 04/15/20 1551      Chief Complaint  Patient presents with  . Dizziness  . Emesis   HPI Claudia Miller is a 29 y.o. [phone] at [redacted]w[redacted]d who presents to MAU from [facility] clinic for evaluation of fatigue, nausea and vomiting in early pregnancy. She states she vomits up to five times each day and has been unable to tolerate anything beyond sips of water in the past several days.  She denies dysuria, abdominal tenderness, vaginal bleeding, fever or recent illness.  OB History    Gravida  4   Para  3   Term  3   Preterm      AB      Living  3     SAB      TAB      Ectopic      Multiple  0   Live Births  3           History reviewed. No pertinent past medical history.  Past Surgical History:  Procedure Laterality Date  . CESAREAN SECTION N/A 01/23/2019   Procedure: CESAREAN SECTION;  Surgeon: Osborne Oman, MD;  Location: [facility];  Service: Obstetrics;  Laterality: N/A;  . NO PAST SURGERIES      Family History  Problem Relation Age of Onset  . Asthma Mother   . Diabetes Mother   . Hypertension Mother   . Cancer Father   . Hypertension Father   . Asthma Sister   . Hypertension Sister   . Asthma Brother   . Hypertension Brother     Social History   Tobacco Use  . Smoking status: Former Smoker    Packs/day: 0.00    Types: Cigarettes    Quit date: 05/31/2018    Years since quitting: 1.8  . Smokeless tobacco: Never Used  Substance Use Topics  . Alcohol use: No  . Drug use: No    Allergies:  Allergies  Allergen Reactions  . Fish Allergy Anaphylaxis    No medications prior to admission.    Review of Systems  Constitutional: Positive for appetite change and fatigue.  Respiratory: Negative for shortness of breath.   Gastrointestinal: Positive for nausea and vomiting. Negative for abdominal pain.  Genitourinary:  Negative for vaginal bleeding.  Neurological: Positive for dizziness and weakness. Negative for syncope.  All other systems reviewed and are negative.  Physical Exam   Blood pressure (!) 116/47, pulse (!) 57, temperature 98.5 F (36.9 C), temperature source Oral, resp. rate 16, height 5\' 1"  (1.549 m), weight 77.3 kg, last menstrual period 03/12/2020, SpO2 100 %, not currently breastfeeding.  Physical Exam  Nursing note and vitals reviewed. Constitutional: She appears well-developed and well-nourished.  Cardiovascular: Normal rate and normal heart sounds.  Respiratory: Effort normal and breath sounds normal.  GI: Soft. Bowel sounds are normal. She exhibits no distension. There is no abdominal tenderness. There is no rebound and no guarding.  Skin: Skin is warm and dry.  Psychiatric: She has a normal mood and affect. Her behavior is normal. Judgment and thought content normal.    MAU Course/MDM  Procedures   --Patient unaware of antiemetics ordered by [facility] --B6 +Unisom with dietary changes as primary intervention. Discussed slow, bland diet --Advised abstinence from [facility] due to concern for Cannabis Hyperemesis  Orders Placed This Encounter  Procedures  . Urinalysis, Routine w  reflex microscopic  . CBC  . Comprehensive metabolic panel  . Rapid urine drug screen (hospital performed)  . Insert peripheral IV   Patient Vitals for the past 24 hrs:  BP Temp Temp src Pulse Resp SpO2 Height Weight  04/15/20 1727 (!) 116/47 -- -- (!) 57 -- -- -- --  04/15/20 1447 (!) 118/50 98.5 F (36.9 C) Oral 60 16 100 % 5\' 1"  (1.549 m) 77.3 kg   Results for orders placed or performed during the hospital encounter of 04/15/20 (from the past 24 hour(s))  Urinalysis, Routine w reflex microscopic     Status: Abnormal   Collection Time: 04/15/20  3:30 PM  Result Value Ref Range   Color, Urine YELLOW YELLOW   APPearance CLEAR CLEAR   Specific Gravity, Urine 1.018 1.005 - 1.030   pH 7.0 5.0 - 8.0    Glucose, UA NEGATIVE NEGATIVE mg/dL   Hgb urine dipstick NEGATIVE NEGATIVE   Bilirubin Urine NEGATIVE NEGATIVE   Ketones, ur 20 (A) NEGATIVE mg/dL   Protein, ur NEGATIVE NEGATIVE mg/dL   Nitrite NEGATIVE NEGATIVE   Leukocytes,Ua NEGATIVE NEGATIVE  CBC     Status: None   Collection Time: 04/15/20  3:42 PM  Result Value Ref Range   WBC 4.4 4.0 - 10.5 K/uL   RBC 4.57 3.87 - 5.11 MIL/uL   Hemoglobin 14.0 12.0 - 15.0 g/dL   HCT 04/17/20 10.2 - 58.5 %   MCV 93.7 80.0 - 100.0 fL   MCH 30.6 26.0 - 34.0 pg   MCHC 32.7 30.0 - 36.0 g/dL   RDW 27.7 82.4 - 23.5 %   Platelets 219 150 - 400 K/uL   nRBC 0.0 0.0 - 0.2 %  Comprehensive metabolic panel     Status: Abnormal   Collection Time: 04/15/20  3:42 PM  Result Value Ref Range   Sodium 135 135 - 145 mmol/L   Potassium 3.7 3.5 - 5.1 mmol/L   Chloride 102 98 - 111 mmol/L   CO2 23 22 - 32 mmol/L   Glucose, Bld 85 70 - 99 mg/dL   BUN 5 (L) 6 - 20 mg/dL   Creatinine, Ser 04/17/20 0.44 - 1.00 mg/dL   Calcium 9.0 8.9 - 4.43 mg/dL   Total Protein 7.7 6.5 - 8.1 g/dL   Albumin 3.9 3.5 - 5.0 g/dL   AST 16 15 - 41 U/L   ALT 10 0 - 44 U/L   Alkaline Phosphatase 52 38 - 126 U/L   Total Bilirubin 0.7 0.3 - 1.2 mg/dL   GFR calc non Af Amer >60 >60 mL/min   GFR calc Af Amer >60 >60 mL/min   Anion gap 10 5 - 15  Rapid urine drug screen (hospital performed)     Status: Abnormal   Collection Time: 04/15/20  5:00 PM  Result Value Ref Range   Opiates NONE DETECTED NONE DETECTED   Cocaine NONE DETECTED NONE DETECTED   Benzodiazepines NONE DETECTED NONE DETECTED   Amphetamines NONE DETECTED NONE DETECTED   Tetrahydrocannabinol POSITIVE (A) NONE DETECTED   Barbiturates NONE DETECTED NONE DETECTED   Assessment and Plan  -29 y.o. 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

## 2020-04-15 NOTE — Discharge Instructions (Signed)
Follow these instructions at home: Eating and drinking   Avoid the following: ? Drinking fluids with meals. Try not to drink anything during the 30 minutes before and after your meals. ? Drinking more than 1 cup of fluid at a time. ? Eating foods that trigger your symptoms. These may include spicy foods, coffee, high-fat foods, very sweet foods, and acidic foods. ? Skipping meals. Nausea can be more intense on an empty stomach. If you cannot tolerate food, do not force it. Try sucking on ice chips or other frozen items and make up for missed calories later. ? Lying down within 2 hours after eating. ? Being exposed to environmental triggers. These may include food smells, smoky rooms, closed spaces, rooms with strong smells, warm or humid places, overly loud and noisy rooms, and rooms with motion or flickering lights. Try eating meals in a well-ventilated area that is free of strong smells. ? Quick and sudden changes in your movement. ? Taking iron pills and multivitamins that contain iron. If you take prescription iron pills, do not stop taking them unless your health care provider approves. ? Preparing food. The smell of food can spoil your appetite or trigger nausea.  To help relieve your symptoms: ? Listen to your body. Everyone is different and has different preferences. Find what works best for you. ? Eat and drink slowly. ? Eat 5-6 small meals daily instead of 3 large meals. Eating small meals and snacks can help you avoid an empty stomach. ? In the morning, before getting out of bed, eat a couple of crackers to avoid moving around on an empty stomach. ? Try eating starchy foods as these are usually tolerated well. Examples include cereal, toast, bread, potatoes, pasta, rice, and pretzels. ? Include at least 1 serving of protein with your meals and snacks. Protein options include lean meats, poultry, seafood, beans, nuts, nut butters, eggs, cheese, and yogurt. ? Try eating a protein-rich  snack before bed. Examples of a protein-rick snack include cheese and crackers or a peanut butter sandwich made with 1 slice of whole-wheat bread and 1 tsp (5 g) of peanut butter. ? Eat or suck on things that have ginger in them. It may help relieve nausea. Add  tsp ground ginger to hot tea or choose ginger tea. ? Try drinking 100% fruit juice or an electrolyte drink. An electrolyte drink contains sodium, potassium, and chloride. ? Drink fluids that are cold, clear, and carbonated or sour. Examples include lemonade, ginger ale, lemon-lime soda, ice water, and sparkling water. ? Brush your teeth or use a mouth rinse after meals. ? Talk with your health care provider about starting a supplement of vitamin B6. General instructions  Take over-the-counter and prescription medicines only as told by your health care provider.  Follow instructions from your health care provider about eating or drinking restrictions.  Continue to take your prenatal vitamins as told by your health care provider. If you are having trouble taking your prenatal vitamins, talk with your health care provider about different options.  Keep all follow-up and pre-birth (prenatal) visits as told by your health care provider. This is important. Contact a health care provider if:  You have pain in your abdomen.  You have a severe headache.  You have vision problems.  You are losing weight.  You feel weak or dizzy. Get help right away if:  You cannot drink fluids without vomiting.  You vomit blood.  You have constant nausea and vomiting.  You are   very weak.  You faint.  You have a fever and your symptoms suddenly get worse. Summary  Hyperemesis gravidarum is a severe form of nausea and vomiting that happens during pregnancy.  Making some changes to your eating habits may help relieve nausea and vomiting.  This condition may be managed with medicine.  If medicines do not help relieve nausea and vomiting, you  may need to receive fluids through an IV at the hospital. This information is not intended to replace advice given to you by your health care provider. Make sure you discuss any questions you have with your health care provider. Document Revised: 01/02/2018 Document Reviewed: 08/11/2016 Elsevier Patient Education  2020 [employer]  

## 2020-04-15 NOTE — MAU Note (Signed)
Has been throwing up, been dizzy.  preg confirmed today at clinic. Was sent over from clinic for eval, ? Dehydration.

## 2020-04-15 NOTE — Progress Notes (Signed)
PRENATAL INTAKE SUMMARY  Claudia Miller presents today for Urine Pregnancy Test. UPT positive.  OB History    Gravida  3   Para  3   Term  3   Preterm      AB      Living  3     SAB      TAB      Ectopic      Multiple  0   Live Births  3           SUBJECTIVE She complains of nausea and vomiting for the last 3 days, she reports feeling lightheaded, dizzy, and dehydrated.   OBJECTIVE Initial Physical Exam (New OB)  GENERAL APPEARANCE: lethargic and pale. General malaise.   ASSESSMENT Normal pregnancy  PLAN Schedule for televisit New OB Nurse Intake. Advised patient go to MAU for evaluation of dehydration. Pt voices understanding and states her boyfriend will take her there now.

## 2020-05-20 ENCOUNTER — Ambulatory Visit (INDEPENDENT_AMBULATORY_CARE_PROVIDER_SITE_OTHER): Payer: Medicaid Other

## 2020-05-20 DIAGNOSIS — O0990 Supervision of high risk pregnancy, unspecified, unspecified trimester: Secondary | ICD-10-CM | POA: Insufficient documentation

## 2020-05-20 NOTE — Progress Notes (Signed)
PRENATAL INTAKE SUMMARY  Ms. Hussar presents today New OB Nurse Interview.  OB History    Gravida  4   Para  3   Term  3   Preterm      AB      Living  3     SAB      TAB      Ectopic      Multiple  0   Live Births  3          I have reviewed the patient's medical, obstetrical, social, and family histories, medications, and available lab results.  SUBJECTIVE She has no unusual complaints  OBJECTIVE Initial Nurse Intake Interview (New OB)  GENERAL APPEARANCE: oriented to person, place and time   ASSESSMENT Normal pregnancy  PLAN Prenatal care at [facility] Already has BP Cuff  PHQ-9=4 Pregnancy Risk Screening done Prenatal labs will be done at [facility] visit 05/27/20

## 2020-05-27 ENCOUNTER — Other Ambulatory Visit: Payer: Self-pay

## 2020-05-27 ENCOUNTER — Encounter: Payer: Self-pay | Admitting: Advanced Practice Midwife

## 2020-05-27 ENCOUNTER — Ambulatory Visit (INDEPENDENT_AMBULATORY_CARE_PROVIDER_SITE_OTHER): Payer: Medicaid Other | Admitting: Advanced Practice Midwife

## 2020-05-27 VITALS — BP 104/69 | HR 76 | Wt 176.0 lb

## 2020-05-27 DIAGNOSIS — O99211 Obesity complicating pregnancy, first trimester: Secondary | ICD-10-CM

## 2020-05-27 DIAGNOSIS — F1290 Cannabis use, unspecified, uncomplicated: Secondary | ICD-10-CM

## 2020-05-27 DIAGNOSIS — Z3A10 10 weeks gestation of pregnancy: Secondary | ICD-10-CM | POA: Diagnosis not present

## 2020-05-27 DIAGNOSIS — Z98891 History of uterine scar from previous surgery: Secondary | ICD-10-CM

## 2020-05-27 DIAGNOSIS — O0990 Supervision of high risk pregnancy, unspecified, unspecified trimester: Secondary | ICD-10-CM

## 2020-05-27 DIAGNOSIS — O99213 Obesity complicating pregnancy, third trimester: Secondary | ICD-10-CM | POA: Insufficient documentation

## 2020-05-27 DIAGNOSIS — Z3481 Encounter for supervision of other normal pregnancy, first trimester: Secondary | ICD-10-CM

## 2020-05-27 DIAGNOSIS — Z315 Encounter for genetic counseling: Secondary | ICD-10-CM | POA: Diagnosis not present

## 2020-05-27 DIAGNOSIS — O99210 Obesity complicating pregnancy, unspecified trimester: Secondary | ICD-10-CM

## 2020-05-27 DIAGNOSIS — E6609 Other obesity due to excess calories: Secondary | ICD-10-CM

## 2020-05-27 MED ORDER — ASPIRIN EC 81 MG PO TBEC: 81.0000 mg | DELAYED_RELEASE_TABLET | Freq: Every day | ORAL | 2 refills | Status: DC

## 2020-05-27 NOTE — Patient Instructions (Signed)
First Trimester of Pregnancy  The first trimester of pregnancy is from week 1 until the end of week 13 (months 1 through 3). During this time, your baby will begin to develop inside you. At 6-8 weeks, the eyes and face are formed, and the heartbeat can be seen on ultrasound. At the end of 12 weeks, all the baby's organs are formed. Prenatal care is all the medical care you receive before the birth of your baby. Make sure you get good prenatal care and follow all of your doctor's instructions. Follow these instructions at home: Medicines  Take over-the-counter and prescription medicines only as told by your doctor. Some medicines are safe and some medicines are not safe during pregnancy.  Take a prenatal vitamin that contains at least 600 micrograms (mcg) of folic acid.  If you have trouble pooping (constipation), take medicine that will make your stool soft (stool softener) if your doctor approves. Eating and drinking   Eat regular, healthy meals.  Your doctor will tell you the amount of weight gain that is right for you.  Avoid raw meat and uncooked cheese.  If you feel sick to your stomach (nauseous) or throw up (vomit): ? Eat 4 or 5 small meals a day instead of 3 large meals. ? Try eating a few soda crackers. ? Drink liquids between meals instead of during meals.  To prevent constipation: ? Eat foods that are high in fiber, like fresh fruits and vegetables, whole grains, and beans. ? Drink enough fluids to keep your pee (urine) clear or pale yellow. Activity  Exercise only as told by your doctor. Stop exercising if you have cramps or pain in your lower belly (abdomen) or low back.  Do not exercise if it is too hot, too humid, or if you are in a place of great height (high altitude).  Try to avoid standing for long periods of time. Move your legs often if you must stand in one place for a long time.  Avoid heavy lifting.  Wear low-heeled shoes. Sit and stand up  straight.  You can have sex unless your doctor tells you not to. Relieving pain and discomfort  Wear a good support bra if your breasts are sore.  Take warm water baths (sitz baths) to soothe pain or discomfort caused by hemorrhoids. Use hemorrhoid cream if your doctor says it is okay.  Rest with your legs raised if you have leg cramps or low back pain.  If you have puffy, bulging veins (varicose veins) in your legs: ? Wear support hose or compression stockings as told by your doctor. ? Raise (elevate) your feet for 15 minutes, 3-4 times a day. ? Limit salt in your food. Prenatal care  Schedule your prenatal visits by the twelfth week of pregnancy.  Write down your questions. Take them to your prenatal visits.  Keep all your prenatal visits as told by your doctor. This is important. Safety  Wear your seat belt at all times when driving.  Make a list of emergency phone numbers. The list should include numbers for family, friends, the hospital, and police and fire departments. General instructions  Ask your doctor for a referral to a local prenatal class. Begin classes no later than at the start of month 6 of your pregnancy.  Ask for help if you need counseling or if you need help with nutrition. Your doctor can give you advice or tell you where to go for help.  Do not use hot tubs, steam   rooms, or saunas.  Do not douche or use tampons or scented sanitary pads.  Do not cross your legs for long periods of time.  Avoid all herbs and alcohol. Avoid drugs that are not approved by your doctor.  Do not use any tobacco products, including cigarettes, chewing tobacco, and electronic cigarettes. If you need help quitting, ask your doctor. You may get counseling or other support to help you quit.  Avoid cat litter boxes and soil used by cats. These carry germs that can cause birth defects in the baby and can cause a loss of your baby (miscarriage) or stillbirth.  Visit your dentist.  At home, brush your teeth with a soft toothbrush. Be gentle when you floss. Contact a doctor if:  You are dizzy.  You have mild cramps or pressure in your lower belly.  You have a nagging pain in your belly area.  You continue to feel sick to your stomach, you throw up, or you have watery poop (diarrhea).  You have a bad smelling fluid coming from your vagina.  You have pain when you pee (urinate).  You have increased puffiness (swelling) in your face, hands, legs, or ankles. Get help right away if:  You have a fever.  You are leaking fluid from your vagina.  You have spotting or bleeding from your vagina.  You have very bad belly cramping or pain.  You gain or lose weight rapidly.  You throw up blood. It may look like coffee grounds.  You are around people who have German measles, fifth disease, or chickenpox.  You have a very bad headache.  You have shortness of breath.  You have any kind of trauma, such as from a fall or a car accident. Summary  The first trimester of pregnancy is from week 1 until the end of week 13 (months 1 through 3).  To take care of yourself and your unborn baby, you will need to eat healthy meals, take medicines only if your doctor tells you to do so, and do activities that are safe for you and your baby.  Keep all follow-up visits as told by your doctor. This is important as your doctor will have to ensure that your baby is healthy and growing well. This information is not intended to replace advice given to you by your health care provider. Make sure you discuss any questions you have with your health care provider. Document Revised: 04/05/2019 Document Reviewed: 12/21/2016 Elsevier Patient Education  2020 [employer] Inc.  

## 2020-05-27 NOTE — Progress Notes (Signed)
History:   Claudia Miller is a 29 y.o. [phone] at [redacted]w[redacted]d by LMP being seen today for her first obstetrical visit.  Her obstetrical history is significant for primary cesarean due to [place] and presence of thick meconium.. Patient does intend to breast feed. Pregnancy history fully reviewed.  Patient reports no complaints. She is feeling better than when she was seen in MAU 04/15/2020.      HISTORY: OB History  Gravida Para Term Preterm AB Living  4 3 3  0 0 3  SAB TAB Ectopic Multiple Live Births  0 0 0 0 3    # Outcome Date GA Lbr Len/2nd Weight Sex Delivery Anes PTL Lv  4 Current           3 Term 01/23/19 [redacted]w[redacted]d  6 lb 4.4 oz (2.845 kg) F CS-LTranv EPI  LIV     Name: Coss,GIRL Jinnie     Apgar1: 9  Apgar5: 9  2 Term 07/27/14 [redacted]w[redacted]d 14:10 / 00:25 6 lb 0.8 oz (2.745 kg) M Vag-Spont EPI  LIV     Name: Fosco,BOY Teghan     Apgar1: 9  Apgar5: 9  1 Term     F Vag-Spont EPI N LIV    Last pap smear was done 06/2018 and was normal  Past Medical History:  Diagnosis Date  . Anemia   . Supervision of normal pregnancy, antepartum 07/12/2018    Nursing Staff Provider Office Location  [facility] Dating   [place] 6.4 on 06/04/18 Language  English  Anatomy US  Normal Flu Vaccine   08/15/18 Genetic Screen  NIPS: normal  TDaP vaccine   11/30/18 Hgb A1C or  GTT Early  Third trimester nl 2 hour  Rhogam   n/a   LAB RESULTS  Feeding Plan Bottle Blood Type A/Positive/-- (07/17 1211)  Contraception Depo Antibody Negative (07/17 1211) Circumcision  girl  [place]   Past Surgical History:  Procedure Laterality Date  . CESAREAN SECTION N/A 01/23/2019   Procedure: CESAREAN SECTION;  Surgeon: Osborne Oman, MD;  Location: [facility];  Service: Obstetrics;  Laterality: N/A;  . NO PAST SURGERIES     Family History  Problem Relation Age of Onset  . Asthma Mother   . Diabetes Mother   . Hypertension Mother   . Cancer Father   . Hypertension Father   . Asthma Sister   . Hypertension Sister   . Asthma  Brother   . Hypertension Brother    Social History   Tobacco Use  . Smoking status: Former Smoker    Packs/day: 0.00    Types: Cigarettes    Quit date: 05/31/2018    Years since quitting: 1.9  . Smokeless tobacco: Never Used  Substance Use Topics  . Alcohol use: No  . Drug use: No   Allergies  Allergen Reactions  . Fish Allergy Anaphylaxis   Current Outpatient Medications on File Prior to Visit  Medication Sig Dispense Refill  . Doxylamine-Pyridoxine (DICLEGIS) 10-10 MG TBEC Take 2 tablets by mouth at bedtime. If symptoms persist, add one tablet in the morning and one in the afternoon 100 tablet 5  . Prenatal Vit-Fe Fumarate-FA (PREPLUS) 27-1 MG TABS Take 1 tablet by mouth daily. 30 tablet 13  . promethazine (PHENERGAN) 25 MG tablet Take 1 tablet (25 mg total) by mouth every 6 (six) hours as needed for nausea or vomiting. (Patient not taking: Reported on 05/27/2020) 30 tablet 0   No current facility-administered medications on file prior to  visit.    Review of Systems Pertinent items noted in HPI and remainder of comprehensive ROS otherwise negative. Physical Exam:   Vitals:   05/27/20 1310  BP: 104/69  Pulse: 76  Weight: 176 lb (79.8 kg)     Bedside Ultrasound for FHR check: Viable intrauterine pregnancy with positive cardiac activity note --Heartrate 150 bpm Patient informed that the ultrasound is considered a limited obstetric ultrasound and is not intended to be a complete ultrasound exam.  Patient also informed that the ultrasound is not being completed with the intent of assessing for fetal or placental anomalies or any pelvic abnormalities.  Explained that the purpose of today's ultrasound is to assess for fetal heart rate.  Patient acknowledges the purpose of the exam and the limitations of the study. System: General: well-developed, well-nourished female in no acute distress   Breasts:  normal appearance, no masses or tenderness bilaterally   Skin: normal coloration  and turgor, no rashes   Neurologic: oriented, normal, negative, normal mood   Extremities: normal strength, tone, and muscle mass, ROM of all joints is normal   HEENT PERRLA, extraocular movement intact and sclera clear, anicteric   Mouth/Teeth mucous membranes moist, pharynx normal without lesions and dental hygiene good   Neck supple and no masses   Cardiovascular: regular rate and rhythm   Respiratory:  no respiratory distress, normal breath sounds   Abdomen: soft, non-tender; bowel sounds normal; no masses,  no organomegaly    Assessment:    Pregnancy: [id number] Patient Active Problem List   Diagnosis Date Noted  . Obesity affecting pregnancy in first trimester 05/27/2020  . Supervision of high risk pregnancy, antepartum 05/20/2020  . Marijuana use 04/15/2020  . Acute blood loss as cause of postoperative anemia 01/24/2019  . S/P cesarean section 01/23/2019  . PROM (premature rupture of membranes) 01/22/2019  . Hematuria 08/15/2018  . Trichomoniasis 08/09/2018   Indications for ASA therapy (per uptodate)  Two or more of the following: Nulliparity No Obesity (body mass index >30 kg/m2) Yes Family history of preeclampsia in mother or sister No Age ?35 years No Sociodemographic characteristics (African American race, low socioeconomic level) Yes Personal risk factors (eg, previous pregnancy with low birth weight or small for gestational age infant, previous adverse pregnancy outcome [eg, stillbirth], interval >10 years between pregnancies) No  Plan:    1. Encounter for supervision of other normal pregnancy in first trimester - Welcomed back to practice - Babyscripts Schedule Optimization - Cervicovaginal ancillary only( [place]) - Culture, OB Urine - Genetic Screening - US MFM OB COMP + 14 WK; Future - Pelvic and cervicovaginal swab declined by patient  - Enroll Patient in Babyscripts - Obstetric Panel, Including HIV  2. Hx of cesarean section - Desires TOLAC - Op  note reviewed: LTCS  3. Obesity affecting pregnancy in first trimester - Normal CBC and CMP from MAU 04/15/2020 - Hemoglobin A1c  Initial labs drawn. Continue prenatal vitamins. Problem list reviewed and updated. Genetic Screening discussed, First trimester screen, Quad screen and NIPS: ordered. Ultrasound discussed; fetal anatomic survey: ordered. Discussed usage of Babyscripts and virtual visits as additional source of managing and completing prenatal visits in midst of coronavirus and pandemic.   Anticipatory guidance for prenatal visits including labs, ultrasounds, and testing; Initial labs drawn. Encouraged to complete MyChart Registration for her ability to review results, send requests, and have questions addressed.  The nature of [place] - Center for [facility] Healthcare/Faculty Practice with multiple MDs and Advanced Practice Providers  was explained to patient; also emphasized that residents, students are part of our team. Routine obstetric precautions reviewed. Encouraged to seek out care at office or emergency room [facility]U preferred) for urgent and/or emergent concerns. Return in about 4 weeks (around 06/24/2020) for Virtual.     Clayton Bibles, MSN, CNM Certified Nurse Midwife, [facility] for [facility], [facility] 05/27/20 1:40 PM

## 2020-05-28 LAB — HEMOGLOBIN A1C
Est. average glucose Bld gHb Est-mCnc: 103 mg/dL
Hgb A1c MFr Bld: 5.2 % (ref 4.8–5.6)

## 2020-05-28 LAB — OBSTETRIC PANEL, INCLUDING HIV
Antibody Screen: NEGATIVE
Basophils Absolute: 0 10*3/uL (ref 0.0–0.2)
Basos: 0 %
EOS (ABSOLUTE): 0 10*3/uL (ref 0.0–0.4)
Eos: 0 %
HIV Screen 4th Generation wRfx: NONREACTIVE
Hematocrit: 33 % — ABNORMAL LOW (ref 34.0–46.6)
Hemoglobin: 11.3 g/dL (ref 11.1–15.9)
Hepatitis B Surface Ag: NEGATIVE
Immature Grans (Abs): 0 10*3/uL (ref 0.0–0.1)
Immature Granulocytes: 0 %
Lymphocytes Absolute: 1.3 10*3/uL (ref 0.7–3.1)
Lymphs: 20 %
MCH: 30.7 pg (ref 26.6–33.0)
MCHC: 34.2 g/dL (ref 31.5–35.7)
MCV: 90 fL (ref 79–97)
Monocytes Absolute: 0.5 10*3/uL (ref 0.1–0.9)
Monocytes: 8 %
Neutrophils Absolute: 4.5 10*3/uL (ref 1.4–7.0)
Neutrophils: 72 %
Platelets: 229 10*3/uL (ref 150–450)
RBC: 3.68 x10E6/uL — ABNORMAL LOW (ref 3.77–5.28)
RDW: 11.4 % — ABNORMAL LOW (ref 11.7–15.4)
RPR Ser Ql: NONREACTIVE
Rh Factor: POSITIVE
Rubella Antibodies, IGG: 6.46 index (ref >0.99)
WBC: 6.3 10*3/uL (ref 3.4–10.8)

## 2020-05-29 ENCOUNTER — Encounter: Payer: Medicaid Other | Admitting: Family Medicine

## 2020-05-29 LAB — URINE CULTURE, OB REFLEX

## 2020-05-29 LAB — CULTURE, OB URINE

## 2020-06-02 ENCOUNTER — Encounter: Payer: Self-pay | Admitting: Advanced Practice Midwife

## 2020-06-24 ENCOUNTER — Encounter: Payer: Self-pay | Admitting: Obstetrics

## 2020-06-24 ENCOUNTER — Telehealth (INDEPENDENT_AMBULATORY_CARE_PROVIDER_SITE_OTHER): Payer: Medicaid Other | Admitting: Obstetrics

## 2020-06-24 VITALS — BP 94/55

## 2020-06-24 DIAGNOSIS — O99210 Obesity complicating pregnancy, unspecified trimester: Secondary | ICD-10-CM

## 2020-06-24 DIAGNOSIS — Z3480 Encounter for supervision of other normal pregnancy, unspecified trimester: Secondary | ICD-10-CM

## 2020-06-24 DIAGNOSIS — E669 Obesity, unspecified: Secondary | ICD-10-CM

## 2020-06-24 DIAGNOSIS — Z98891 History of uterine scar from previous surgery: Secondary | ICD-10-CM

## 2020-06-24 DIAGNOSIS — Z3A14 14 weeks gestation of pregnancy: Secondary | ICD-10-CM

## 2020-06-24 DIAGNOSIS — R12 Heartburn: Secondary | ICD-10-CM

## 2020-06-24 DIAGNOSIS — O34212 Maternal care for vertical scar from previous cesarean delivery: Secondary | ICD-10-CM

## 2020-06-24 DIAGNOSIS — O26892 Other specified pregnancy related conditions, second trimester: Secondary | ICD-10-CM

## 2020-06-24 DIAGNOSIS — O99212 Obesity complicating pregnancy, second trimester: Secondary | ICD-10-CM

## 2020-06-24 DIAGNOSIS — O26899 Other specified pregnancy related conditions, unspecified trimester: Secondary | ICD-10-CM

## 2020-06-24 MED ORDER — OMEPRAZOLE 20 MG PO CPDR: 20.0000 mg | DELAYED_RELEASE_CAPSULE | Freq: Two times a day (BID) | ORAL | 5 refills | Status: DC

## 2020-06-24 NOTE — Progress Notes (Signed)
Pt is on the phone preparing for virtual visit with provider, [redacted]w[redacted]d.

## 2020-06-24 NOTE — Progress Notes (Signed)
   OBSTETRICS PRENATAL VIRTUAL VISIT ENCOUNTER NOTE  Provider location: Center for [facility] at [place]   I connected with Wynonia Lawman on 06/24/20 at 10:15 AM EDT by MyChart Video Encounter at home and verified that I am speaking with the correct person using two identifiers.   I discussed the limitations, risks, security and privacy concerns of performing an evaluation and management service virtually and the availability of in person appointments. I also discussed with the patient that there may be a patient responsible charge related to this service. The patient expressed understanding and agreed to proceed. Subjective:  Claudia Miller is a 29 y.o. [phone] at [redacted]w[redacted]d being seen today for ongoing prenatal care.  She is currently monitored for the following issues for this low-risk pregnancy and has Trichomoniasis; Hematuria; PROM (premature rupture of membranes); S/P cesarean section; Acute blood loss as cause of postoperative anemia; Marijuana use; Supervision of high risk pregnancy, antepartum; and Obesity affecting pregnancy in first trimester on their problem list.  Patient reports heartburn and occasional chest tightness.  Contractions: Not present. Vag. Bleeding: None.  Movement: Present. Denies any leaking of fluid.   The following portions of the patient's history were reviewed and updated as appropriate: allergies, current medications, past family history, past medical history, past social history, past surgical history and problem list.   Objective:   Vitals:   06/24/20 1003  BP: (!) 94/55    Fetal Status:     Movement: Present     General:  Alert, oriented and cooperative. Patient is in no acute distress.  Respiratory: Normal respiratory effort, no problems with respiration noted  Mental Status: Normal mood and affect. Normal behavior. Normal judgment and thought content.  Rest of physical exam deferred due to type of encounter  Imaging: No results  found.  Assessment and Plan:  Pregnancy: G4P3003 at [redacted]w[redacted]d  1. Encounter for supervision of normal intrauterine pregnancy in multigravida, antepartum  2. Hx of cesarean section  3. Obesity in pregnancy  4. Heartburn during pregnancy, antepartum Rx: - omeprazole (PRILOSEC) 20 MG capsule; Take 1 capsule (20 mg total) by mouth 2 (two) times daily before a meal.  Dispense: 60 capsule; Refill: 5   Preterm labor symptoms and general obstetric precautions including but not limited to vaginal bleeding, contractions, leaking of fluid and fetal movement were reviewed in detail with the patient. I discussed the assessment and treatment plan with the patient. The patient was provided an opportunity to ask questions and all were answered. The patient agreed with the plan and demonstrated an understanding of the instructions. The patient was advised to call back or seek an in-person office evaluation/go to [facility] at [facility] for any urgent or concerning symptoms. Please refer to After Visit Summary for other counseling recommendations.   I provided 10 minutes of face-to-face time during this encounter.  Return in about 4 weeks (around 07/22/2020) for MyChart.  Future Appointments  Date Time Provider Department Center  07/29/2020  9:45 AM [facility] US4 [facility]    Coral Ceo, MD Center for [facility], [facility] 06/24/20

## 2020-07-07 DIAGNOSIS — Z5181 Encounter for therapeutic drug level monitoring: Secondary | ICD-10-CM | POA: Diagnosis not present

## 2020-07-22 ENCOUNTER — Telehealth (INDEPENDENT_AMBULATORY_CARE_PROVIDER_SITE_OTHER): Payer: Medicaid Other

## 2020-07-22 VITALS — BP 102/64

## 2020-07-22 DIAGNOSIS — Z3A18 18 weeks gestation of pregnancy: Secondary | ICD-10-CM

## 2020-07-22 DIAGNOSIS — O0992 Supervision of high risk pregnancy, unspecified, second trimester: Secondary | ICD-10-CM

## 2020-07-22 DIAGNOSIS — R0989 Other specified symptoms and signs involving the circulatory and respiratory systems: Secondary | ICD-10-CM

## 2020-07-22 DIAGNOSIS — R067 Sneezing: Secondary | ICD-10-CM

## 2020-07-22 DIAGNOSIS — T699XXA Effect of reduced temperature, unspecified, initial encounter: Secondary | ICD-10-CM

## 2020-07-22 DIAGNOSIS — E669 Obesity, unspecified: Secondary | ICD-10-CM

## 2020-07-22 DIAGNOSIS — O99322 Drug use complicating pregnancy, second trimester: Secondary | ICD-10-CM

## 2020-07-22 DIAGNOSIS — O0990 Supervision of high risk pregnancy, unspecified, unspecified trimester: Secondary | ICD-10-CM

## 2020-07-22 DIAGNOSIS — F1290 Cannabis use, unspecified, uncomplicated: Secondary | ICD-10-CM

## 2020-07-22 DIAGNOSIS — O99212 Obesity complicating pregnancy, second trimester: Secondary | ICD-10-CM

## 2020-07-22 NOTE — Progress Notes (Signed)
I connected with Claudia Miller 07/22/20 at 10:55 AM EDT by: MyChart video and verified that I am speaking with the correct person using two identifiers.  Patient is located at Home and provider is located at [facility].     The purpose of this virtual visit is to provide medical care while limiting exposure to the novel coronavirus. I discussed the limitations, risks, security and privacy concerns of performing an evaluation and management service by MyChart video and the availability of in person appointments. I also discussed with the patient that there may be a patient responsible charge related to this service. By engaging in this virtual visit, you consent to the provision of healthcare.  Additionally, you authorize for your insurance to be billed for the services provided during this visit.  The patient expressed understanding and agreed to proceed.  The following staff members participated in the virtual visit:  J.Corine Solorio, CNM    PRENATAL VISIT NOTE  Subjective:  Claudia Miller is a 29 y.o. [phone] at [redacted]w[redacted]d  for phone visit for ongoing prenatal care.  She is currently monitored for the following issues for this low-risk pregnancy and has Trichomoniasis; Hematuria; PROM (premature rupture of membranes); S/P cesarean section; Acute blood loss as cause of postoperative anemia; Marijuana use; Supervision of high risk pregnancy, antepartum; and Obesity affecting pregnancy in first trimester on their problem list.  Patient reports cold. She states she has been staying home and resting.  She reports runny nose and sneezing. She reports she has not been around any sick individuals and does not work outside the home.  She reports her symptoms started "a couple of days ago."  Contractions: Not present. Vag. Bleeding: None.  Movement: Present. Denies leaking of fluid.   The following portions of the patient's history were reviewed and updated as appropriate: allergies, current medications, past family  history, past medical history, past social history, past surgical history and problem list.   Objective:   Vitals:   07/22/20 0905  BP: (!) 102/64   Self-Obtained  Fetal Status:     Movement: Present     Assessment and Plan:  Pregnancy: G4P3003 at [redacted]w[redacted]d 1. Supervision of high risk pregnancy, antepartum -Anticipatory guidance for upcoming visits. -Informed of need for AFP prior to 23 weeks. -Will plan to come to office in 3 weeks to obtain test unless ill. -Plan to meet with MD to discuss TOLAC at that time as well.   2. Cold exposure, initial encounter -Reviewed cold remedies including rest and hydration. -Encouraged to consider Covid testing with the recent rise in cases. -Mychart message sent with pregnancy safe medications list.   Preterm labor symptoms and general obstetric precautions including but not limited to vaginal bleeding, contractions, leaking of fluid and fetal movement were reviewed in detail with the patient.  No follow-ups on file.  Future Appointments  Date Time Provider Department Center  07/22/2020 10:55 AM Gerrit Heck, CNM [facility] None  07/29/2020  9:45 AM [facility] US4 [facility]     Time spent on virtual visit: 5 minutes  Cherre Robins, CNM

## 2020-07-22 NOTE — Patient Instructions (Signed)

## 2020-07-22 NOTE — Progress Notes (Signed)
I connected with  Claudia Miller on 07/22/20 by a video enabled telemedicine application and verified that I am speaking with the correct person using two identifiers.   I discussed the limitations of evaluation and management by telemedicine. The patient expressed understanding and agreed to proceed.   MyChart OB, reports no problem today.  Her BP Cuff is giving her an error message.  I told patient to bring the BP cuff with her to the next in person appt.

## 2020-07-29 ENCOUNTER — Other Ambulatory Visit: Payer: Self-pay | Admitting: *Deleted

## 2020-07-29 ENCOUNTER — Other Ambulatory Visit: Payer: Self-pay

## 2020-07-29 ENCOUNTER — Other Ambulatory Visit: Payer: Self-pay | Admitting: Advanced Practice Midwife

## 2020-07-29 ENCOUNTER — Ambulatory Visit: Payer: Medicaid Other | Attending: Obstetrics and Gynecology

## 2020-07-29 DIAGNOSIS — Z363 Encounter for antenatal screening for malformations: Secondary | ICD-10-CM | POA: Diagnosis not present

## 2020-07-29 DIAGNOSIS — Z3A21 21 weeks gestation of pregnancy: Secondary | ICD-10-CM

## 2020-07-29 DIAGNOSIS — Z3481 Encounter for supervision of other normal pregnancy, first trimester: Secondary | ICD-10-CM

## 2020-07-29 DIAGNOSIS — O34219 Maternal care for unspecified type scar from previous cesarean delivery: Secondary | ICD-10-CM

## 2020-07-29 DIAGNOSIS — O321XX0 Maternal care for breech presentation, not applicable or unspecified: Secondary | ICD-10-CM

## 2020-07-29 DIAGNOSIS — Z362 Encounter for other antenatal screening follow-up: Secondary | ICD-10-CM

## 2020-08-12 ENCOUNTER — Encounter: Payer: Medicaid Other | Admitting: Obstetrics and Gynecology

## 2020-08-27 ENCOUNTER — Encounter: Payer: Medicaid Other | Admitting: Obstetrics and Gynecology

## 2020-09-09 ENCOUNTER — Ambulatory Visit: Payer: Medicaid Other

## 2020-09-09 ENCOUNTER — Ambulatory Visit: Payer: Medicaid Other | Attending: Obstetrics and Gynecology

## 2020-09-23 ENCOUNTER — Ambulatory Visit (INDEPENDENT_AMBULATORY_CARE_PROVIDER_SITE_OTHER): Payer: Medicaid Other | Admitting: Obstetrics and Gynecology

## 2020-09-23 ENCOUNTER — Other Ambulatory Visit: Payer: Self-pay

## 2020-09-23 ENCOUNTER — Encounter: Payer: Self-pay | Admitting: Obstetrics and Gynecology

## 2020-09-23 VITALS — BP 114/74 | HR 88 | Wt 205.7 lb

## 2020-09-23 DIAGNOSIS — O0990 Supervision of high risk pregnancy, unspecified, unspecified trimester: Secondary | ICD-10-CM

## 2020-09-23 DIAGNOSIS — O99213 Obesity complicating pregnancy, third trimester: Secondary | ICD-10-CM

## 2020-09-23 DIAGNOSIS — O34219 Maternal care for unspecified type scar from previous cesarean delivery: Secondary | ICD-10-CM

## 2020-09-23 NOTE — Progress Notes (Signed)
   PRENATAL VISIT NOTE  Subjective:  Claudia Miller is a 29 y.o. [phone] at [redacted]w[redacted]d being seen today for ongoing prenatal care.  She is currently monitored for the following issues for this high-risk pregnancy and has History of cesarean delivery, currently pregnant; Supervision of high risk pregnancy, antepartum; and Obesity affecting pregnancy in third trimester, antepartum on their problem list.  Patient reports no complaints.  Contractions: Not present. Vag. Bleeding: None.  Movement: Present. Denies leaking of fluid.   The following portions of the patient's history were reviewed and updated as appropriate: allergies, current medications, past family history, past medical history, past social history, past surgical history and problem list.   Objective:   Vitals:   09/23/20 0954  BP: 114/74  Pulse: 88  Weight: 205 lb 11.2 oz (93.3 kg)    Fetal Status: Fetal Heart Rate (bpm): 138 Fundal Height: 29 cm Movement: Present     General:  Alert, oriented and cooperative. Patient is in no acute distress.  Skin: Skin is warm and dry. No rash noted.   Cardiovascular: Normal heart rate noted  Respiratory: Normal respiratory effort, no problems with respiration noted  Abdomen: Soft, gravid, appropriate for gestational age.  Pain/Pressure: Present     Pelvic: Cervical exam deferred        Extremities: Normal range of motion.  Edema: Trace  Mental Status: Normal mood and affect. Normal behavior. Normal judgment and thought content.   Assessment and Plan:  Pregnancy: G4P3003 at [redacted]w[redacted]d 1. Supervision of high risk pregnancy, antepartum - Patient for 2hr GTT with additional 28 week labs tomorrow  2. History of cesarean delivery, currently pregnant - Previous 2 NSVDs then c/section for non-reassuring fetal heart tones. - Patient is a good candidate for TOLAC/VBAC.  - Risks and benefits discussed. - Patient desires TOLAC.   - Consent signed.  3. Obesity affecting pregnancy in third  trimester, antepartum - Daily ASA recommended.  Preterm labor symptoms and general obstetric precautions including but not limited to vaginal bleeding, contractions, leaking of fluid and fetal movement were reviewed in detail with the patient. Please refer to After Visit Summary for other counseling recommendations.   Return in about 2 weeks (around 10/07/2020) for HROB.  Future Appointments  Date Time Provider Department Center  09/26/2020  9:15 AM [facility] None  10/07/2020  4:00 PM Johnny Bridge, MD [facility] None    Johnny Bridge, MD

## 2020-09-23 NOTE — Progress Notes (Signed)
Pt reports fetal movement with some back pain. 

## 2020-09-23 NOTE — Patient Instructions (Signed)
Preparing for Vaginal Birth After Cesarean Delivery Vaginal birth after cesarean delivery (VBAC) is giving birth vaginally after previously delivering a baby through a cesarean section (C-section). You and your health are provider will discuss your options and whether you may be a good candidate for VBAC. What are my options? After a cesarean delivery, your options for future deliveries may include:  Scheduled repeat cesarean delivery. This is done in a hospital with an operating room.  Trial of labor after cesarean (TOLAC). A successful TOLAC results in a vaginal delivery. If it is not successful, you will need to have a cesarean delivery. TOLAC should be attempted in facilities where an emergency cesarean delivery can be performed. It should not be done as a home birth. Talk with your health care provider about the risks and benefits of each option early in your pregnancy. The best option for you will depend on your preferences and your overall health as well as your baby's. What should I know about my past cesarean delivery? It is important to know what type of incision was made in your uterus in a past cesarean delivery. The type of incision can affect the success of your TOLAC. Types of incisions include:  Low transverse. This is a side-to-side cut low on your uterus. The scar on your skin looks like a horizontal line just above your pubic area. This type of cut is the most common and makes you a good candidate for TOLAC.  Low vertical. This is an up-and-down cut low on your uterus. The scar on your skin looks like a vertical line between your pubic area and belly button. This type of cut puts you at higher risk for problems during TOLAC.  High vertical or classical. This is an up-and-down cut high on your uterus. The scar on your skin looks like a vertical line that runs over the top of your belly button. This type of cut has the highest risk for problems and usually means that TOLAC is not an  option. When is VBAC not an option? As you progress through your pregnancy, circumstances may change and you may need to reconsider your options. Your situation may also change even as you begin TOLAC. Your health care provider may not want you to attempt a VBAC if you:  Need to have labor started (induced) because your cervix is not ready for labor.  Have never had a vaginal delivery.  Have had more than two cesarean deliveries.  Are overdue.  Are pregnant with a very large baby.  Have a condition that causes high blood pressure (preeclampsia). Questions to ask your health care provider  Am I a good candidate for TOLAC?  What are my chances of a successful vaginal delivery?  Is my preferred birth location equipped for a TOLAC?  What are my pain management options during a TOLAC? Where to find more information  American Congress of Obstetricians and Gynecologists: www.acog.org  American College of Nurse-Midwives: www.midwife.org Summary  Vaginal birth after cesarean delivery (VBAC) is giving birth vaginally after previously delivering a baby through a cesarean section (C-section).  VBAC may be a safe and appropriate option for you depending on your medical history and other risk factors. Talk with your health care provider about the options available to you, and the risks and benefits of each early in your pregnancy.  TOLAC should be attempted in facilities where emergency cesarean section procedures can be performed. This information is not intended to replace advice given to you by   your health care provider. Make sure you discuss any questions you have with your health care provider. Document Revised: 04/10/2019 Document Reviewed: 03/24/2017 Elsevier Patient Education  2020 Elsevier Inc.  

## 2020-09-26 ENCOUNTER — Other Ambulatory Visit: Payer: Medicaid Other

## 2020-10-03 ENCOUNTER — Other Ambulatory Visit: Payer: Medicaid Other

## 2020-10-07 ENCOUNTER — Encounter: Payer: Self-pay | Admitting: Obstetrics and Gynecology

## 2020-10-07 ENCOUNTER — Other Ambulatory Visit: Payer: Self-pay

## 2020-10-07 ENCOUNTER — Ambulatory Visit (INDEPENDENT_AMBULATORY_CARE_PROVIDER_SITE_OTHER): Payer: Medicaid Other | Admitting: Obstetrics and Gynecology

## 2020-10-07 VITALS — BP 118/81 | HR 87 | Wt 202.0 lb

## 2020-10-07 DIAGNOSIS — O99213 Obesity complicating pregnancy, third trimester: Secondary | ICD-10-CM

## 2020-10-07 DIAGNOSIS — Z23 Encounter for immunization: Secondary | ICD-10-CM | POA: Diagnosis not present

## 2020-10-07 DIAGNOSIS — O34219 Maternal care for unspecified type scar from previous cesarean delivery: Secondary | ICD-10-CM

## 2020-10-07 DIAGNOSIS — O0990 Supervision of high risk pregnancy, unspecified, unspecified trimester: Secondary | ICD-10-CM

## 2020-10-07 NOTE — Progress Notes (Signed)
Pt is doing well today. Pt states she missed last u/s appt, ? Need to reschedule. Pt is scheduled for GTT on 10/09/20.

## 2020-10-07 NOTE — Progress Notes (Signed)
2 PRENATAL VISIT NOTE  Subjective:  Claudia Miller is a 29 y.o. [phone] at [redacted]w[redacted]d being seen today for ongoing prenatal care.  She is currently monitored for the following issues for this high-risk pregnancy and has History of cesarean delivery, currently pregnant; Supervision of high risk pregnancy, antepartum; and Obesity affecting pregnancy in third trimester, antepartum on their problem list.  Patient reports no complaints.  Contractions: Not present. Vag. Bleeding: None.  Movement: Present. Denies leaking of fluid.   The following portions of the patient's history were reviewed and updated as appropriate: allergies, current medications, past family history, past medical history, past social history, past surgical history and problem list.   Objective:   Vitals:   10/07/20 1606  BP: 118/81  Pulse: 87  Weight: 202 lb (91.6 kg)    Fetal Status: Fetal Heart Rate (bpm): 130 Fundal Height: 31 cm Movement: Present     General:  Alert, oriented and cooperative. Patient is in no acute distress.  Skin: Skin is warm and dry. No rash noted.   Cardiovascular: Normal heart rate noted  Respiratory: Normal respiratory effort, no problems with respiration noted  Abdomen: Soft, gravid, appropriate for gestational age.  Pain/Pressure: Present     Pelvic: Cervical exam deferred        Extremities: Normal range of motion.  Edema: None  Mental Status: Normal mood and affect. Normal behavior. Normal judgment and thought content.   Assessment and Plan:  Pregnancy: G4P3003 at [redacted]w[redacted]d 1. Supervision of high risk pregnancy, antepartum - Patient for flu shot today. - Patient is rescheduled for 2hr GTT this Thursday.  2. History of cesarean delivery, currently pregnant - Patient for TOLAC  3. Obesity affecting pregnancy in third trimester, antepartum - Continue ASA  Preterm labor symptoms and general obstetric precautions including but not limited to vaginal bleeding, contractions, leaking of fluid  and fetal movement were reviewed in detail with the patient. Please refer to After Visit Summary for other counseling recommendations.   Return in about 2 weeks (around 10/21/2020) for ROB - virtual.  Future Appointments  Date Time Provider Department Center  10/09/2020  8:45 AM [facility] None    Johnny Bridge, MD

## 2020-10-07 NOTE — Addendum Note (Signed)
Addended by: Kennon Portela on: 10/07/2020 04:24 PM   Modules accepted: Orders

## 2020-10-09 ENCOUNTER — Other Ambulatory Visit: Payer: Medicaid Other

## 2020-10-09 DIAGNOSIS — O0990 Supervision of high risk pregnancy, unspecified, unspecified trimester: Secondary | ICD-10-CM

## 2020-10-10 LAB — CBC
Hematocrit: 33.4 % — ABNORMAL LOW (ref 34.0–46.6)
Hemoglobin: 11.3 g/dL (ref 11.1–15.9)
MCH: 30.1 pg (ref 26.6–33.0)
MCHC: 33.8 g/dL (ref 31.5–35.7)
MCV: 89 fL (ref 79–97)
Platelets: 191 10*3/uL (ref 150–450)
RBC: 3.75 x10E6/uL — ABNORMAL LOW (ref 3.77–5.28)
RDW: 12.2 % (ref 11.7–15.4)
WBC: 5.7 10*3/uL (ref 3.4–10.8)

## 2020-10-10 LAB — GLUCOSE TOLERANCE, 2 HOURS W/ 1HR
Glucose, 1 hour: 114 mg/dL (ref 65–179)
Glucose, 2 hour: 84 mg/dL (ref 65–152)
Glucose, Fasting: 87 mg/dL (ref 65–91)

## 2020-10-10 LAB — RPR: RPR Ser Ql: NONREACTIVE

## 2020-10-10 LAB — HIV ANTIBODY (ROUTINE TESTING W REFLEX): HIV Screen 4th Generation wRfx: NONREACTIVE

## 2020-10-21 ENCOUNTER — Telehealth (INDEPENDENT_AMBULATORY_CARE_PROVIDER_SITE_OTHER): Payer: Medicaid Other | Admitting: Medical

## 2020-10-21 ENCOUNTER — Encounter: Payer: Self-pay | Admitting: Medical

## 2020-10-21 DIAGNOSIS — O34219 Maternal care for unspecified type scar from previous cesarean delivery: Secondary | ICD-10-CM

## 2020-10-21 DIAGNOSIS — Z3A33 33 weeks gestation of pregnancy: Secondary | ICD-10-CM

## 2020-10-21 DIAGNOSIS — E669 Obesity, unspecified: Secondary | ICD-10-CM

## 2020-10-21 DIAGNOSIS — O99213 Obesity complicating pregnancy, third trimester: Secondary | ICD-10-CM

## 2020-10-21 DIAGNOSIS — O26893 Other specified pregnancy related conditions, third trimester: Secondary | ICD-10-CM

## 2020-10-21 DIAGNOSIS — O99891 Other specified diseases and conditions complicating pregnancy: Secondary | ICD-10-CM

## 2020-10-21 DIAGNOSIS — O0990 Supervision of high risk pregnancy, unspecified, unspecified trimester: Secondary | ICD-10-CM

## 2020-10-21 DIAGNOSIS — M549 Dorsalgia, unspecified: Secondary | ICD-10-CM

## 2020-10-21 MED ORDER — COMFORT FIT MATERNITY SUPP MED MISC: 1.0000 [IU] | Freq: Every day | 0 refills | Status: DC

## 2020-10-21 NOTE — Patient Instructions (Signed)
Fetal Movement Counts Patient Name: ________________________________________________ Patient Due Date: ____________________ What is a fetal movement count?  A fetal movement count is the number of times that you feel your baby move during a certain amount of time. This may also be called a fetal kick count. A fetal movement count is recommended for every pregnant woman. You may be asked to start counting fetal movements as early as week 28 of your pregnancy. Pay attention to when your baby is most active. You may notice your baby's sleep and wake cycles. You may also notice things that make your baby move more. You should do a fetal movement count:  When your baby is normally most active.  At the same time each day. A good time to count movements is while you are resting, after having something to eat and drink. How do I count fetal movements? 1. Find a quiet, comfortable area. Sit, or lie down on your side. 2. Write down the date, the start time and stop time, and the number of movements that you felt between those two times. Take this information with you to your health care visits. 3. Write down your start time when you feel the first movement. 4. Count kicks, flutters, swishes, rolls, and jabs. You should feel at least 10 movements. 5. You may stop counting after you have felt 10 movements, or if you have been counting for 2 hours. Write down the stop time. 6. If you do not feel 10 movements in 2 hours, contact your health care provider for further instructions. Your health care provider may want to do additional tests to assess your baby's well-being. Contact a health care provider if:  You feel fewer than 10 movements in 2 hours.  Your baby is not moving like he or she usually does. Date: ____________ Start time: ____________ Stop time: ____________ Movements: ____________ Date: ____________ Start time: ____________ Stop time: ____________ Movements: ____________ Date: ____________  Start time: ____________ Stop time: ____________ Movements: ____________ Date: ____________ Start time: ____________ Stop time: ____________ Movements: ____________ Date: ____________ Start time: ____________ Stop time: ____________ Movements: ____________ Date: ____________ Start time: ____________ Stop time: ____________ Movements: ____________ Date: ____________ Start time: ____________ Stop time: ____________ Movements: ____________ Date: ____________ Start time: ____________ Stop time: ____________ Movements: ____________ Date: ____________ Start time: ____________ Stop time: ____________ Movements: ____________ This information is not intended to replace advice given to you by your health care provider. Make sure you discuss any questions you have with your health care provider. Document Revised: 08/02/2019 Document Reviewed: 08/02/2019 Elsevier Patient Education  2020 Elsevier Inc[employer] Braxton Hicks Contractions Contractions of the uterus can occur throughout pregnancy, but they are not always a sign that you are in labor. You may have practice contractions called Braxton Hicks contractions. These false labor contractions are sometimes confused with true labor. What are Braxton Hicks contractions? Braxton Hicks contractions are tightening movements that occur in the muscles of the uterus before labor. Unlike true labor contractions, these contractions do not result in opening (dilation) and thinning of the cervix. Toward the end of pregnancy (32-34 weeks), Braxton Hicks contractions can happen more often and may become stronger. These contractions are sometimes difficult to tell apart from true labor because they can be very uncomfortable. You should not feel embarrassed if you go to the hospital with false labor. Sometimes, the only way to tell if you are in true labor is for your health care provider to look for changes in the cervix. The health care provider   will do a physical exam and may  monitor your contractions. If you are not in true labor, the exam should show that your cervix is not dilating and your water has not broken. If there are no other health problems associated with your pregnancy, it is completely safe for you to be sent home with false labor. You may continue to have Braxton Hicks contractions until you go into true labor. How to tell the difference between true labor and false labor True labor  Contractions last 30-70 seconds.  Contractions become very regular.  Discomfort is usually felt in the top of the uterus, and it spreads to the lower abdomen and low back.  Contractions do not go away with walking.  Contractions usually become more intense and increase in frequency.  The cervix dilates and gets thinner. False labor  Contractions are usually shorter and not as strong as true labor contractions.  Contractions are usually irregular.  Contractions are often felt in the front of the lower abdomen and in the groin.  Contractions may go away when you walk around or change positions while lying down.  Contractions get weaker and are shorter-lasting as time goes on.  The cervix usually does not dilate or become thin. Follow these instructions at home:   Take over-the-counter and prescription medicines only as told by your health care provider.  Keep up with your usual exercises and follow other instructions from your health care provider.  Eat and drink lightly if you think you are going into labor.  If Braxton Hicks contractions are making you uncomfortable: ? Change your position from lying down or resting to walking, or change from walking to resting. ? Sit and rest in a tub of warm water. ? Drink enough fluid to keep your urine pale yellow. Dehydration may cause these contractions. ? Do slow and deep breathing several times an hour.  Keep all follow-up prenatal visits as told by your health care provider. This is important. Contact a  health care provider if:  You have a fever.  You have continuous pain in your abdomen. Get help right away if:  Your contractions become stronger, more regular, and closer together.  You have fluid leaking or gushing from your vagina.  You pass blood-tinged mucus (bloody show).  You have bleeding from your vagina.  You have low back pain that you never had before.  You feel your baby's head pushing down and causing pelvic pressure.  Your baby is not moving inside you as much as it used to. Summary  Contractions that occur before labor are called Braxton Hicks contractions, false labor, or practice contractions.  Braxton Hicks contractions are usually shorter, weaker, farther apart, and less regular than true labor contractions. True labor contractions usually become progressively stronger and regular, and they become more frequent.  Manage discomfort from Braxton Hicks contractions by changing position, resting in a warm bath, drinking plenty of water, or practicing deep breathing. This information is not intended to replace advice given to you by your health care provider. Make sure you discuss any questions you have with your health care provider. Document Revised: 11/25/2017 Document Reviewed: 04/28/2017 Elsevier Patient Education  2020 Elsevier Inc.  

## 2020-10-21 NOTE — Progress Notes (Addendum)
I connected with  Claudia Miller on 10/21/20 by a video enabled telemedicine application and verified that I am speaking with the correct person using two identifiers. Patient is at the [employer] is at [facility]   I discussed the limitations of evaluation and management by telemedicine. The patient expressed understanding and agreed to proceed.  MyChart OB, c/o cramps. Wants Tubal, BTS papers have not been signed. She is unable to check BP because she is at the store.

## 2020-10-23 NOTE — Progress Notes (Signed)
I connected with Claudia Miller 10/23/20 at  4:00 PM EDT by: MyChart video and verified that I am speaking with the correct person using two identifiers.  Patient is located at home and provider is located at [facility].     The purpose of this virtual visit is to provide medical care while limiting exposure to the novel coronavirus. I discussed the limitations, risks, security and privacy concerns of performing an evaluation and management service by MyChart video and the availability of in person appointments. I also discussed with the patient that there may be a patient responsible charge related to this service. By engaging in this virtual visit, you consent to the provision of healthcare.  Additionally, you authorize for your insurance to be billed for the services provided during this visit.  The patient expressed understanding and agreed to proceed.  The following staff members participated in the virtual visit:  Georgana Curio, CMA    PRENATAL VISIT NOTE  Subjective:  Claudia Miller is a 29 y.o. [phone] at [redacted]w[redacted]d  for phone visit for ongoing prenatal care.  She is currently monitored for the following issues for this high-risk pregnancy and has History of cesarean delivery, currently pregnant; Supervision of high risk pregnancy, antepartum; and Obesity affecting pregnancy in third trimester, antepartum on their problem list.  Patient reports backache.  Contractions: Irritability. Vag. Bleeding: None.  Movement: Present. Denies leaking of fluid.   The following portions of the patient's history were reviewed and updated as appropriate: allergies, current medications, past family history, past medical history, past social history, past surgical history and problem list.   Objective:  There were no vitals filed for this visit. Self-Obtained  Fetal Status:     Movement: Present     Assessment and Plan:  Pregnancy: G4P3003 at [redacted]w[redacted]d 1. Supervision of high risk pregnancy, antepartum -  Desires BTL, will need appointment ASAP to sign consent  2. Obesity affecting pregnancy in third trimester, antepartum - Follow-up US rescheduled, patient had previously cancelled growth scan  3. History of cesarean delivery, currently pregnant TOLAC planned, consent signed previously   4. Back pain affecting pregnancy in third trimester - Elastic Bandages & Supports (COMFORT FIT MATERNITY SUPP MED) MISC; 1 Units by Does not apply route daily.  Dispense: 1 each; Refill: 0  Preterm labor symptoms and general obstetric precautions including but not limited to vaginal bleeding, contractions, leaking of fluid and fetal movement were reviewed in detail with the patient.  Return in about 2 weeks (around 11/04/2020) for LOB, Virtual.  Future Appointments  Date Time Provider Department Center  11/04/2020 11:00 AM Brock Bad, MD [facility] None     Time spent on virtual visit: 15 minutes  Vonzella Nipple, PA-C

## 2020-10-27 ENCOUNTER — Ambulatory Visit: Payer: Medicaid Other

## 2020-10-27 ENCOUNTER — Other Ambulatory Visit: Payer: Self-pay

## 2020-10-27 DIAGNOSIS — O0990 Supervision of high risk pregnancy, unspecified, unspecified trimester: Secondary | ICD-10-CM

## 2020-10-27 NOTE — Progress Notes (Addendum)
Nurse visit to sign BTL form discussed during 10/21/20 provider visit.

## 2020-10-29 ENCOUNTER — Encounter (HOSPITAL_COMMUNITY): Payer: Self-pay | Admitting: Obstetrics & Gynecology

## 2020-10-29 ENCOUNTER — Inpatient Hospital Stay (HOSPITAL_COMMUNITY)
Admission: AD | Admit: 2020-10-29 | Discharge: 2020-10-29 | Disposition: A | Discharge status: Home / Self Care | Payer: Medicaid Other | Attending: Obstetrics & Gynecology | Admitting: Obstetrics & Gynecology

## 2020-10-29 ENCOUNTER — Other Ambulatory Visit: Payer: Self-pay

## 2020-10-29 DIAGNOSIS — R519 Headache, unspecified: Secondary | ICD-10-CM | POA: Insufficient documentation

## 2020-10-29 DIAGNOSIS — O0990 Supervision of high risk pregnancy, unspecified, unspecified trimester: Secondary | ICD-10-CM

## 2020-10-29 DIAGNOSIS — O4703 False labor before 37 completed weeks of gestation, third trimester: Secondary | ICD-10-CM | POA: Diagnosis not present

## 2020-10-29 DIAGNOSIS — O26893 Other specified pregnancy related conditions, third trimester: Secondary | ICD-10-CM | POA: Diagnosis not present

## 2020-10-29 DIAGNOSIS — Z87891 Personal history of nicotine dependence: Secondary | ICD-10-CM | POA: Insufficient documentation

## 2020-10-29 DIAGNOSIS — O4700 False labor before 37 completed weeks of gestation, unspecified trimester: Secondary | ICD-10-CM | POA: Diagnosis not present

## 2020-10-29 DIAGNOSIS — Z3A34 34 weeks gestation of pregnancy: Secondary | ICD-10-CM | POA: Diagnosis not present

## 2020-10-29 LAB — URINALYSIS, ROUTINE W REFLEX MICROSCOPIC
Bilirubin Urine: NEGATIVE
Glucose, UA: NEGATIVE mg/dL
Hgb urine dipstick: NEGATIVE
Ketones, ur: 5 mg/dL — AB
Nitrite: NEGATIVE
Protein, ur: NEGATIVE mg/dL
Specific Gravity, Urine: 1.016 (ref 1.005–1.030)
pH: 6 (ref 5.0–8.0)

## 2020-10-29 MED ORDER — NIFEDIPINE 10 MG PO CAPS
10.0000 mg | ORAL_CAPSULE | ORAL | Status: DC | PRN
  Administered 2020-10-29 (×2): 10 mg via ORAL
  Filled 2020-10-29 (×2): qty 1

## 2020-10-29 MED ORDER — ACETAMINOPHEN 325 MG PO TABS
325.0000 mg | ORAL_TABLET | Freq: Once | ORAL | Status: AC
  Administered 2020-10-29: 325 mg via ORAL
  Filled 2020-10-29: qty 1

## 2020-10-29 MED ORDER — BUTALBITAL-APAP-CAFFEINE 50-325-40 MG PO TABS
1.0000 | ORAL_TABLET | Freq: Once | ORAL | Status: AC
  Administered 2020-10-29: 1 via ORAL
  Filled 2020-10-29: qty 1

## 2020-10-29 NOTE — MAU Note (Signed)
Talked with pt and will hold procardia for now and see if h/a meds helps. Cool cloth to pt's forehead.

## 2020-10-29 NOTE — Discharge Instructions (Signed)

## 2020-10-29 NOTE — Progress Notes (Signed)
[name] CNM in earlier to discuss d/c plan. WRitten and verbal d/c instructions given and understanding voiced 

## 2020-10-29 NOTE — MAU Note (Signed)
Ctxs all wk. When I lay down at night pain is worse. More like period cramps. Stools looser than usual. Some pelvic pressure and lower back pain. When I turn in bed or get up or sit sometimes my bones "pop". Pt points to upper legs and pelvic area when states this. Denies LOF or VB.

## 2020-10-29 NOTE — MAU Provider Note (Signed)
Chief Complaint:  Contractions   First Provider Initiated Contact with Patient 10/29/20 2023     HPI: Claudia Miller is a 29 y.o. [phone] at 7w5dwho presents to maternity admissions reporting cramping and low back pain all week.  Feels like cramping. She reports good fetal movement, denies LOF, vaginal bleeding, vaginal itching/burning, urinary symptoms, h/a, dizziness, n/v, diarrhea, constipation or fever/chills.    Abdominal Pain This is a new problem. The current episode started in the past 7 days. The onset quality is gradual. The problem occurs intermittently. The problem has been unchanged. The pain is located in the LLQ and RLQ. The pain is moderate. The quality of the pain is cramping. The abdominal pain radiates to the back. Pertinent negatives include no anorexia, constipation, diarrhea, dysuria, fever, frequency or nausea. Nothing aggravates the pain. The pain is relieved by nothing. She has tried nothing for the symptoms.   RN Note: Ctxs all wk. When I lay down at night pain is worse. More like period cramps. Stools looser than usual. Some pelvic pressure and lower back pain. When I turn in bed or get up or sit sometimes my bones "pop". Pt points to upper legs and pelvic area when states this. Denies LOF or VB.    Past Medical History: Past Medical History:  Diagnosis Date  . Anemia   . 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 Supervision of normal pregnancy, antepartum 07/12/2018    Nursing Staff Provider Office Location  [facility] Dating   [place] 6.4 on 06/04/18 Language  English  Anatomy US  Normal Flu Vaccine   08/15/18 Genetic Screen  NIPS: normal  TDaP vaccine   11/30/18 Hgb A1C or  GTT Early  Third trimester nl 2 hour  Rhogam   n/a   LAB RESULTS  Feeding Plan Bottle Blood Type A/Positive/-- (07/17 1211)  Contraception Depo Antibody Negative (07/17 1211) Circumcision  girl  Rubell  . Trichomoniasis 08/09/2018   [x]  TOC    Past obstetric history: OB History  Gravida  Para Term Preterm AB Living  4 3 3     3   SAB TAB Ectopic Multiple Live Births        0 3    # Outcome Date GA Lbr Len/2nd Weight Sex Delivery Anes PTL Lv  4 Current           3 Term 01/23/19 [redacted]w[redacted]d  2845 g F CS-LTranv EPI  LIV  2 Term 07/27/14 [redacted]w[redacted]d 14:10 / 00:25 2745 g M Vag-Spont EPI  LIV  1 Term     F Vag-Spont EPI N LIV    Past Surgical History: Past Surgical History:  Procedure Laterality Date  . CESAREAN SECTION N/A 01/23/2019   Procedure: CESAREAN SECTION;  Surgeon: [redacted]w[redacted]d, MD;  Location: [facility];  Service: Obstetrics;  Laterality: N/A;  . NO PAST SURGERIES      Family History: Family History  Problem Relation Age of Onset  . Asthma Mother   . Diabetes Mother   . Hypertension Mother   . Cancer Father   . Hypertension Father   . Asthma Sister   . Hypertension Sister   . Asthma Brother   . Hypertension Brother     Social History: Social History   Tobacco Use  . Smoking status: Former Smoker    Packs/day: 0.00    Types: Cigarettes    Quit date: 05/31/2018    Years since quitting: 2.4  . Smokeless tobacco: Never  Used  Vaping Use  . Vaping Use: Never used  Substance Use Topics  . Alcohol use: No  . Drug use: No    Allergies:  Allergies  Allergen Reactions  . Fish Allergy Anaphylaxis    Meds:  Medications Prior to Admission  Medication Sig Dispense Refill Last Dose  . omeprazole (PRILOSEC) 20 MG capsule Take 1 capsule (20 mg total) by mouth 2 (two) times daily before a meal. 60 capsule 5 Past Week at Unknown time  . Prenatal Vit-Fe Fumarate-FA (PREPLUS) 27-1 MG TABS Take 1 tablet by mouth daily. 30 tablet 13 10/29/2020 at Unknown time  . aspirin EC 81 MG tablet Take 1 tablet (81 mg total) by mouth daily. Take after 12 weeks for prevention of preeclampsia later in pregnancy 300 tablet 2   . Doxylamine-Pyridoxine (DICLEGIS) 10-10 MG TBEC Take 2 tablets by mouth at bedtime. If symptoms persist, add one tablet in the morning and one in the  afternoon 100 tablet 5 More than a month at Unknown time  . Elastic Bandages & Supports (COMFORT FIT MATERNITY SUPP MED) MISC 1 Units by Does not apply route daily. 1 each 0   . promethazine (PHENERGAN) 25 MG tablet Take 1 tablet (25 mg total) by mouth every 6 (six) hours as needed for nausea or vomiting. (Patient not taking: Reported on 05/27/2020) 30 tablet 0     I have reviewed patient's Past Medical Hx, Surgical Hx, Family Hx, Social Hx, medications and allergies.   ROS:  Review of Systems  Constitutional: Negative for fever.  Gastrointestinal: Positive for abdominal pain. Negative for anorexia, constipation, diarrhea and nausea.  Genitourinary: Negative for dysuria and frequency.   Other systems negative  Physical Exam   Patient Vitals for the past 24 hrs:  BP Temp Pulse Resp SpO2 Height Weight  10/29/20 1943 121/71 -- 95 -- -- -- --  10/29/20 1921 140/71 98.5 F (36.9 C) 99 18 100 % 5\' 1"  (1.549 m) 93.9 kg   Constitutional: Well-developed, well-nourished female in no acute distress.  Cardiovascular: normal rate and rhythm Respiratory: normal effort, clear to auscultation bilaterally GI: Abd soft, non-tender, gravid appropriate for gestational age.   No rebound or guarding. MS: Extremities nontender, no edema, normal ROM Neurologic: Alert and oriented x 4.  GU: Neg CVAT.  PELVIC EXAM:   Dilation: Fingertip Effacement (%): 50, 40 Cervical Position: Posterior Station: Ballotable Presentation: Undeterminable Exam by:: [email or web address] CNM  FHT:  Baseline 140 , moderate variability, accelerations present, no decelerations Contractions: q 2-4 mins Irregular mixed with irritability   Labs: Results for orders placed or performed during the hospital encounter of 10/29/20 (from the past 24 hour(s))  Urinalysis, Routine w reflex microscopic Urine, Clean Catch     Status: Abnormal   Collection Time: 10/29/20  7:31 PM  Result Value Ref Range   Color, Urine YELLOW YELLOW   APPearance  HAZY (A) CLEAR   Specific Gravity, Urine 1.016 1.005 - 1.030   pH 6.0 5.0 - 8.0   Glucose, UA NEGATIVE NEGATIVE mg/dL   Hgb urine dipstick NEGATIVE NEGATIVE   Bilirubin Urine NEGATIVE NEGATIVE   Ketones, ur 5 (A) NEGATIVE mg/dL   Protein, ur NEGATIVE NEGATIVE mg/dL   Nitrite NEGATIVE NEGATIVE   Leukocytes,Ua SMALL (A) NEGATIVE   RBC / HPF 0-5 0 - 5 RBC/hpf   WBC, UA 0-5 0 - 5 WBC/hpf   Bacteria, UA FEW (A) NONE SEEN   Squamous Epithelial / LPF 6-10 0 - 5   Mucus PRESENT  A/Positive/-- (06/01 1354)  Imaging:  No results found.  MAU Course/MDM: I have ordered labs and reviewed results. Urine to culture NST reviewed, reactive  Treatments in MAU included Discussed options for tocolysis, as well as indications for tocolysis at this gestational age.  Reviewed hydration, Procardia and Terbutaline.  She opts for Procardia series  She got two doses which reduced her contractions. She developed a headache treated with One Fioricet and One Tylenol.    Assessment: Single iUP at [redacted]w[redacted]d Preterm uterine contractions vs uterine irritability Headache  Plan: Discharge home Preterm Labor precautions and fetal kick counts Follow up in Office for prenatal visits and recheck of cervix  Encouraged to return here or to other Urgent Care/ED if she develops worsening of symptoms, increase in pain, fever, or other concerning symptoms.  Pt stable at time of discharge.  [name] CNM, MSN Certified Nurse-Midwife 10/29/2020 8:23 PM

## 2020-10-30 LAB — CULTURE, OB URINE

## 2020-11-04 ENCOUNTER — Telehealth (INDEPENDENT_AMBULATORY_CARE_PROVIDER_SITE_OTHER): Payer: Medicaid Other | Admitting: Obstetrics

## 2020-11-04 ENCOUNTER — Encounter: Payer: Self-pay | Admitting: Obstetrics

## 2020-11-04 DIAGNOSIS — O99213 Obesity complicating pregnancy, third trimester: Secondary | ICD-10-CM

## 2020-11-04 DIAGNOSIS — M549 Dorsalgia, unspecified: Secondary | ICD-10-CM

## 2020-11-04 DIAGNOSIS — O26899 Other specified pregnancy related conditions, unspecified trimester: Secondary | ICD-10-CM

## 2020-11-04 DIAGNOSIS — O26893 Other specified pregnancy related conditions, third trimester: Secondary | ICD-10-CM

## 2020-11-04 DIAGNOSIS — Z3A35 35 weeks gestation of pregnancy: Secondary | ICD-10-CM

## 2020-11-04 DIAGNOSIS — O4703 False labor before 37 completed weeks of gestation, third trimester: Secondary | ICD-10-CM

## 2020-11-04 DIAGNOSIS — O99613 Diseases of the digestive system complicating pregnancy, third trimester: Secondary | ICD-10-CM

## 2020-11-04 DIAGNOSIS — O4700 False labor before 37 completed weeks of gestation, unspecified trimester: Secondary | ICD-10-CM

## 2020-11-04 DIAGNOSIS — R12 Heartburn: Secondary | ICD-10-CM

## 2020-11-04 DIAGNOSIS — O34219 Maternal care for unspecified type scar from previous cesarean delivery: Secondary | ICD-10-CM

## 2020-11-04 DIAGNOSIS — Z3480 Encounter for supervision of other normal pregnancy, unspecified trimester: Secondary | ICD-10-CM

## 2020-11-04 NOTE — Progress Notes (Signed)
Pt is having increase in pelvic pressure and ctx.

## 2020-11-04 NOTE — Progress Notes (Signed)
   OBSTETRICS PRENATAL VIRTUAL VISIT ENCOUNTER NOTE  Provider location: [facility] for [facility] [place]   I connected with Claudia Miller on 11/04/20 at 11:00 AM EST by MyChart Video Encounter at home and verified that I am speaking with the correct person using two identifiers.   I discussed the limitations, risks, security and privacy concerns of performing an evaluation and management service virtually and the availability of in person appointments. I also discussed with the patient that there may be a patient responsible charge related to this service. The patient expressed understanding and agreed to proceed. Subjective:  Claudia Miller is a 29 y.o. [phone] at [redacted]w[redacted]d being seen today for ongoing prenatal care.  She is currently monitored for the following issues for this low-risk pregnancy and has History of cesarean delivery, currently pregnant; Supervision of high risk pregnancy, antepartum; and Obesity affecting pregnancy in third trimester, antepartum on their problem list.  Patient reports backache and heartburn.  Contractions: Irregular. Vag. Bleeding: None.  Movement: Present. Denies any leaking of fluid.   The following portions of the patient's history were reviewed and updated as appropriate: allergies, current medications, past family history, past medical history, past social history, past surgical history and problem list.   Objective:  There were no vitals filed for this visit.  Fetal Status:     Movement: Present     General:  Alert, oriented and cooperative. Patient is in no acute distress.  Respiratory: Normal respiratory effort, no problems with respiration noted  Mental Status: Normal mood and affect. Normal behavior. Normal judgment and thought content.  Rest of physical exam deferred due to type of encounter  Imaging: No results found.  Assessment and Plan:  Pregnancy: G4P3003 at 106w4d 1. Encounter for supervision of normal intrauterine pregnancy in  multigravida, antepartum  2. History of cesarean delivery, currently pregnant  3. Preterm uterine contractions  4. Back pain affecting pregnancy in third trimester  5. Heartburn during pregnancy, antepartum  6. Obesity affecting pregnancy in third trimester, antepartum   Preterm labor symptoms and general obstetric precautions including but not limited to vaginal bleeding, contractions, leaking of fluid and fetal movement were reviewed in detail with the patient. I discussed the assessment and treatment plan with the patient. The patient was provided an opportunity to ask questions and all were answered. The patient agreed with the plan and demonstrated an understanding of the instructions. The patient was advised to call back or seek an in-person office evaluation/go to MAU at [facility] for any urgent or concerning symptoms. Please refer to After Visit Summary for other counseling recommendations.   I provided 15 minutes of face-to-face time during this encounter.  Return in about 1 week (around 11/11/2020) for ROB.  GBS.   Coral Ceo, MD Center for [facility], [facility] 11/04/20

## 2020-11-11 ENCOUNTER — Encounter: Payer: Medicaid Other | Admitting: Advanced Practice Midwife

## 2020-11-11 NOTE — Progress Notes (Deleted)
   PRENATAL VISIT NOTE  Subjective:  Claudia Miller is a 29 y.o. [phone] at [redacted]w[redacted]d being seen today for ongoing prenatal care.  She is currently monitored for the following issues for this {Blank single:19197::"high-risk","low-risk"} pregnancy and has History of cesarean delivery, currently pregnant; Supervision of high risk pregnancy, antepartum; and Obesity affecting pregnancy in third trimester, antepartum on their problem list.  Patient reports {sx:14538}.   .  .   . Denies leaking of fluid.   The following portions of the patient's history were reviewed and updated as appropriate: allergies, current medications, past family history, past medical history, past social history, past surgical history and problem list.   Objective:  There were no vitals filed for this visit.  Fetal Status:           General:  Alert, oriented and cooperative. Patient is in no acute distress.  Skin: Skin is warm and dry. No rash noted.   Cardiovascular: Normal heart rate noted  Respiratory: Normal respiratory effort, no problems with respiration noted  Abdomen: Soft, gravid, appropriate for gestational age.        Pelvic: {Blank single:19197::"Cervical exam performed in the presence of a chaperone","Cervical exam deferred"}        Extremities: Normal range of motion.     Mental Status: Normal mood and affect. Normal behavior. Normal judgment and thought content.   Assessment and Plan:  Pregnancy: G4P3003 at [redacted]w[redacted]d 1. Encounter for supervision of normal intrauterine pregnancy in multigravida, antepartum ***  2. History of cesarean delivery, currently pregnant ***  3. [redacted] weeks gestation of pregnancy ***  {Blank single:19197::"Term","Preterm"} labor symptoms and general obstetric precautions including but not limited to vaginal bleeding, contractions, leaking of fluid and fetal movement were reviewed in detail with the patient. Please refer to After Visit Summary for other counseling recommendations.    No follow-ups on file.  Future Appointments  Date Time Provider Department Center  11/11/2020  8:55 AM Leftwich-Kirby, Wilmer Floor, CNM [facility] None    Sharen Counter, CNM

## 2020-11-13 ENCOUNTER — Other Ambulatory Visit: Payer: Self-pay

## 2020-11-13 ENCOUNTER — Inpatient Hospital Stay (HOSPITAL_COMMUNITY): Payer: Medicaid Other | Admitting: Anesthesiology

## 2020-11-13 ENCOUNTER — Encounter (HOSPITAL_COMMUNITY): Payer: Self-pay | Admitting: Obstetrics and Gynecology

## 2020-11-13 ENCOUNTER — Inpatient Hospital Stay (HOSPITAL_COMMUNITY)
Admission: AD | Admit: 2020-11-13 | Discharge: 2020-11-15 | DRG: 807 | Disposition: A | Discharge status: Home / Self Care | Payer: Medicaid Other | Attending: Obstetrics & Gynecology | Admitting: Obstetrics & Gynecology

## 2020-11-13 DIAGNOSIS — O34219 Maternal care for unspecified type scar from previous cesarean delivery: Secondary | ICD-10-CM | POA: Diagnosis not present

## 2020-11-13 DIAGNOSIS — Z87891 Personal history of nicotine dependence: Secondary | ICD-10-CM

## 2020-11-13 DIAGNOSIS — O99214 Obesity complicating childbirth: Secondary | ICD-10-CM | POA: Diagnosis present

## 2020-11-13 DIAGNOSIS — O0990 Supervision of high risk pregnancy, unspecified, unspecified trimester: Secondary | ICD-10-CM

## 2020-11-13 DIAGNOSIS — Z23 Encounter for immunization: Secondary | ICD-10-CM

## 2020-11-13 DIAGNOSIS — Z20822 Contact with and (suspected) exposure to covid-19: Secondary | ICD-10-CM | POA: Diagnosis not present

## 2020-11-13 DIAGNOSIS — Z3A36 36 weeks gestation of pregnancy: Secondary | ICD-10-CM

## 2020-11-13 HISTORY — DX: Supervision of high risk pregnancy, unspecified, unspecified trimester: O09.90

## 2020-11-13 LAB — CBC
HCT: 37 % (ref 36.0–46.0)
Hemoglobin: 11.9 g/dL — ABNORMAL LOW (ref 12.0–15.0)
MCH: 29 pg (ref 26.0–34.0)
MCHC: 32.2 g/dL (ref 30.0–36.0)
MCV: 90 fL (ref 80.0–100.0)
Platelets: 205 10*3/uL (ref 150–400)
RBC: 4.11 MIL/uL (ref 3.87–5.11)
RDW: 13.2 % (ref 11.5–15.5)
WBC: 7.8 10*3/uL (ref 4.0–10.5)
nRBC: 0 % (ref 0.0–0.2)

## 2020-11-13 LAB — TYPE AND SCREEN
ABO/RH(D): A POS
Antibody Screen: NEGATIVE

## 2020-11-13 LAB — RESPIRATORY PANEL BY RT PCR (FLU A&B, COVID)
Influenza A by PCR: NEGATIVE
Influenza B by PCR: NEGATIVE
SARS Coronavirus 2 by RT PCR: NEGATIVE

## 2020-11-13 LAB — RPR: RPR Ser Ql: NONREACTIVE

## 2020-11-13 MED ORDER — SODIUM CHLORIDE 0.9 % IV SOLN
5.0000 10*6.[IU] | Freq: Once | INTRAVENOUS | Status: AC
  Administered 2020-11-13: 5 10*6.[IU] via INTRAVENOUS
  Filled 2020-11-13: qty 5

## 2020-11-13 MED ORDER — EPHEDRINE 5 MG/ML INJ: 10.0000 mg | INTRAVENOUS | Status: DC | PRN

## 2020-11-13 MED ORDER — SENNOSIDES-DOCUSATE SODIUM 8.6-50 MG PO TABS
2.0000 | ORAL_TABLET | ORAL | Status: DC
  Administered 2020-11-14 – 2020-11-15 (×2): 2 via ORAL
  Filled 2020-11-13 (×2): qty 2

## 2020-11-13 MED ORDER — DIPHENHYDRAMINE HCL 25 MG PO CAPS: 25.0000 mg | ORAL_CAPSULE | Freq: Four times a day (QID) | ORAL | Status: DC | PRN

## 2020-11-13 MED ORDER — SODIUM CHLORIDE (PF) 0.9 % IJ SOLN
INTRAMUSCULAR | Status: DC | PRN
  Administered 2020-11-13: 12 mL/h via EPIDURAL

## 2020-11-13 MED ORDER — LACTATED RINGERS IV SOLN: INTRAVENOUS | Status: DC

## 2020-11-13 MED ORDER — PHENYLEPHRINE 40 MCG/ML (10ML) SYRINGE FOR IV PUSH (FOR BLOOD PRESSURE SUPPORT): 80.0000 ug | PREFILLED_SYRINGE | INTRAVENOUS | Status: DC | PRN

## 2020-11-13 MED ORDER — SIMETHICONE 80 MG PO CHEW: 80.0000 mg | CHEWABLE_TABLET | ORAL | Status: DC | PRN

## 2020-11-13 MED ORDER — LIDOCAINE HCL (PF) 1 % IJ SOLN
INTRAMUSCULAR | Status: DC | PRN
  Administered 2020-11-13: 2 mL via EPIDURAL
  Administered 2020-11-13: 3 mL via EPIDURAL
  Administered 2020-11-13: 5 mL via EPIDURAL

## 2020-11-13 MED ORDER — DIBUCAINE (PERIANAL) 1 % EX OINT: 1.0000 "application " | TOPICAL_OINTMENT | CUTANEOUS | Status: DC | PRN

## 2020-11-13 MED ORDER — ACETAMINOPHEN 325 MG PO TABS
650.0000 mg | ORAL_TABLET | ORAL | Status: DC
  Administered 2020-11-13 – 2020-11-15 (×12): 650 mg via ORAL
  Filled 2020-11-13 (×12): qty 2

## 2020-11-13 MED ORDER — TETANUS-DIPHTH-ACELL PERTUSSIS 5-2.5-18.5 LF-MCG/0.5 IM SUSY
0.5000 mL | PREFILLED_SYRINGE | Freq: Once | INTRAMUSCULAR | Status: AC
  Administered 2020-11-14: 0.5 mL via INTRAMUSCULAR
  Filled 2020-11-13: qty 0.5

## 2020-11-13 MED ORDER — OXYCODONE-ACETAMINOPHEN 5-325 MG PO TABS: 1.0000 | ORAL_TABLET | ORAL | Status: DC | PRN

## 2020-11-13 MED ORDER — LACTATED RINGERS IV SOLN: 500.0000 mL | Freq: Once | INTRAVENOUS | Status: DC

## 2020-11-13 MED ORDER — LACTATED RINGERS IV SOLN: 500.0000 mL | INTRAVENOUS | Status: DC | PRN

## 2020-11-13 MED ORDER — ONDANSETRON HCL 4 MG/2ML IJ SOLN: 4.0000 mg | INTRAMUSCULAR | Status: DC | PRN

## 2020-11-13 MED ORDER — PHENYLEPHRINE 40 MCG/ML (10ML) SYRINGE FOR IV PUSH (FOR BLOOD PRESSURE SUPPORT)
80.0000 ug | PREFILLED_SYRINGE | INTRAVENOUS | Status: DC | PRN
  Filled 2020-11-13: qty 10

## 2020-11-13 MED ORDER — LIDOCAINE HCL (PF) 1 % IJ SOLN: 30.0000 mL | INTRAMUSCULAR | Status: DC | PRN

## 2020-11-13 MED ORDER — ONDANSETRON HCL 4 MG PO TABS: 4.0000 mg | ORAL_TABLET | ORAL | Status: DC | PRN

## 2020-11-13 MED ORDER — IBUPROFEN 600 MG PO TABS
600.0000 mg | ORAL_TABLET | Freq: Four times a day (QID) | ORAL | Status: DC
  Administered 2020-11-13 – 2020-11-15 (×9): 600 mg via ORAL
  Filled 2020-11-13 (×3): qty 1
  Filled 2020-11-13: qty 3
  Filled 2020-11-13 (×2): qty 1
  Filled 2020-11-13: qty 3
  Filled 2020-11-13: qty 1

## 2020-11-13 MED ORDER — OXYCODONE-ACETAMINOPHEN 5-325 MG PO TABS: 2.0000 | ORAL_TABLET | ORAL | Status: DC | PRN

## 2020-11-13 MED ORDER — OXYTOCIN BOLUS FROM INFUSION
333.0000 mL | Freq: Once | INTRAVENOUS | Status: AC
  Administered 2020-11-13: 333 mL via INTRAVENOUS

## 2020-11-13 MED ORDER — MEASLES, MUMPS & RUBELLA VAC IJ SOLR: 0.5000 mL | Freq: Once | INTRAMUSCULAR | Status: DC

## 2020-11-13 MED ORDER — COCONUT OIL OIL: 1.0000 "application " | TOPICAL_OIL | Status: DC | PRN

## 2020-11-13 MED ORDER — OXYTOCIN-SODIUM CHLORIDE 30-0.9 UT/500ML-% IV SOLN
2.5000 [IU]/h | INTRAVENOUS | Status: DC
  Filled 2020-11-13: qty 500

## 2020-11-13 MED ORDER — WITCH HAZEL-GLYCERIN EX PADS: 1.0000 "application " | MEDICATED_PAD | CUTANEOUS | Status: DC | PRN

## 2020-11-13 MED ORDER — PENICILLIN G POT IN DEXTROSE 60000 UNIT/ML IV SOLN
3.0000 10*6.[IU] | INTRAVENOUS | Status: DC
  Administered 2020-11-13: 3 10*6.[IU] via INTRAVENOUS
  Filled 2020-11-13: qty 50

## 2020-11-13 MED ORDER — DIPHENHYDRAMINE HCL 50 MG/ML IJ SOLN: 12.5000 mg | INTRAMUSCULAR | Status: DC | PRN

## 2020-11-13 MED ORDER — BENZOCAINE-MENTHOL 20-0.5 % EX AERO
1.0000 "application " | INHALATION_SPRAY | CUTANEOUS | Status: DC | PRN
  Filled 2020-11-13: qty 56

## 2020-11-13 MED ORDER — FENTANYL-BUPIVACAINE-NACL 0.5-0.125-0.9 MG/250ML-% EP SOLN
12.0000 mL/h | EPIDURAL | Status: DC | PRN
  Filled 2020-11-13: qty 250

## 2020-11-13 MED ORDER — ACETAMINOPHEN 325 MG PO TABS: 650.0000 mg | ORAL_TABLET | ORAL | Status: DC | PRN

## 2020-11-13 MED ORDER — ONDANSETRON HCL 4 MG/2ML IJ SOLN
4.0000 mg | Freq: Four times a day (QID) | INTRAMUSCULAR | Status: DC | PRN
  Administered 2020-11-13: 4 mg via INTRAVENOUS
  Filled 2020-11-13: qty 2

## 2020-11-13 MED ORDER — SOD CITRATE-CITRIC ACID 500-334 MG/5ML PO SOLN: 30.0000 mL | ORAL | Status: DC | PRN

## 2020-11-13 NOTE — Anesthesia Preprocedure Evaluation (Signed)
Anesthesia Evaluation  Patient identified by MRN, date of birth, ID band Patient awake    Reviewed: Allergy & Precautions, Patient's Chart, lab work & pertinent test results  Airway Mallampati: II       Dental   Pulmonary former smoker,    Pulmonary exam normal        Cardiovascular negative cardio ROS Normal cardiovascular exam     Neuro/Psych negative neurological ROS     GI/Hepatic negative GI ROS, Neg liver ROS,   Endo/Other  negative endocrine ROS  Renal/GU negative Renal ROS     Musculoskeletal   Abdominal   Peds  Hematology negative hematology ROS (+)   Anesthesia Other Findings   Reproductive/Obstetrics (+) Pregnancy                             Anesthesia Physical Anesthesia Plan  ASA: III  Anesthesia Plan: Epidural   Post-op Pain Management:    Induction:   PONV Risk Score and Plan: Treatment may vary due to age or medical condition  Airway Management Planned: Natural Airway  Additional Equipment:   Intra-op Plan:   Post-operative Plan:   Informed Consent: I have reviewed the patients History and Physical, chart, labs and discussed the procedure including the risks, benefits and alternatives for the proposed anesthesia with the patient or authorized representative who has indicated his/her understanding and acceptance.       Plan Discussed with:   Anesthesia Plan Comments:         Anesthesia Quick Evaluation

## 2020-11-13 NOTE — MAU Note (Addendum)
Ctxs since 2130. Saw blood on tissue when wiped one time. No recent sve. Have had about 5 loose BMs since ctx started

## 2020-11-13 NOTE — Discharge Summary (Signed)
Postpartum Discharge Summary    Patient Name: Claudia Miller DOB: 03-Jun-1991 MRN: [id number]  Date of admission: 11/13/2020 Delivery date:11/13/2020  Delivering provider: Layla Barter  Date of discharge: 11/15/2020  Admitting diagnosis: High-risk pregnancy [O09.90] Intrauterine pregnancy: [redacted]w[redacted]d    Secondary diagnosis:  Active Problems:   History of cesarean delivery, currently pregnant   Supervision of high risk pregnancy, antepartum   High-risk pregnancy  Additional problems: None    Discharge diagnosis: Preterm Pregnancy Delivered and VBAC                                              Post partum procedures:None Augmentation: AROM Complications: None  Hospital course: Onset of Labor With Vaginal Delivery      29y.o. yo [id number] [id number] admitted in Active Labor on 11/13/2020. Patient had an uncomplicated labor course as follows:  Membrane Rupture Time/Date: 9:27 AM ,11/13/2020   Delivery Method:VBAC, Spontaneous , VBAC Episiotomy: None  Lacerations:  None  Patient had an uncomplicated postpartum course.  [name], tolerating a regular diet, passing flatus, and urinating well. Patient is discharged home in stable condition on 11/15/20.  Newborn Data: Birth date:11/13/2020  Birth time:12:37 PM  Gender:Female  Living status:Living  Apgars:9 ,9  Weight:2211 g   Magnesium Sulfate received: No BMZ received: No Rhophylac:No MMR:Yes T-DaP:Given prenatally Flu: No Transfusion:No  Physical exam  Vitals:   11/14/20 0428 11/14/20 1622 11/14/20 2047 11/15/20 0533  BP: (!) 117/57 118/65 (!) 110/53 (!) 121/51  Pulse: 69 77 75 66  Resp: _0 Temp: (!) 97.5 F (36.4 C) 98.3 F (36.8 C) 98.8 F (37.1 C) 98.1 F (36.7 C)  TempSrc: Oral Oral Oral Oral  SpO2:  100% 100%   Weight:      Height:       General: alert, cooperative and no distress Lochia: appropriate Uterine Fundus: firm Incision: N/A DVT Evaluation: No evidence of DVT seen on  physical exam. Labs: Lab Results  Component Value Date   WBC 7.8 11/13/2020   HGB 11.9 (L) 11/13/2020   HCT 37.0 11/13/2020   MCV 90.0 11/13/2020   PLT 205 11/13/2020   CMP Latest Ref Rng & Units 04/15/2020  Glucose 70 - 99 mg/dL 85  BUN 6 - 20 mg/dL 5(L)  Creatinine 0.44 - 1.00 mg/dL 0.63  Sodium 135 - 145 mmol/L 135  Potassium 3.5 - 5.1 mmol/L 3.7  Chloride 98 - 111 mmol/L 102  CO2 22 - 32 mmol/L 23  Calcium 8.9 - 10.3 mg/dL 9.0  Total Protein 6.5 - 8.1 g/dL 7.7  Total Bilirubin 0.3 - 1.2 mg/dL 0.7  Alkaline Phos 38 - 126 U/L 52  AST 15 - 41 U/L 16  ALT 0 - 44 U/L 10   Edinburgh Score: Edinburgh Postnatal Depression Scale Screening Tool 11/13/2020  I have been able to laugh and see the funny side of things. 0  I have looked forward with enjoyment to things. 0  I have blamed myself unnecessarily when things went wrong. 0  I have been anxious or worried for no good reason. 0  I have felt scared or panicky for no good reason. 0  Things have been getting on top of me. 1  I have been so unhappy that I have had difficulty sleeping. 0  I have felt sad or miserable.  0  I have been so unhappy that I have been crying. 0  The thought of harming myself has occurred to me. 0  Edinburgh Postnatal Depression Scale Total 1     After visit meds:  Allergies as of 11/15/2020      Reactions   Fish Allergy Anaphylaxis      Medication List    STOP taking these medications   aspirin EC 81 MG tablet   Comfort Fit Maternity Supp Med Misc   Doxylamine-Pyridoxine 10-10 MG Tbec Commonly known as: Diclegis   omeprazole 20 MG capsule Commonly known as: PriLOSEC   PrePLUS 27-1 MG Tabs   promethazine 25 MG tablet Commonly known as: PHENERGAN     TAKE these medications   acetaminophen 325 MG tablet Commonly known as: Tylenol Take 2 tablets (650 mg total) by mouth every 4 (four) hours.   coconut oil Oil Apply 1 application topically as needed.   ibuprofen 600 MG  tablet Commonly known as: ADVIL Take 1 tablet (600 mg total) by mouth every 6 (six) hours.       Discharge home in stable condition Infant Feeding: Bottle Infant Disposition:home with mother Discharge instruction: per After Visit Summary and Postpartum booklet. Activity: Advance as tolerated. Pelvic rest for 6 weeks.  Diet: routine diet Future Appointments: Future Appointments  Date Time Provider [place]  12/16/2020 11:15 AM Gavin Pound, CNM [facility] None    Follow up Visit: Please schedule this patient for a In person postpartum visit in 6 weeks with the following provider: Any provider. Additional Postpartum F/U:Postpartum IUD  High risk pregnancy complicated by: TOLAC Delivery mode:  VBAC, Spontaneous , VBAC Anticipated Birth Control:  IUD   11/15/2020 Layla Barter, MD

## 2020-11-13 NOTE — Progress Notes (Signed)
LABOR PROGRESS NOTE  Claudia Miller is a 29 y.o. [phone] at [redacted]w[redacted]d admitted for latent late preterm labor.  Subjective: No further emesis.Patient is comfortable, pain well controlled with epidural. VSS.  Objective: BP (!) 115/53   Pulse 71   Temp 98.4 F (36.9 C) (Oral)   Resp 20   Ht 5\' 1"  (1.549 m)   Wt 94.8 kg   LMP 03/12/2020 (LMP Unknown)   BMI 39.49 kg/m  or  Vitals:   11/13/20 0701 11/13/20 0731 11/13/20 0801 11/13/20 0831  BP: 111/61 116/61 118/61 (!) 115/53  Pulse: 73 71 71 71  Resp:  18 20 20   Temp:  98.4 F (36.9 C)    TempSrc:  Oral    Weight:      Height:       Dilation: 7.5 Effacement (%): 80 Cervical Position: Middle (left) Station: -3 Presentation: Vertex Exam by:: Dr FHT: baseline rate 130 bpm, moderate varibility, 15 x 15 acel, no decel Toco: q5 min  Labs: Lab Results  Component Value Date   WBC 7.8 11/13/2020   HGB 11.9 (L) 11/13/2020   HCT 37.0 11/13/2020   MCV 90.0 11/13/2020   PLT 205 11/13/2020    Patient Active Problem List   Diagnosis Date Noted  . High-risk pregnancy 11/13/2020  . Obesity affecting pregnancy in third trimester, antepartum 05/27/2020  . Supervision of high risk pregnancy, antepartum 05/20/2020  . History of cesarean delivery, currently pregnant 01/23/2019    Assessment / Plan: 29 y.o. G4P3003 at [redacted]w[redacted]d here for latent late preterm labor.   Labor: Expectant management. Will consider AROM at 0930 when first round of PCN is complete.  Fetal Wellbeing: Cat I Pain Control:  Epidural Anticipated MOD: Vaginal  26, MD PGY-1 11/13/2020, 9:03 AM

## 2020-11-13 NOTE — H&P (Addendum)
LABOR AND DELIVERY ADMISSION HISTORY AND PHYSICAL NOTE  Claudia Miller is a 29 y.o. female [phone] with IUP at [redacted]w[redacted]d by 21 wk [place] presenting for latent late preterm labor.   She reports positive fetal movement. She denies leakage of fluid or vaginal bleeding.  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   + THC in MAU 04/15/2020   Supervision of normal pregnancy, antepartum 07/12/2018    Nursing Staff Provider Office Location  [facility] Dating   [place] 6.4 on 06/04/18 Language  English  Anatomy US  Normal Flu Vaccine   08/15/18 Genetic Screen  NIPS: normal  TDaP vaccine   11/30/18 Hgb A1C or  GTT Early  Third trimester nl 2 hour  Rhogam   n/a   LAB RESULTS  Feeding Plan Bottle Blood Type A/Positive/-- (07/17 1211)  Contraception Depo Antibody Negative (07/17 1211) Circumcision  girl  Rubell   Trichomoniasis 08/09/2018   [x]  TOC   Past Surgical History: Past Surgical History:  Procedure Laterality Date   CESAREAN SECTION N/A 01/23/2019   Procedure: CESAREAN SECTION;  Surgeon: 01/25/2019, MD;  Location: [facility];  Service: Obstetrics;  Laterality: N/A;   NO PAST SURGERIES     Obstetrical History: OB History     Gravida  4   Para  3   Term  3   Preterm      AB      Living  3      SAB      TAB      Ectopic      Multiple  0   Live Births  3          Social History: Social History   Socioeconomic History   Marital status: Single    Spouse name: Not on file   Number of children: 3   Years of education: Not on file   Highest education level: Not on file  Occupational History   Occupation: unemployed  Tobacco Use   Smoking status: Former Smoker    Packs/day: 0.00    Types: Cigarettes    Quit date: 05/31/2018    Years since quitting: 2.4    Smokeless tobacco: Never Used  Vaping Use   Vaping Use: Never used  Substance and Sexual Activity   Alcohol use: No   Drug use: No   Sexual activity: Yes    Birth control/protection: None  Other Topics Concern   Not on file  Social History Narrative   Not on file   Social Determinants of Health   Financial Resource Strain:    Difficulty of Paying Living Expenses: Not on file  Food Insecurity:    Worried About 07/31/2018 in the Last Year: Not on file   Programme researcher, broadcasting/film/video of Food in the Last Year: Not on file  Transportation Needs:    Lack of Transportation (Medical): Not on file   Lack of Transportation (Non-Medical): Not on file  Physical Activity:    Days of Exercise per Week: Not on file   Minutes of Exercise per Session: Not on file  Stress:    Feeling of Stress : Not on file  Social Connections:    Frequency of Communication with Friends and Family: Not on file   Frequency of Social Gatherings with  Friends and Family: Not on file   Attends Religious Services: Not on file   Active Member of Clubs or Organizations: Not on file   Attends Banker Meetings: Not on file   Marital Status: Not on file   Family History: Family History  Problem Relation Age of Onset   Asthma Mother    Diabetes Mother    Hypertension Mother    Cancer Father    Hypertension Father    Asthma Sister    Hypertension Sister    Asthma Brother    Hypertension Brother    Allergies: Allergies  Allergen Reactions   Fish Allergy Anaphylaxis   Medications Prior to Admission  Medication Sig Dispense Refill Last Dose   aspirin EC 81 MG tablet Take 1 tablet (81 mg total) by mouth daily. Take after 12 weeks for prevention of preeclampsia later in pregnancy 300 tablet 2    Doxylamine-Pyridoxine (DICLEGIS) 10-10 MG TBEC Take 2 tablets by mouth at bedtime. If symptoms persist, add one tablet in the morning and one in the afternoon 100 tablet 5    Elastic Bandages & Supports (COMFORT FIT  MATERNITY SUPP MED) MISC 1 Units by Does not apply route daily. 1 each 0    omeprazole (PRILOSEC) 20 MG capsule Take 1 capsule (20 mg total) by mouth 2 (two) times daily before a meal. 60 capsule 5    Prenatal Vit-Fe Fumarate-FA (PREPLUS) 27-1 MG TABS Take 1 tablet by mouth daily. 30 tablet 13    promethazine (PHENERGAN) 25 MG tablet Take 1 tablet (25 mg total) by mouth every 6 (six) hours as needed for nausea or vomiting. (Patient not taking: Reported on 05/27/2020) 30 tablet 0    Review of Systems  All systems reviewed and negative except as stated in HPI  Physical Exam Temperature 98.3 F (36.8 C), resp. rate 18, height 5\' 1"  (1.549 m), weight 94.8 kg, last menstrual period 03/12/2020, unknown if currently breastfeeding. General appearance: alert, oriented, NAD Lungs: normal respiratory effort Abdomen: soft, non-tender; gravid, FH appropriate for GA Extremities: No calf swelling or tenderness Presentation: cephalic by 03/14/2020 Fetal monitoring: 125 bpm, moderate variability 15 x 15  acels no decels Uterine activity: 3-4 mins Dilation: 5 Effacement (%): 70 Station: -3 Exam by:: [email or web address] RN  Prenatal labs: ABO, Rh: A/Positive/-- (06/01 1354) Antibody: Negative (06/01 1354) Rubella: 6.46 (06/01 1354) RPR: Non Reactive (10/14 1138)  HBsAg: Negative (06/01 1354)  HIV: Non Reactive (10/14 1138)  GC/Chlamydia: unknown (collected on admission) GBS:   unknown (culture collected on admission) 2-hr GTT: wnl Genetic screening:  wnl Anatomy 03-11-2003: Normal, female, posterior placenta  Prenatal Transfer Tool  Maternal Diabetes: No Genetic Screening: Normal Maternal Ultrasounds/Referrals: Normal Fetal Ultrasounds or other Referrals:  None Maternal Substance Abuse:  No Significant Maternal Medications:  Meds include: Other: ASA intermittently Significant Maternal Lab Results: Other: GBS unknown  Results for orders placed or performed during the hospital encounter of 11/13/20 (from the past  24 hour(s))  Respiratory Panel by RT PCR (Flu A&B, Covid) - Nasopharyngeal Swab   Collection Time: 11/13/20  2:52 AM   Specimen: Nasopharyngeal Swab  Result Value Ref Range   SARS Coronavirus 2 by RT PCR NEGATIVE NEGATIVE   Influenza A by PCR NEGATIVE NEGATIVE   Influenza B by PCR NEGATIVE NEGATIVE    Patient Active Problem List   Diagnosis Date Noted   Obesity affecting pregnancy in third trimester, antepartum 05/27/2020   Supervision of high risk pregnancy, antepartum 05/20/2020  History of cesarean delivery, currently pregnant 01/23/2019    Assessment: JAYE SAAL is a 29 y.o. [phone] at [redacted]w[redacted]d here for spontaneous onset of labor.  #Labor  TOLAC: h/o VD x2, then Cesarean x1 secondary to NRFHTs. Pt desires TOLAC. Consent form signed in clinic on 09/23/20.   Reviewed risks/benefits of TOLAC versus RCS in detail. Patient counseled regarding potential vaginal delivery, chance of success, future implications, possible uterine rupture and need for urgent/emergent repeat cesarean. Counseled regarding potential need for repeat c-section for reasons unrelated to first c-section. Counseled regarding scheduled repeat cesarean including risks of bleeding, infection, damage to surrounding tissue, abnormal placentation, implications for future pregnancies. All questions answered.  Patient re-confirmed desire for TOLAC s/p consent as noted above.  #Pain: Plan for epidural per pt. #FWB: Category 1 strip #ID: GBS unknown >PCN; GBS culture sent on admission #MOF: bottle #MOC: pt desires BTL. Consent form signed in clinic on 10/27/20. #Circ: n/a  Simone Autry-Lott 11/13/2020, 2:46 AM  Attestation of Supervision of Resident:  I confirm that I have verified the information documented in the  resident's  note and that I have also personally reperformed the history, physical exam and all medical decision making activities.  I have verified that all services and findings are accurately documented in  this student's note; and I agree with management and plan as outlined in the documentation. I have also made any necessary editorial changes.  I personally counseled and consented pt regarding TOLAC as noted above.  Sheila Oats, MD Center for [facility], [facility] 11/13/2020 3:30 AM

## 2020-11-13 NOTE — Anesthesia Procedure Notes (Signed)
Epidural Patient location during procedure: OB Start time: 11/13/2020 4:48 AM End time: 11/13/2020 4:55 AM  Staffing Anesthesiologist: Marcene Duos, MD Performed: anesthesiologist   Preanesthetic Checklist Completed: patient identified, IV checked, site marked, risks and benefits discussed, surgical consent, monitors and equipment checked, pre-op evaluation and timeout performed  Epidural Patient position: sitting Prep: DuraPrep and site prepped and draped Patient monitoring: continuous pulse ox and blood pressure Approach: midline Location: L4-L5 Injection technique: LOR air  Needle:  Needle type: Tuohy  Needle gauge: 17 G Needle length: 9 cm and 9 Needle insertion depth: 8 cm Catheter type: closed end flexible Catheter size: 19 Gauge Catheter at skin depth: 16 cm Test dose: negative  Assessment Events: blood not aspirated, injection not painful, no injection resistance, no paresthesia and negative IV test

## 2020-11-13 NOTE — Progress Notes (Signed)
LABOR PROGRESS NOTE  Claudia Miller is a 29 y.o. [phone] at [redacted]w[redacted]d  admitted for latent late preterm labor.  Subjective: Experiencing emesis.   Objective: BP (!) 118/50 (BP Location: Right Arm)   Pulse 84   Temp 99 F (37.2 C) (Oral)   Resp 17   Ht 5\' 1"  (1.549 m)   Wt 94.8 kg   LMP 03/12/2020 (LMP Unknown)   BMI 39.49 kg/m  or  Vitals:   11/13/20 0106 11/13/20 0340  BP:  (!) 118/50  Pulse:  84  Resp: 18 17  Temp: 98.3 F (36.8 C) 99 F (37.2 C)  TempSrc:  Oral  Weight: 94.8 kg 94.8 kg  Height: 5\' 1"  (1.549 m) 5\' 1"  (1.549 m)   Dilation: 7.5 Effacement (%): 80 Cervical Position: Middle (left) Station: -3 Presentation: Vertex Exam by:: Dr FHT: baseline rate 125 bpm, moderate varibility, 15 x 15 acel, no decel Toco: 2-3 mins  Labs: Lab Results  Component Value Date   WBC 7.8 11/13/2020   HGB 11.9 (L) 11/13/2020   HCT 37.0 11/13/2020   MCV 90.0 11/13/2020   PLT 205 11/13/2020    Patient Active Problem List   Diagnosis Date Noted  . High-risk pregnancy 11/13/2020  . Obesity affecting pregnancy in third trimester, antepartum 05/27/2020  . Supervision of high risk pregnancy, antepartum 05/20/2020  . History of cesarean delivery, currently pregnant 01/23/2019    Assessment / Plan: 29 y.o. G4P3003 at [redacted]w[redacted]d here for latent late preterm labor.   Labor: Expectant management. Consider AROM when appropriate. Fetal Wellbeing: Cat I Pain Control:  Epidural Anticipated MOD: Vaginal  Yosmar Ryker Autry-Lott, DO 11/13/2020, 6:31 AM PGY-2, [facility]

## 2020-11-14 NOTE — Social Work (Signed)
CSW received consult for hx of marijuana use.  Referral was screened out due to the following:  ~MOB had no documented substance use after initial prenatal visit/+UPT. CSW reviewed chart and noted a positive drug screen during a MAU visit in April 2021. MOB initial prenatal visit is dated June 1 at [redacted]w[redacted]d. ~MOB had no positive drug screens after initial prenatal visit/+UPT. ~Baby's UDS is negative.  Please contact the Clinical Social Worker if needs arise, by [facility] request, or if MOB scores greater than 9/yes to question 10 on Edinburgh Postpartum Depression Screen.  CSW will monitor CDS results and make report to Child Protective Services if warranted.  Manfred Arch, LCSWA Clinical Social Work [employer] and [employer] [phone]

## 2020-11-14 NOTE — Progress Notes (Addendum)
POSTPARTUM PROGRESS NOTE  Subjective: Claudia Miller is a 29 y.o. [phone] s/p SVD at [redacted]w[redacted]d  She reports she is doing well. No acute events overnight. She denies any problems with ambulating, voiding or po intake. Denies nausea or vomiting. She has passed flatus. Pain is well controlled.  Lochia is present, but less than day prior. No passage of clots.   Objective: Blood pressure (!) 117/57, pulse 69, temperature (!) 97.5 F (36.4 C), temperature source Oral, resp. rate 16, height _0  (1.549 m), weight 94.8 kg, last menstrual period 03/12/2020, SpO2 100 %, unknown if currently breastfeeding.  Physical Exam:  General: alert, cooperative and no distress Chest: no respiratory distress Abdomen: soft, non-tender  Uterine Fundus: firm and at level of umbilicus Extremities: No calf swelling or tenderness  no edema  Recent Labs    11/13/20 0357  HGB 11.9*  HCT 37.0    Assessment/Plan: JSHINIQUA GROSECLOSEis a 29y.o. GX6P5374s/p SVD at 366w6d Routine Postpartum Care: Doing well, pain well-controlled.  -- Continue routine care, lactation support  --Patient GBS unknown, baby will need to be monitored for a minimum of 48 hours.  -- Contraception: interval BTL, papers signs 10/27/20-medicaid -- Feeding: Bottle  Dispo: Plan for discharge tomorrow.  BeLayla BarterMD 11/14/2020 7:55 AM  I personally saw and evaluated the patient, performing the key elements of the service. I developed and verified the management plan that is described in the resident's/student's note, and I agree with the content with my edits above. VSS, HRR&R, Resp unlabored, Legs neg.  FrNigel BertholdCNM 11/14/2020 8:11 AM

## 2020-11-14 NOTE — Anesthesia Postprocedure Evaluation (Signed)
Anesthesia Post Note  Patient: Claudia Miller  Procedure(s) Performed: AN AD HOC LABOR EPIDURAL     Patient location during evaluation: Mother Baby Anesthesia Type: Epidural Level of consciousness: awake and alert Pain management: pain level controlled Vital Signs Assessment: post-procedure vital signs reviewed and stable Respiratory status: spontaneous breathing, nonlabored ventilation and respiratory function stable Cardiovascular status: stable Postop Assessment: no headache, no backache and epidural receding Anesthetic complications: no   No complications documented.  Last Vitals:  Vitals:   11/14/20 0100 11/14/20 0428  BP: (!) 112/46 (!) 117/57  Pulse: 70 69  Resp: 16 16  Temp: 37 C (!) 36.4 C  SpO2:      Last Pain:  Vitals:   11/14/20 0604  TempSrc:   PainSc: 3    Pain Goal: Patients Stated Pain Goal: 0 (11/13/20 1705)                 Rica Records

## 2020-11-15 LAB — CULTURE, BETA STREP (GROUP B ONLY)

## 2020-11-15 MED ORDER — ACETAMINOPHEN 325 MG PO TABS
650.0000 mg | ORAL_TABLET | ORAL | Status: DC
Start: 2020-11-15 — End: 2022-11-02

## 2020-11-15 MED ORDER — COCONUT OIL OIL
1.0000 | TOPICAL_OIL | 0 refills | Status: DC | PRN
Start: 2020-11-15 — End: 2022-11-02

## 2020-11-15 MED ORDER — IBUPROFEN 600 MG PO TABS
600.0000 mg | ORAL_TABLET | Freq: Four times a day (QID) | ORAL | 0 refills | Status: DC
Start: 2020-11-15 — End: 2022-11-02

## 2020-12-16 ENCOUNTER — Ambulatory Visit: Payer: Medicaid Other

## 2020-12-17 ENCOUNTER — Inpatient Hospital Stay (HOSPITAL_COMMUNITY): Admit: 2020-12-17 | Payer: Self-pay

## 2021-01-02 DIAGNOSIS — Z20822 Contact with and (suspected) exposure to covid-19: Secondary | ICD-10-CM | POA: Diagnosis not present

## 2022-11-01 ENCOUNTER — Other Ambulatory Visit: Payer: Self-pay

## 2022-11-01 ENCOUNTER — Emergency Department (HOSPITAL_COMMUNITY)
Admission: EM | Admit: 2022-11-01 | Discharge: 2022-11-01 | Discharge status: Against Medical Advice | Payer: Medicaid Other | Attending: Medical | Admitting: Medical

## 2022-11-01 ENCOUNTER — Encounter (HOSPITAL_COMMUNITY): Payer: Self-pay

## 2022-11-01 ENCOUNTER — Emergency Department (HOSPITAL_COMMUNITY): Payer: Medicaid Other

## 2022-11-01 DIAGNOSIS — Z5321 Procedure and treatment not carried out due to patient leaving prior to being seen by health care provider: Secondary | ICD-10-CM | POA: Insufficient documentation

## 2022-11-01 DIAGNOSIS — M546 Pain in thoracic spine: Secondary | ICD-10-CM | POA: Insufficient documentation

## 2022-11-01 LAB — POC URINE PREG, ED: Preg Test, Ur: NEGATIVE

## 2022-11-01 MED ORDER — LIDOCAINE 5 % EX PTCH: 1.0000 | MEDICATED_PATCH | CUTANEOUS | Status: DC

## 2022-11-01 MED ORDER — KETOROLAC TROMETHAMINE 30 MG/ML IJ SOLN
30.0000 mg | Freq: Once | INTRAMUSCULAR | Status: AC
  Administered 2022-11-01: 30 mg via INTRAMUSCULAR
  Filled 2022-11-01: qty 1

## 2022-11-01 MED ORDER — CYCLOBENZAPRINE HCL 10 MG PO TABS
5.0000 mg | ORAL_TABLET | Freq: Once | ORAL | Status: AC
  Administered 2022-11-01: 5 mg via ORAL
  Filled 2022-11-01: qty 1

## 2022-11-01 NOTE — ED Triage Notes (Signed)
Pt reports sharp left upper back pain, worse with inspiration, onset 2-3 weeks ago. It went away but returned today when she held her head up when she was laying down. Denies recent injury.

## 2022-11-01 NOTE — ED Notes (Signed)
Registration states this patient left 

## 2022-11-01 NOTE — ED Provider Triage Note (Signed)
Emergency Medicine Provider Triage Evaluation Note  Claudia Miller , a 31 y.o. female  was evaluated in triage.  Pt complains of left back pain that has been for the last day. Reports that happened 3 weeks ago and resolved after few hours. Reports pain worse with movement. Feels stabbing pain down her L hand that is sharp in nature.  Review of Systems  Positive: back pain Negative: Chest pain  Physical Exam  BP 108/64 (BP Location: Right Arm)   Pulse (!) 59   Temp 98.9 F (37.2 C) (Oral)   Resp 16   Ht 5\' 1"  (1.549 m)   Wt 94.8 kg   LMP 10/20/2022 (Approximate)   SpO2 100%   Breastfeeding No   BMI 39.49 kg/m  Gen:   Awake, no distress   Resp:  Normal effort  MSK:   Moves extremities without difficulty  Other:  TTP of paraspinal muscles, and L scapula, pain w/abduction, adduction of L arm.   Medical Decision Making  Medically screening exam initiated at 5:26 PM.  Appropriate orders placed.  YONG WAHLQUIST was informed that the remainder of the evaluation will be completed by another provider, this initial triage assessment does not replace that evaluation, and the importance of remaining in the ED until their evaluation is complete.     Osvaldo Shipper, [place] 11/01/22 1732

## 2022-11-02 ENCOUNTER — Emergency Department (HOSPITAL_COMMUNITY)
Admission: EM | Admit: 2022-11-02 | Discharge: 2022-11-03 | Disposition: A | Discharge status: Home / Self Care | Payer: Commercial Managed Care - HMO | Attending: Emergency Medicine | Admitting: Emergency Medicine

## 2022-11-02 ENCOUNTER — Emergency Department (HOSPITAL_COMMUNITY): Payer: Commercial Managed Care - HMO

## 2022-11-02 ENCOUNTER — Other Ambulatory Visit: Payer: Self-pay

## 2022-11-02 ENCOUNTER — Encounter (HOSPITAL_COMMUNITY): Payer: Self-pay

## 2022-11-02 DIAGNOSIS — R0781 Pleurodynia: Secondary | ICD-10-CM | POA: Insufficient documentation

## 2022-11-02 DIAGNOSIS — M25512 Pain in left shoulder: Secondary | ICD-10-CM | POA: Insufficient documentation

## 2022-11-02 DIAGNOSIS — R791 Abnormal coagulation profile: Secondary | ICD-10-CM | POA: Insufficient documentation

## 2022-11-02 DIAGNOSIS — R0789 Other chest pain: Secondary | ICD-10-CM

## 2022-11-02 LAB — CBC WITH DIFFERENTIAL/PLATELET
Abs Immature Granulocytes: 0.01 10*3/uL (ref 0.00–0.07)
Basophils Absolute: 0 10*3/uL (ref 0.0–0.1)
Basophils Relative: 0 %
Eosinophils Absolute: 0 10*3/uL (ref 0.0–0.5)
Eosinophils Relative: 1 %
HCT: 47.1 % — ABNORMAL HIGH (ref 36.0–46.0)
Hemoglobin: 15 g/dL (ref 12.0–15.0)
Immature Granulocytes: 0 %
Lymphocytes Relative: 38 %
Lymphs Abs: 2.3 10*3/uL (ref 0.7–4.0)
MCH: 30.3 pg (ref 26.0–34.0)
MCHC: 31.8 g/dL (ref 30.0–36.0)
MCV: 95.2 fL (ref 80.0–100.0)
Monocytes Absolute: 0.4 10*3/uL (ref 0.1–1.0)
Monocytes Relative: 7 %
Neutro Abs: 3.2 10*3/uL (ref 1.7–7.7)
Neutrophils Relative %: 54 %
Platelets: 243 10*3/uL (ref 150–400)
RBC: 4.95 MIL/uL (ref 3.87–5.11)
RDW: 12.7 % (ref 11.5–15.5)
WBC: 5.9 10*3/uL (ref 4.0–10.5)
nRBC: 0 % (ref 0.0–0.2)

## 2022-11-02 LAB — BASIC METABOLIC PANEL
Anion gap: 8 (ref 5–15)
BUN: 13 mg/dL (ref 6–20)
CO2: 24 mmol/L (ref 22–32)
Calcium: 9.3 mg/dL (ref 8.9–10.3)
Chloride: 108 mmol/L (ref 98–111)
Creatinine, Ser: 0.74 mg/dL (ref 0.44–1.00)
GFR, Estimated: >60 mL/min (ref >60)
Glucose, Bld: 63 mg/dL — ABNORMAL LOW (ref 70–99)
Potassium: 3.8 mmol/L (ref 3.5–5.1)
Sodium: 140 mmol/L (ref 135–145)

## 2022-11-02 LAB — TROPONIN I (HIGH SENSITIVITY): Troponin I (High Sensitivity): 3 ng/L (ref <18)

## 2022-11-02 LAB — D-DIMER, QUANTITATIVE: D-Dimer, Quant: 0.56 ug/mL-FEU — ABNORMAL HIGH (ref 0.00–0.50)

## 2022-11-02 MED ORDER — TRAMADOL HCL 50 MG PO TABS: 50.0000 mg | ORAL_TABLET | Freq: Four times a day (QID) | ORAL | 0 refills | Status: DC | PRN

## 2022-11-02 MED ORDER — MORPHINE SULFATE (PF) 4 MG/ML IV SOLN
4.0000 mg | Freq: Once | INTRAVENOUS | Status: AC
  Administered 2022-11-02: 4 mg via INTRAVENOUS
  Filled 2022-11-02: qty 1

## 2022-11-02 MED ORDER — IOHEXOL 350 MG/ML SOLN
100.0000 mL | Freq: Once | INTRAVENOUS | Status: AC | PRN
  Administered 2022-11-02: 100 mL via INTRAVENOUS

## 2022-11-02 MED ORDER — CYCLOBENZAPRINE HCL 5 MG PO TABS: 5.0000 mg | ORAL_TABLET | Freq: Three times a day (TID) | ORAL | 0 refills | Status: DC

## 2022-11-02 MED ORDER — CYCLOBENZAPRINE HCL 10 MG PO TABS
5.0000 mg | ORAL_TABLET | Freq: Once | ORAL | Status: AC
  Administered 2022-11-02: 5 mg via ORAL
  Filled 2022-11-02: qty 1

## 2022-11-02 NOTE — ED Provider Notes (Signed)
[facility] DEPT Provider Note   CSN: [id number] Arrival date & time: 11/02/22  1511     History  Chief Complaint  Patient presents with   Shoulder Pain    Claudia Miller is a 31 y.o. female here presenting with left-sided chest pain and shoulder pain.  Patient states that for the last 2 to 3 weeks she began having left-sided rib pain and shoulder pain. She states that it became progressively worse and became pleuritic in nature.  Patient went to [facility] yesterday and was given muscle relaxant but left without being seen from triage.  Patient had a negative UCG test and a unremarkable shoulder x-ray at that time.  Patient has persistent pleuritic chest pain.  Patient is here for further evaluation.  Denies any trauma or injury.  The history is provided by the patient.       Home Medications Prior to Admission medications   Medication Sig Start Date End Date Taking? Authorizing Provider  acetaminophen (TYLENOL) 325 MG tablet Take 2 tablets (650 mg total) by mouth every 4 (four) hours. 11/15/20   Layla Barter, MD  coconut oil OIL Apply 1 application topically as needed. 11/15/20   Layla Barter, MD  ibuprofen (ADVIL) 600 MG tablet Take 1 tablet (600 mg total) by mouth every 6 (six) hours. 11/15/20   Layla Barter, MD      Allergies    Fish allergy    Review of Systems   Review of Systems  Respiratory:  Positive for shortness of breath.   All other systems reviewed and are negative.   Physical Exam Updated Vital Signs BP (!) 114/59   Pulse (!) 57   Temp 98.7 F (37.1 C) (Oral)   Resp 16   Ht 5\' 1"  (1.549 m)   Wt 85.7 kg   LMP 10/20/2022 (Approximate)   SpO2 99%   BMI 35.71 kg/m  Physical Exam Vitals and nursing note reviewed.  Constitutional:      Comments: Uncomfortable  HENT:     Head: Normocephalic.     Nose: Nose normal.     Mouth/Throat:     Mouth: Mucous membranes are moist.  Eyes:     Extraocular Movements:  Extraocular movements intact.     Pupils: Pupils are equal, round, and reactive to light.  Cardiovascular:     Rate and Rhythm: Normal rate and regular rhythm.     Pulses: Normal pulses.     Heart sounds: Normal heart sounds.  Pulmonary:     Comments: Mild reproducible L chest wall tenderness, no obvious shingles  Abdominal:     General: Abdomen is flat.     Palpations: Abdomen is soft.  Musculoskeletal:        General: Normal range of motion.     Cervical back: Normal range of motion and neck supple.  Skin:    General: Skin is warm.     Capillary Refill: Capillary refill takes less than 2 seconds.  Neurological:     General: No focal deficit present.     Mental Status: She is oriented to person, place, and time.  Psychiatric:        Mood and Affect: Mood normal.        Behavior: Behavior normal.     ED Results / Procedures / Treatments   Labs (all labs ordered are listed, but only abnormal results are displayed) Labs Reviewed  CBC WITH DIFFERENTIAL/PLATELET - Abnormal; Notable for the following components:  Result Value   HCT 47.1 (*)    All other components within normal limits  D-DIMER, QUANTITATIVE - Abnormal; Notable for the following components:   D-Dimer, Quant 0.56 (*)    All other components within normal limits  BASIC METABOLIC PANEL  TROPONIN I (HIGH SENSITIVITY)    EKG None  Radiology DG Ribs Unilateral W/Chest Left  Result Date: 11/02/2022 CLINICAL DATA:  Left rib pain. EXAM: LEFT RIBS AND CHEST - 3+ VIEW COMPARISON:  None Available. FINDINGS: No fracture or other bone lesions are seen involving the ribs. There is no evidence of pneumothorax or pleural effusion. Both lungs are clear. Heart size and mediastinal contours are within normal limits. IMPRESSION: Negative radiographs of the chest and left ribs. Electronically Signed   By: Narda Rutherford M.D.   On: 11/02/2022 21:55   DG Shoulder Left  Result Date: 11/01/2022 CLINICAL DATA:  Left shoulder  pain. EXAM: LEFT SHOULDER - 2+ VIEW COMPARISON:  None Available. FINDINGS: There is no evidence of fracture or dislocation. There is no evidence of arthropathy or other focal bone abnormality. Soft tissues are unremarkable. IMPRESSION: Negative. Electronically Signed   By: Signa Kell M.D.   On: 11/01/2022 18:32    Procedures Procedures    Medications Ordered in ED Medications  cyclobenzaprine (FLEXERIL) tablet 5 mg (5 mg Oral Given 11/02/22 2158)  morphine (PF) 4 MG/ML injection 4 mg (4 mg Intravenous Given 11/02/22 2159)    ED Course/ Medical Decision Making/ A&P                           Medical Decision Making Claudia Miller is a 31 y.o. female here presenting with pleuritic chest pain.  Low risk for PE so we will get a D-dimer.  I do not see any signs of shingles.  I still think likely muscle strain.  We will get rib x-rays and labs and D-dimer.  10:22 PM D- dimer positive. CT chest ordered.  11:31 PM Reviewed patient's labs and troponin is negative.  D-dimer is elevated.  X-ray did not show any fractures.  CTA still pending.  Anticipate discharge home with tramadol and Flexeril if CTA did not show a PE.  Signed out to Dr. Read Drivers in the ED   Amount and/or Complexity of Data Reviewed Labs: ordered. Decision-making details documented in ED Course. Radiology: ordered. ECG/medicine tests: ordered and independent interpretation performed. Decision-making details documented in ED Course.  Risk Prescription drug management.    Final Clinical Impression(s) / ED Diagnoses Final diagnoses:  None    Rx / DC Orders ED Discharge Orders     None         Charlynne Pander, MD 11/02/22 2332

## 2022-11-02 NOTE — ED Triage Notes (Signed)
Pt to er, pt states that about 2-3 weeks ago she started having some back pain/ L shoulder pain, states that last night it was significantly worse, states that it felt like needles were poking her, states that last night she was given a muscle relaxer and some pain med and this helped, but now the pain is back.

## 2022-11-02 NOTE — ED Provider Notes (Signed)
Nursing notes and vitals signs, including pulse oximetry, reviewed.  Summary of this visit's results, reviewed by myself:  EKG:  EKG Interpretation  Date/Time:    Ventricular Rate:    PR Interval:    QRS Duration:   QT Interval:    QTC Calculation:   R Axis:     Text Interpretation:          Labs:  Results for orders placed or performed during the hospital encounter of 11/02/22 (from the past 24 hour(s))  CBC with Differential     Status: Abnormal   Collection Time: 11/02/22  9:25 PM  Result Value Ref Range   WBC 5.9 4.0 - 10.5 K/uL   RBC 4.95 3.87 - 5.11 MIL/uL   Hemoglobin 15.0 12.0 - 15.0 g/dL   HCT 46.2 (H) 70.3 - 50.0 %   MCV 95.2 80.0 - 100.0 fL   MCH 30.3 26.0 - 34.0 pg   MCHC 31.8 30.0 - 36.0 g/dL   RDW 93.8 18.2 - 99.3 %   Platelets 243 150 - 400 K/uL   nRBC 0.0 0.0 - 0.2 %   Neutrophils Relative % 54 %   Neutro Abs 3.2 1.7 - 7.7 K/uL   Lymphocytes Relative 38 %   Lymphs Abs 2.3 0.7 - 4.0 K/uL   Monocytes Relative 7 %   Monocytes Absolute 0.4 0.1 - 1.0 K/uL   Eosinophils Relative 1 %   Eosinophils Absolute 0.0 0.0 - 0.5 K/uL   Basophils Relative 0 %   Basophils Absolute 0.0 0.0 - 0.1 K/uL   Immature Granulocytes 0 %   Abs Immature Granulocytes 0.01 0.00 - 0.07 K/uL  Basic metabolic panel     Status: Abnormal   Collection Time: 11/02/22  9:25 PM  Result Value Ref Range   Sodium 140 135 - 145 mmol/L   Potassium 3.8 3.5 - 5.1 mmol/L   Chloride 108 98 - 111 mmol/L   CO2 24 22 - 32 mmol/L   Glucose, Bld 63 (L) 70 - 99 mg/dL   BUN 13 6 - 20 mg/dL   Creatinine, Ser 7.16 0.44 - 1.00 mg/dL   Calcium 9.3 8.9 - 96.7 mg/dL   GFR, Estimated >89 >38 mL/min   Anion gap 8 5 - 15  Troponin I (High Sensitivity)     Status: None   Collection Time: 11/02/22  9:25 PM  Result Value Ref Range   Troponin I (High Sensitivity) 3 <18 ng/L  D-dimer, quantitative     Status: Abnormal   Collection Time: 11/02/22  9:25 PM  Result Value Ref Range   D-Dimer, Quant 0.56 (H)  0.00 - 0.50 ug/mL-FEU    Imaging Studies: CT Angio Chest PE W and/or Wo Contrast  Result Date: 11/02/2022 CLINICAL DATA:  Worsening back and left shoulder pain; PE suspected, high probability EXAM: CT ANGIOGRAPHY CHEST WITH CONTRAST TECHNIQUE: Multidetector CT imaging of the chest was performed using the standard protocol during bolus administration of intravenous contrast. Multiplanar CT image reconstructions and MIPs were obtained to evaluate the vascular anatomy. RADIATION DOSE REDUCTION: This exam was performed according to the departmental dose-optimization program which includes automated exposure control, adjustment of the mA and/or kV according to patient size and/or use of iterative reconstruction technique. CONTRAST:  OMNIPAQUE IOHEXOL 350 MG/ML SOLN COMPARISON:  Radiographs 11/02/2022 and CT chest angiogram 11/28/2016 FINDINGS: Cardiovascular: Satisfactory opacification of the pulmonary arteries to the segmental level. No evidence of pulmonary embolism. Normal heart size. No pericardial effusion. Mediastinum/Nodes: No enlarged mediastinal,  hilar, or axillary lymph nodes. Thyroid gland, trachea, and esophagus demonstrate no significant findings. Lungs/Pleura: Mild asymmetric pleural thickening and subpleural reticular/ground-glass opacities in the posterior right lower lobe (circa series 5/images 113-183). Lungs are otherwise clear. No pleural effusion or pneumothorax. Upper Abdomen: No acute abnormality. Musculoskeletal: No chest wall abnormality. No acute osseous findings. Review of the MIP images confirms the above findings. IMPRESSION: Negative for pulmonary embolism. Questionable pleural thickening with subpleural reticular/ground-glass opacities in the posterior right lower lobe which are of questionable significance but could be posttraumatic or infectious/inflammatory. Recommend clinical correlation for site of pain. Electronically Signed   By: Placido Sou M.D.   On: 11/02/2022  23:50   DG Ribs Unilateral W/Chest Left  Result Date: 11/02/2022 CLINICAL DATA:  Left rib pain. EXAM: LEFT RIBS AND CHEST - 3+ VIEW COMPARISON:  None Available. FINDINGS: No fracture or other bone lesions are seen involving the ribs. There is no evidence of pneumothorax or pleural effusion. Both lungs are clear. Heart size and mediastinal contours are within normal limits. IMPRESSION: Negative radiographs of the chest and left ribs. Electronically Signed   By: Keith Rake M.D.   On: 11/02/2022 21:55   DG Shoulder Left  Result Date: 11/01/2022 CLINICAL DATA:  Left shoulder pain. EXAM: LEFT SHOULDER - 2+ VIEW COMPARISON:  None Available. FINDINGS: There is no evidence of fracture or dislocation. There is no evidence of arthropathy or other focal bone abnormality. Soft tissues are unremarkable. IMPRESSION: Negative. Electronically Signed   By: Kerby Moors M.D.   On: 11/01/2022 18:32    The patient's pain is in the left shoulder and does not correlate with the right lower lobe findings on chest CT.  I suspect those findings are representative of an old process.   Shanon Rosser, MD 11/02/22 2356

## 2022-11-02 NOTE — Discharge Instructions (Addendum)
You likely have chest wall pain.  Continue taking Motrin for pain and Flexeril for muscle spasms  I have sent in the prescription of tramadol for severe pain  See your doctor for follow-up  Return to ER if you have worse chest pain, trouble breathing

## 2023-07-07 ENCOUNTER — Encounter (HOSPITAL_COMMUNITY): Payer: Self-pay

## 2023-07-07 ENCOUNTER — Other Ambulatory Visit: Payer: Self-pay

## 2023-07-07 ENCOUNTER — Emergency Department (HOSPITAL_COMMUNITY)
Admission: EM | Admit: 2023-07-07 | Discharge: 2023-07-07 | Disposition: A | Discharge status: Home / Self Care | Payer: BLUE CROSS/BLUE SHIELD | Attending: Emergency Medicine | Admitting: Emergency Medicine

## 2023-07-07 ENCOUNTER — Emergency Department (HOSPITAL_COMMUNITY): Payer: BLUE CROSS/BLUE SHIELD

## 2023-07-07 DIAGNOSIS — S86912A Strain of unspecified muscle(s) and tendon(s) at lower leg level, left leg, initial encounter: Secondary | ICD-10-CM | POA: Insufficient documentation

## 2023-07-07 DIAGNOSIS — S838X2A Sprain of other specified parts of left knee, initial encounter: Secondary | ICD-10-CM | POA: Diagnosis not present

## 2023-07-07 DIAGNOSIS — S8992XA Unspecified injury of left lower leg, initial encounter: Secondary | ICD-10-CM | POA: Diagnosis present

## 2023-07-07 DIAGNOSIS — X500XXA Overexertion from strenuous movement or load, initial encounter: Secondary | ICD-10-CM | POA: Insufficient documentation

## 2023-07-07 DIAGNOSIS — M25562 Pain in left knee: Secondary | ICD-10-CM | POA: Diagnosis not present

## 2023-07-07 MED ORDER — LIDOCAINE 5 % EX PTCH
1.0000 | MEDICATED_PATCH | CUTANEOUS | Status: DC
  Administered 2023-07-07: 1 via TRANSDERMAL
  Filled 2023-07-07: qty 1

## 2023-07-07 MED ORDER — ONDANSETRON HCL 4 MG/2ML IJ SOLN
4.0000 mg | Freq: Once | INTRAMUSCULAR | Status: AC
  Administered 2023-07-07: 4 mg via INTRAVENOUS
  Filled 2023-07-07: qty 2

## 2023-07-07 MED ORDER — HYDROCODONE-ACETAMINOPHEN 5-325 MG PO TABS
1.0000 | ORAL_TABLET | Freq: Once | ORAL | Status: AC
  Administered 2023-07-07: 1 via ORAL
  Filled 2023-07-07: qty 1

## 2023-07-07 MED ORDER — CYCLOBENZAPRINE HCL 10 MG PO TABS
5.0000 mg | ORAL_TABLET | Freq: Once | ORAL | Status: AC
  Administered 2023-07-07: 5 mg via ORAL
  Filled 2023-07-07: qty 1

## 2023-07-07 MED ORDER — MORPHINE SULFATE (PF) 4 MG/ML IV SOLN
4.0000 mg | Freq: Once | INTRAVENOUS | Status: AC
  Administered 2023-07-07: 4 mg via INTRAVENOUS
  Filled 2023-07-07: qty 1

## 2023-07-07 MED ORDER — OXYCODONE-ACETAMINOPHEN 5-325 MG PO TABS: 1.0000 | ORAL_TABLET | Freq: Four times a day (QID) | ORAL | 0 refills | Status: DC | PRN

## 2023-07-07 MED ORDER — LIDOCAINE 5 % EX PTCH: 1.0000 | MEDICATED_PATCH | CUTANEOUS | 0 refills | Status: DC

## 2023-07-07 MED ORDER — CYCLOBENZAPRINE HCL 10 MG PO TABS: 10.0000 mg | ORAL_TABLET | Freq: Two times a day (BID) | ORAL | 0 refills | Status: DC | PRN

## 2023-07-07 MED ORDER — NAPROXEN 500 MG PO TABS: 500.0000 mg | ORAL_TABLET | Freq: Two times a day (BID) | ORAL | 0 refills | Status: DC

## 2023-07-07 NOTE — Discharge Instructions (Addendum)
It was a pleasure taking care of you in the emergency department  You most likely had a dislocation of the patella which is the round part of your knee.  The x-rays today showed this is back in place.  It is normal to feel a loosening sensation when you have certain movements due to sprain and strain of the muscle tendons and ligaments.  I recommend keeping the knee immobilizer on at all times over the next few days.  May use crutches as well.  Take medication as prescribed.  Please use caution as when these medications is a controlled substance and does have the addictive potential.  Do not drive or operate heavy machinery while taking this medication.  Follow-up with orthopedics if your symptoms do not improve if you do not have 1 I have listed on your discharge paperwork.  You will need to call to schedule appointment.  Return for new or worsening symptoms.

## 2023-07-07 NOTE — ED Provider Notes (Signed)
[place] EMERGENCY DEPARTMENT AT [facility] Provider Note   CSN: [id number] Arrival date & time: 07/07/23  1813    History  Chief Complaint  Patient presents with   Knee Injury    Claudia Miller is a 32 y.o. female for evaluation of left knee pain.  Yesterday evening rolled over and felt her left patella pop out of place.  Subsequently went back on its own.  Again today went to roll over and felt her left knee "pop."  Was not able to get it back into place according to patient.  Pain to the lateral aspect left knee.  She has been placing ice.  No significant swelling, redness.  Had some tingling down her leg.  No recent falls or injuries.  No pain when she is still, hurts when she moves the leg.   HPI     Home Medications Prior to Admission medications   Medication Sig Start Date End Date Taking? Authorizing Provider  cyclobenzaprine (FLEXERIL) 10 MG tablet Take 1 tablet (10 mg total) by mouth 2 (two) times daily as needed for muscle spasms. 07/07/23  Yes Javaughn Opdahl A, PA-C  lidocaine (LIDODERM) 5 % Place 1 patch onto the skin daily. Remove & Discard patch within 12 hours or as directed by MD 07/07/23  Yes Latricia Cerrito A, PA-C  naproxen (NAPROSYN) 500 MG tablet Take 1 tablet (500 mg total) by mouth 2 (two) times daily. 07/07/23  Yes Allon Costlow A, PA-C  oxyCODONE-acetaminophen (PERCOCET/ROXICET) 5-325 MG tablet Take 1 tablet by mouth every 6 (six) hours as needed for severe pain. 07/07/23  Yes Yardley Lekas A, PA-C      Allergies    Fish allergy    Review of Systems   Review of Systems  Constitutional: Negative.   HENT: Negative.    Respiratory: Negative.    Cardiovascular: Negative.   Gastrointestinal: Negative.   Genitourinary: Negative.   Musculoskeletal:        Left knee pain  Skin: Negative.   Neurological:        Tingling  All other systems reviewed and are negative.   Physical Exam Updated Vital Signs BP (!) 104/56   Pulse 60    Temp 97.7 F (36.5 C) (Oral)   Resp 18   Ht 5\' 1"  (1.549 m)   Wt 88.5 kg   LMP 06/08/2023   SpO2 100%   BMI 36.84 kg/m  Physical Exam Vitals and nursing note reviewed.  Constitutional:      General: She is not in acute distress.    Appearance: She is well-developed. She is not ill-appearing, toxic-appearing or diaphoretic.  HENT:     Head: Normocephalic and atraumatic.  Eyes:     Pupils: Pupils are equal, round, and reactive to light.  Cardiovascular:     Rate and Rhythm: Normal rate.     Pulses: Normal pulses.          Radial pulses are 2+ on the left side.       Popliteal pulses are 2+ on the left side.       Dorsalis pedis pulses are 2+ on the left side.       Posterior tibial pulses are 2+ on the left side.  Pulmonary:     Effort: No respiratory distress.  Abdominal:     General: There is no distension.  Musculoskeletal:        General: Normal range of motion.     Cervical back: Normal range of  motion.     Right lower leg: No edema.     Left lower leg: No edema.     Comments: Diffuse tenderness to patella, lateral aspect left knee.  Leg held in a flexed position at knee.  No bony tenderness to midshaft, distal tib-fib, femur.  She is able to straight leg raise.  Wiggles toes without difficulty.  Moves bilateral upper extremities  Skin:    General: Skin is warm and dry.     Capillary Refill: Capillary refill takes less than 2 seconds.     Comments: No edema, erythema or warmth.  No fluctuance or induration.  Neurological:     General: No focal deficit present.     Mental Status: She is alert.     Sensory: Sensation is intact.     Motor: Motor function is intact.     Comments: Intact sensation, equal strength  Psychiatric:        Mood and Affect: Mood normal.     ED Results / Procedures / Treatments   Labs (all labs ordered are listed, but only abnormal results are displayed) Labs Reviewed - No data to display  EKG None  Radiology DG Knee Complete 4 Views  Left  Result Date: 07/07/2023 CLINICAL DATA:  Pain, injury. EXAM: LEFT KNEE - COMPLETE 4+ VIEW COMPARISON:  None Available. FINDINGS: No evidence of fracture, dislocation, or joint effusion. No evidence of arthropathy or other focal bone abnormality. Soft tissues are unremarkable. IMPRESSION: Negative. Electronically Signed   By: Darliss Cheney M.D.   On: 07/07/2023 19:10    Procedures .Ortho Injury Treatment  Date/Time: 07/07/2023 8:22 PM  Performed by: Linwood Dibbles, PA-C Authorized by: Linwood Dibbles, PA-C   Consent:    Consent obtained:  Verbal   Consent given by:  Patient   Risks discussed:  Fracture, nerve damage, stiffness, restricted joint movement, vascular damage, recurrent dislocation and irreducible dislocation   Alternatives discussed:  No treatment, alternative treatment, immobilization, referral and delayed treatmentInjury location: knee Location details: left knee Injury type: dislocation Dislocation type: lateral patellar Pre-procedure neurovascular assessment: neurovascularly intact Pre-procedure distal perfusion: normal Pre-procedure neurological function: normal Pre-procedure range of motion: normal  Anesthesia: Local anesthesia used: no  Patient sedated: NoManipulation performed: no Immobilization: crutches (knee immobilizer) Splint Applied by: Milon Dikes Post-procedure neurovascular assessment: post-procedure neurovascularly intact Post-procedure distal perfusion: normal Post-procedure neurological function: normal Post-procedure range of motion: normal       Medications Ordered in ED Medications  lidocaine (LIDODERM) 5 % 1 patch (1 patch Transdermal Patch Applied 07/07/23 2003)  morphine (PF) 4 MG/ML injection 4 mg (4 mg Intravenous Given 07/07/23 1845)  ondansetron (ZOFRAN) injection 4 mg (4 mg Intravenous Given 07/07/23 1845)  cyclobenzaprine (FLEXERIL) tablet 5 mg (5 mg Oral Given 07/07/23 2004)  HYDROcodone-acetaminophen (NORCO/VICODIN) 5-325  MG per tablet 1 tablet (1 tablet Oral Given 07/07/23 2004)    ED Course/ Medical Decision Making/ A&P   32 year old here for evaluation of possible left patellar dislocation.  Initially occurred yesterday when she rolled over, subsequently reduced on its own.  This evening, moved in a certain direction felt like her patella dislocated.  She had immediate pain, tingling from the knee distally.  She did not fall to the ground.  Arrival she is neurovascularly intact.  No erythema or warmth.  Patella is slightly lateral from midline.  Knee held in a slightly flexed position.  Patient was given pain management, subsequently able to extend the leg.  She can straight leg  raise without difficulty, low suspicion for patellar tendon rupture.  She has no effusion low suspicion for septic joint, hemarthrosis.  No bony tenderness to midshaft, distal tib-fib, femur.  Her compartments are soft, nontender to calves I low suspicion for VTE, ischemia.  Low suspicion for tibial dislocation with subsequent popliteal artery injury.  Will plan on checking x-ray.  Imaging personally viewed and interpreted:  No fracture, dislocation, effusion  Question sprain or strain after possible patellar subluxation.  She was placed in a knee immobilizer and provided crutches.  Given short course of muscle relaxers, pain medication and anti-inflammatories.  She will need follow-up with orthopedics.  At this time a low suspicion for infectious process, vascular injury, neurosurgical emergency, occult fracture, dislocation, VTE, claudication.  The patient has been appropriately medically screened and/or stabilized in the ED. I have low suspicion for any other emergent medical condition which would require further screening, evaluation or treatment in the ED or require inpatient management.  Patient is hemodynamically stable and in no acute distress.  Patient able to ambulate in department prior to ED.  Evaluation does not show acute  pathology that would require ongoing or additional emergent interventions while in the emergency department or further inpatient treatment.  I have discussed the diagnosis with the patient and answered all questions.  Pain is been managed while in the emergency department and patient has no further complaints prior to discharge.  Patient is comfortable with plan discussed in room and is stable for discharge at this time.  I have discussed strict return precautions for returning to the emergency department.  Patient was encouraged to follow-up with PCP/specialist refer to at discharge.                              Medical Decision Making Amount and/or Complexity of Data Reviewed External Data Reviewed: labs, radiology and notes. Radiology: ordered and independent interpretation performed. Decision-making details documented in ED Course.  Risk OTC drugs. Prescription drug management. Parenteral controlled substances. Decision regarding hospitalization. Diagnosis or treatment significantly limited by social determinants of health.          Final Clinical Impression(s) / ED Diagnoses Final diagnoses:  Strain of left knee, initial encounter    Rx / DC Orders ED Discharge Orders          Ordered    oxyCODONE-acetaminophen (PERCOCET/ROXICET) 5-325 MG tablet  Every 6 hours PRN        07/07/23 2107    cyclobenzaprine (FLEXERIL) 10 MG tablet  2 times daily PRN        07/07/23 2107    naproxen (NAPROSYN) 500 MG tablet  2 times daily        07/07/23 2107    lidocaine (LIDODERM) 5 %  Every 24 hours        07/07/23 2107              Stillman Buenger A, PA-C 07/07/23 2108    Linwood Dibbles, MD 07/08/23 1610

## 2023-07-07 NOTE — ED Triage Notes (Signed)
Patient brought in by EMS due to possible dislocated left knee. Reports yesterday she was laying down, went to roll over and it popped out of place, was able to pop it back in place yesterday. Same thing happened today except family was unable to pop the knee back in place. Reports this happened around 1700 today.

## 2023-07-07 NOTE — Progress Notes (Signed)
Orthopedic Tech Progress Note Patient Details:  Claudia Miller 1991-03-11 [id number]  Ortho Devices Type of Ortho Device: Knee Immobilizer, Crutches Ortho Device/Splint Location: left knee immobilizer. crutches sized and instructed on use Ortho Device/Splint Interventions: Ordered, Application, Adjustment   Post Interventions Patient Tolerated: Fair, Difficulty with ambulation Instructions Provided: Care of device, Adjustment of device  Kizzie Fantasia 07/07/2023, 8:02 PM

## 2023-07-12 ENCOUNTER — Other Ambulatory Visit: Payer: Self-pay

## 2023-07-12 ENCOUNTER — Emergency Department (HOSPITAL_COMMUNITY)
Admission: EM | Admit: 2023-07-12 | Discharge: 2023-07-12 | Disposition: A | Discharge status: Home / Self Care | Payer: BLUE CROSS/BLUE SHIELD | Attending: Emergency Medicine | Admitting: Emergency Medicine

## 2023-07-12 ENCOUNTER — Emergency Department (HOSPITAL_COMMUNITY): Payer: BLUE CROSS/BLUE SHIELD

## 2023-07-12 DIAGNOSIS — R209 Unspecified disturbances of skin sensation: Secondary | ICD-10-CM | POA: Insufficient documentation

## 2023-07-12 DIAGNOSIS — M79662 Pain in left lower leg: Secondary | ICD-10-CM | POA: Diagnosis not present

## 2023-07-12 DIAGNOSIS — M25562 Pain in left knee: Secondary | ICD-10-CM | POA: Diagnosis present

## 2023-07-12 NOTE — Progress Notes (Signed)
LLE venous duplex has been completed.  Preliminary results given to [facility], PA-C.   Results can be found under chart review under CV PROC. 07/12/2023 7:55 PM Livian Vanderbeck RVT, RDMS

## 2023-07-12 NOTE — Discharge Instructions (Signed)
It was a pleasure caring for you today.  Ultrasound was not concerning for DVT at this time.  I recommend following up with your orthopedic appointment in 3 days.  Seek emergency care if experiencing any new or worsening symptoms.  Alternating between 650 mg Tylenol and 400 mg Advil: The best way to alternate taking Acetaminophen (example Tylenol) and Ibuprofen (example Advil/Motrin) is to take them 3 hours apart. For example, if you take ibuprofen at 6 am you can then take Tylenol at 9 am. You can continue this regimen throughout the day, making sure you do not exceed the recommended maximum dose for each drug.

## 2023-07-12 NOTE — ED Provider Notes (Signed)
[place] [facility] AT [facility] Provider Note   CSN: [id number] Arrival date & time: 07/12/23  1613     History  Chief Complaint  Patient presents with   Knee Pain    Claudia Miller is a 32 y.o. female with Pmhx DVT who presents to ED concerned for left knee pain. Patient states she got in car and felt something pop 1 week ago. Also endorsing intermittent mild "tingling" of toes. Patient seen for same in ED 6 days ago and stating that symptoms are slowly resolving since then. Also endorsing appointment with orthopedics in 3 days.   Denies fever, chest pain, dyspnea, nausea, vomiting, abdominal pain. Denies recent surgeries, hx DVT/PE, OCP, hemoptysis.   Knee Pain      Home Medications Prior to Admission medications   Medication Sig Start Date End Date Taking? Authorizing Provider  cyclobenzaprine (FLEXERIL) 10 MG tablet Take 1 tablet (10 mg total) by mouth 2 (two) times daily as needed for muscle spasms. 07/07/23   Henderly, Britni A, PA-C  lidocaine (LIDODERM) 5 % Place 1 patch onto the skin daily. Remove & Discard patch within 12 hours or as directed by MD 07/07/23   Henderly, Britni A, PA-C  naproxen (NAPROSYN) 500 MG tablet Take 1 tablet (500 mg total) by mouth 2 (two) times daily. 07/07/23   Henderly, Britni A, PA-C  oxyCODONE-acetaminophen (PERCOCET/ROXICET) 5-325 MG tablet Take 1 tablet by mouth every 6 (six) hours as needed for severe pain. 07/07/23   Henderly, Britni A, PA-C      Allergies    Fish allergy    Review of Systems   Review of Systems  Musculoskeletal:        Knee pain    Physical Exam Updated Vital Signs BP (!) 110/57 (BP Location: Left Arm)   Pulse 70   Temp 98.4 F (36.9 C) (Oral)   Resp 16   Ht 5\' 1"  (1.549 m)   Wt 88 kg   LMP 06/08/2023   SpO2 100%   BMI 36.66 kg/m  Physical Exam Vitals and nursing note reviewed.  Constitutional:      General: She is not in acute distress.    Appearance: She is not  ill-appearing or toxic-appearing.  HENT:     Head: Normocephalic and atraumatic.     Mouth/Throat:     Mouth: Mucous membranes are moist.  Eyes:     General: No scleral icterus.       Right eye: No discharge.        Left eye: No discharge.     Conjunctiva/sclera: Conjunctivae normal.  Cardiovascular:     Rate and Rhythm: Normal rate.     Pulses: Normal pulses.     Heart sounds: No murmur heard. Pulmonary:     Effort: Pulmonary effort is normal. No respiratory distress.     Breath sounds: No wheezing, rhonchi or rales.  Abdominal:     Tenderness: There is no abdominal tenderness.  Musculoskeletal:     Right lower leg: No edema.     Left lower leg: No edema.     Comments: +2 pedal pulse. No swelling, erythema, or warmth appreciated of left knee when compared to right. Pain with active ROM of left knee. Able to hold knee in extension - patellar tendon appears to be intact. Tenderness to palpation of anterior and posterior left knee.  Skin:    General: Skin is warm and dry.     Findings: No rash.  Neurological:  General: No focal deficit present.     Mental Status: She is alert. Mental status is at baseline.  Psychiatric:        Mood and Affect: Mood normal.        Behavior: Behavior normal.     ED Results / Procedures / Treatments   Labs (all labs ordered are listed, but only abnormal results are displayed) Labs Reviewed - No data to display  EKG None  Radiology VAS [place] LOWER EXTREMITY VENOUS (DVT) (7a-7p)  Result Date: 07/12/2023  Lower Venous DVT Study Patient Name:  Claudia Miller  Date of Exam:   07/12/2023 Medical Rec #: [id number]         Accession #:    [id number] Date of Birth: [birth year]        Patient Gender: F Patient Age:   57 years Exam Location:  [facility] Procedure:      VAS [place] LOWER EXTREMITY VENOUS (DVT) Referring Phys: [facility] Isatou Agredano --------------------------------------------------------------------------------  Indications: Left knee  pain. Other Indications: Recent knee injury (07/07/23) with continued pain. Comparison Study: Previous exam on 11/26/16 was negative for DVT Performing Technologist: Ernestene Mention RVT, RDMS  Examination Guidelines: A complete evaluation includes B-mode imaging, spectral Doppler, color Doppler, and power Doppler as needed of all accessible portions of each vessel. Bilateral testing is considered an integral part of a complete examination. Limited examinations for reoccurring indications may be performed as noted. The reflux portion of the exam is performed with the patient in reverse Trendelenburg.  +-----+---------------+---------+-----------+----------+--------------+ RIGHTCompressibilityPhasicitySpontaneityPropertiesThrombus Aging +-----+---------------+---------+-----------+----------+--------------+ CFV  Full           Yes      Yes                                 +-----+---------------+---------+-----------+----------+--------------+   +---------+---------------+---------+-----------+----------+--------------+ LEFT     CompressibilityPhasicitySpontaneityPropertiesThrombus Aging +---------+---------------+---------+-----------+----------+--------------+ CFV      Full           Yes      Yes                                 +---------+---------------+---------+-----------+----------+--------------+ SFJ      Full                                                        +---------+---------------+---------+-----------+----------+--------------+ FV Prox  Full           Yes      Yes                                 +---------+---------------+---------+-----------+----------+--------------+ FV Mid   Full           Yes      Yes                                 +---------+---------------+---------+-----------+----------+--------------+ FV DistalFull           Yes      Yes                                 +---------+---------------+---------+-----------+----------+--------------+  PFV      Full                                                        +---------+---------------+---------+-----------+----------+--------------+ POP      Full           Yes      Yes                                 +---------+---------------+---------+-----------+----------+--------------+ PTV      Full                                                        +---------+---------------+---------+-----------+----------+--------------+ PERO     Full                                                        +---------+---------------+---------+-----------+----------+--------------+    Summary: RIGHT: - No evidence of common femoral vein obstruction.  LEFT: - There is no evidence of deep vein thrombosis in the lower extremity.  - No cystic structure found in the popliteal fossa.  *See table(s) above for measurements and observations. Electronically signed by Coral Else MD on 07/12/2023 at 9:32:48 PM.    Final     Procedures Procedures    Medications Ordered in ED Medications - No data to display  ED Course/ Medical Decision Making/ A&P                             Medical Decision Making  This patient presents to the ED for concern of left knee pain, this involves an extensive number of treatment options, and is a complaint that carries with it a high risk of complications and morbidity.  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 I independently visualized and interpreted imaging Shared findings with patient I agree  with the radiologist interpretation    Problem List / ED Course / Critical interventions / Medication management  Patient presents emergency room concern for left knee pain.  Endorses hearing a pop 1 week ago.  X-ray obtained last week negative for fractures or dislocations.  Patient endorsing history of DVT in right leg in the past.  Given that patient is having left leg pain and intermittent tingling of her left toes-I obtained ultrasound of her left leg to assess for DVTs.  Patient denying any respiratory complaints.  Ultrasound negative for DVT.  Patient ambulatory. Physical exam without erythema, warmth, swelling of left knee with compared to right.  Left leg neurovascularly intact.  I believe patient's pain is due to a possible ligament rupture or sprain.  Patient endorsing orthopedic appointment in 3 days.  Recommended patient attend appointment and possibly obtain outpatient MRI.  Patient not meeting criteria today for an urgent MRI.  Patient verbally endorsed understanding of plan. Patient afebrile with stable vitals.  Provided with return precautions.  Discharged in good condition. I have reviewed the patients home medicines and have made adjustments as needed   Ddx these are considered less likely due to history of present illness and physical exam -hemarthrosis: joint without swelling; not on blood thinners -gout: no warmth or erythema -septic joint: afebrile; no warmth or erythema; no skin changes -fracture: xray without concern last week -compartment syndrome: area not tense; neurovascularly intact   Social Determinants of Health:  none          Final Clinical Impression(s) / ED Diagnoses Final diagnoses:  Acute pain of left knee    Rx / DC Orders ED Discharge Orders     None         Margarita Rana 07/12/23 2147    Wynetta Fines, MD 07/12/23 2246

## 2023-07-12 NOTE — ED Triage Notes (Signed)
Pt reports left knee pain states it popped while getting out of car.

## 2023-07-20 ENCOUNTER — Ambulatory Visit (HOSPITAL_COMMUNITY)
Admission: EM | Admit: 2023-07-20 | Discharge: 2023-07-20 | Disposition: A | Discharge status: Home / Self Care | Payer: BLUE CROSS/BLUE SHIELD | Attending: Emergency Medicine | Admitting: Emergency Medicine

## 2023-07-20 ENCOUNTER — Encounter (HOSPITAL_COMMUNITY): Payer: Self-pay

## 2023-07-20 DIAGNOSIS — S8392XA Sprain of unspecified site of left knee, initial encounter: Secondary | ICD-10-CM

## 2023-07-20 DIAGNOSIS — M25562 Pain in left knee: Secondary | ICD-10-CM | POA: Diagnosis not present

## 2023-07-20 MED ORDER — NAPROXEN 500 MG PO TABS: 500.0000 mg | ORAL_TABLET | Freq: Two times a day (BID) | ORAL | 0 refills | Status: DC

## 2023-07-20 MED ORDER — KETOROLAC TROMETHAMINE 30 MG/ML IJ SOLN
30.0000 mg | Freq: Once | INTRAMUSCULAR | Status: AC
  Administered 2023-07-20: 30 mg via INTRAMUSCULAR

## 2023-07-20 MED ORDER — KETOROLAC TROMETHAMINE 30 MG/ML IJ SOLN
INTRAMUSCULAR | Status: AC
  Filled 2023-07-20: qty 1

## 2023-07-20 MED ORDER — METHOCARBAMOL 500 MG PO TABS: 500.0000 mg | ORAL_TABLET | Freq: Two times a day (BID) | ORAL | 0 refills | Status: DC

## 2023-07-20 NOTE — Discharge Instructions (Addendum)
Please try to keep off your knee, continue with the immobilizer, anti-inflammatories, and you can trial the Robaxin.  All muscle relaxers do cause drowsiness, this may cause less drowsiness than the Flexeril.  We have given you an injection of Toradol in clinic, please start the naproxen tomorrow for pain and inflammation.   It is very important that you keep your orthopedic follow-up, there you will obtain further evaluation and advanced imaging as needed.

## 2023-07-20 NOTE — ED Provider Notes (Signed)
[facility]    CSN: [id number] Arrival date & time: 07/20/23  1503      History   Chief Complaint Chief Complaint  Patient presents with   Knee Pain    HPI Claudia Miller is a 32 y.o. female.   Patient presents to clinic for left knee pain that continues post injury.  She was seen at the emergency department on 7/11 for acute knee pain, suspect intermittent patellar dislocation that relocated itself.  Had negative x-rays.  Presented again to the emergency room for continued pain on 7/16 where she had negative DVT study.  Patient has been wearing her knee immobilizer and using a crutch.  Reports her pain continues despite medication and without the knee immobilizer her knee feels lax and loose.  When she tries to walk her feet swell.  She has been using the Percocet and muscle relaxer, reports both of these are ineffective and cause drowsiness.    The history is provided by the patient and medical records.  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 04/15/2020   Supervision of normal pregnancy, antepartum 07/12/2018    Nursing Staff Provider Office Location  [facility] Dating   [place] 6.4 on 06/04/18 Language  English  Anatomy US  Normal Flu Vaccine   08/15/18 Genetic Screen  NIPS: normal  TDaP vaccine   11/30/18 Hgb A1C or  GTT Early  Third trimester nl 2 hour  Rhogam   n/a   LAB RESULTS  Feeding Plan Bottle Blood Type A/Positive/-- (07/17 1211)  Contraception Depo Antibody Negative (07/17 1211) Circumcision  girl  [place]   Trichomoniasis 08/09/2018   [x]  TOC    Patient Active Problem List   Diagnosis Date Noted   High-risk pregnancy 11/13/2020   Obesity affecting pregnancy in third trimester, antepartum 05/27/2020   Supervision of high risk pregnancy, antepartum 05/20/2020   History of cesarean delivery, currently pregnant 01/23/2019    Past Surgical History:  Procedure Laterality Date   CESAREAN SECTION N/A  01/23/2019   Procedure: CESAREAN SECTION;  Surgeon: Tereso Newcomer, MD;  Location: [facility];  Service: Obstetrics;  Laterality: N/A;    OB History     Gravida  4   Para  4   Term  3   Preterm  1   AB  0   Living  4      SAB  0   IAB  0   Ectopic  0   Multiple  0   Live Births  4            Home Medications    Prior to Admission medications   Medication Sig Start Date End Date Taking? Authorizing Provider  methocarbamol (ROBAXIN) 500 MG tablet Take 1 tablet (500 mg total) by mouth 2 (two) times daily. 07/20/23  Yes Rinaldo Ratel, [place] N, FNP  naproxen (NAPROSYN) 500 MG tablet Take 1 tablet (500 mg total) by mouth 2 (two) times daily. 07/20/23  Yes Rinaldo Ratel, [place] N, FNP  oxyCODONE-acetaminophen (PERCOCET/ROXICET) 5-325 MG tablet Take 1 tablet by mouth every 6 (six) hours as needed for severe pain. 07/07/23  Yes Henderly, Britni A, PA-C  cyclobenzaprine (FLEXERIL) 10 MG tablet Take 1 tablet (10 mg total) by mouth 2 (two) times daily as needed for muscle spasms. 07/07/23   Henderly, Britni A, PA-C  lidocaine (LIDODERM) 5 % Place 1 patch onto the skin  daily. Remove & Discard patch within 12 hours or as directed by MD 07/07/23   Henderly, Britni A, PA-C    Family History Family History  Problem Relation Age of Onset   Asthma Mother    Diabetes Mother    Hypertension Mother    Cancer Father    Hypertension Father    Asthma Sister    Hypertension Sister    Asthma Brother    Hypertension Brother     Social History Social History   Tobacco Use   Smoking status: Former    Current packs/day: 0.00    Types: Cigarettes    Quit date: 05/31/2018    Years since quitting: 5.1   Smokeless tobacco: Never  Vaping Use   Vaping status: Never Used  Substance Use Topics   Alcohol use: No   Drug use: No     Allergies   Fish allergy   Review of Systems Review of Systems  Musculoskeletal:  Positive for gait problem and joint swelling.     Physical  Exam Triage Vital Signs ED Triage Vitals [07/20/23 1612]  Encounter Vitals Group     BP (!) 102/48     Systolic BP Percentile      Diastolic BP Percentile      Pulse Rate 61     Resp 16     Temp 98.7 F (37.1 C)     Temp Source Oral     SpO2 98 %     Weight      Height      Head Circumference      Peak Flow      Pain Score      Pain Loc      Pain Education      Exclude from Growth Chart    No data found.  Updated Vital Signs BP (!) 102/48 (BP Location: Left Arm)   Pulse 61   Temp 98.7 F (37.1 C) (Oral)   Resp 16   LMP 07/17/2023   SpO2 98%   Visual Acuity Right Eye Distance:   Left Eye Distance:   Bilateral Distance:    Right Eye Near:   Left Eye Near:    Bilateral Near:     Physical Exam Vitals and nursing note reviewed.  Constitutional:      Appearance: Normal appearance.  HENT:     Head: Normocephalic and atraumatic.     Right Ear: External ear normal.     Left Ear: External ear normal.     Nose: Nose normal.     Mouth/Throat:     Mouth: Mucous membranes are moist.  Eyes:     Conjunctiva/sclera: Conjunctivae normal.  Cardiovascular:     Rate and Rhythm: Normal rate.     Pulses: Normal pulses.  Pulmonary:     Effort: Pulmonary effort is normal. No respiratory distress.  Musculoskeletal:        General: Tenderness and signs of injury present.     Right lower leg: No edema.     Left lower leg: No edema.  Skin:    General: Skin is warm and dry.     Capillary Refill: Capillary refill takes less than 2 seconds.  Neurological:     General: No focal deficit present.     Mental Status: She is alert and oriented to person, place, and time.  Psychiatric:        Mood and Affect: Mood normal.        Behavior: Behavior normal.  UC Treatments / Results  Labs (all labs ordered are listed, but only abnormal results are displayed) Labs Reviewed - No data to display  EKG   Radiology No results found.  Procedures Procedures (including  critical care time)  Medications Ordered in UC Medications  ketorolac (TORADOL) 30 MG/ML injection 30 mg (has no administration in time range)    Initial Impression / Assessment and Plan / UC Course  I have reviewed the triage vital signs and the nursing notes.  Pertinent labs & imaging results that were available during my care of the patient were reviewed by me and considered in my medical decision making (see chart for details).  Vitals and triage reviewed, patient is hemodynamically stable.  Presents to clinic for continued left knee pain.  Patient reports intermittent patellar dislocation that relocated itself. She was seen in the ED and had negative x-rays, then negative DVT study a few days later.  Reports overall her numbness has improved.  Appears neurovascularly intact, no lower extremity edema at this time.  Still having pain, stiffness and swelling.  Orthopedic follow-up on Friday.  Work note provided until orthopedic follow-up.  Plan of care, follow-up care and return precautions given, no questions at this time.     Final Clinical Impressions(s) / UC Diagnoses   Final diagnoses:  Sprain of left knee, unspecified ligament, initial encounter  Acute pain of left knee     Discharge Instructions      Please try to keep off your knee, continue with the immobilizer, anti-inflammatories, and you can trial the Robaxin.  All muscle relaxers do cause drowsiness, this may cause less drowsiness than the Flexeril.  We have given you an injection of Toradol in clinic, please start the naproxen tomorrow for pain and inflammation.   It is very important that you keep your orthopedic follow-up, there you will obtain further evaluation and advanced imaging as needed.      ED Prescriptions     Medication Sig Dispense Auth. Provider   naproxen (NAPROSYN) 500 MG tablet Take 1 tablet (500 mg total) by mouth 2 (two) times daily. 30 tablet Rinaldo Ratel, [place] N, [place]   methocarbamol  (ROBAXIN) 500 MG tablet Take 1 tablet (500 mg total) by mouth 2 (two) times daily. 20 tablet Hurshell Dino, [place] N, [place]      PDMP not reviewed this encounter.   Hendryx Ricke, [place] N, [place] 07/20/23 [phone]

## 2023-07-20 NOTE — ED Triage Notes (Signed)
Pt is here for follow-up on LT knee pain. Pt reports she appt with Ortho on Friday but will need a note to returned to work. Pt has crutches and she is wearing a brace.

## 2023-07-22 DIAGNOSIS — S83005A Unspecified dislocation of left patella, initial encounter: Secondary | ICD-10-CM | POA: Diagnosis not present

## 2023-08-17 ENCOUNTER — Encounter (HOSPITAL_COMMUNITY): Payer: Self-pay | Admitting: Emergency Medicine

## 2023-08-17 ENCOUNTER — Inpatient Hospital Stay (HOSPITAL_COMMUNITY)
Admission: AD | Admit: 2023-08-17 | Discharge: 2023-08-18 | Disposition: A | Discharge status: Home / Self Care | Payer: BLUE CROSS/BLUE SHIELD | Attending: Obstetrics and Gynecology | Admitting: Obstetrics and Gynecology

## 2023-08-17 DIAGNOSIS — O26891 Other specified pregnancy related conditions, first trimester: Secondary | ICD-10-CM | POA: Insufficient documentation

## 2023-08-17 DIAGNOSIS — O3680X0 Pregnancy with inconclusive fetal viability, not applicable or unspecified: Secondary | ICD-10-CM

## 2023-08-17 DIAGNOSIS — Z3A09 9 weeks gestation of pregnancy: Secondary | ICD-10-CM | POA: Diagnosis not present

## 2023-08-17 DIAGNOSIS — O219 Vomiting of pregnancy, unspecified: Secondary | ICD-10-CM | POA: Insufficient documentation

## 2023-08-17 DIAGNOSIS — O99281 Endocrine, nutritional and metabolic diseases complicating pregnancy, first trimester: Secondary | ICD-10-CM | POA: Diagnosis not present

## 2023-08-17 DIAGNOSIS — O209 Hemorrhage in early pregnancy, unspecified: Secondary | ICD-10-CM | POA: Diagnosis present

## 2023-08-17 DIAGNOSIS — E860 Dehydration: Secondary | ICD-10-CM | POA: Insufficient documentation

## 2023-08-17 DIAGNOSIS — R109 Unspecified abdominal pain: Secondary | ICD-10-CM | POA: Diagnosis not present

## 2023-08-17 DIAGNOSIS — O26899 Other specified pregnancy related conditions, unspecified trimester: Secondary | ICD-10-CM

## 2023-08-17 DIAGNOSIS — R519 Headache, unspecified: Secondary | ICD-10-CM | POA: Diagnosis not present

## 2023-08-17 LAB — CBC
HCT: 40.4 % (ref 36.0–46.0)
Hemoglobin: 13.4 g/dL (ref 12.0–15.0)
MCH: 30.5 pg (ref 26.0–34.0)
MCHC: 33.2 g/dL (ref 30.0–36.0)
MCV: 91.8 fL (ref 80.0–100.0)
Platelets: 242 10*3/uL (ref 150–400)
RBC: 4.4 MIL/uL (ref 3.87–5.11)
RDW: 11.5 % (ref 11.5–15.5)
WBC: 7 10*3/uL (ref 4.0–10.5)
nRBC: 0 % (ref 0.0–0.2)

## 2023-08-17 LAB — COMPREHENSIVE METABOLIC PANEL
ALT: 15 U/L (ref 0–44)
AST: 16 U/L (ref 15–41)
Albumin: 3.7 g/dL (ref 3.5–5.0)
Alkaline Phosphatase: 44 U/L (ref 38–126)
Anion gap: 9 (ref 5–15)
BUN: 9 mg/dL (ref 6–20)
CO2: 20 mmol/L — ABNORMAL LOW (ref 22–32)
Calcium: 8.9 mg/dL (ref 8.9–10.3)
Chloride: 104 mmol/L (ref 98–111)
Creatinine, Ser: 0.73 mg/dL (ref 0.44–1.00)
GFR, Estimated: >60 mL/min (ref >60)
Glucose, Bld: 79 mg/dL (ref 70–99)
Potassium: 4 mmol/L (ref 3.5–5.1)
Sodium: 133 mmol/L — ABNORMAL LOW (ref 135–145)
Total Bilirubin: 0.4 mg/dL (ref 0.3–1.2)
Total Protein: 7.8 g/dL (ref 6.5–8.1)

## 2023-08-17 LAB — URINALYSIS, ROUTINE W REFLEX MICROSCOPIC
Bilirubin Urine: NEGATIVE
Glucose, UA: NEGATIVE mg/dL
Ketones, ur: NEGATIVE mg/dL
Nitrite: NEGATIVE
Protein, ur: NEGATIVE mg/dL
Specific Gravity, Urine: 1.023 (ref 1.005–1.030)
WBC, UA: >50 WBC/hpf (ref 0–5)
pH: 6 (ref 5.0–8.0)

## 2023-08-17 LAB — HCG, QUANTITATIVE, PREGNANCY: hCG, Beta Chain, Quant, S: >250000 m[IU]/mL — ABNORMAL HIGH (ref <5)

## 2023-08-17 LAB — POC URINE PREG, ED: Preg Test, Ur: POSITIVE — AB

## 2023-08-17 MED ORDER — DIPHENHYDRAMINE HCL 50 MG/ML IJ SOLN
25.0000 mg | Freq: Once | INTRAMUSCULAR | Status: AC
  Administered 2023-08-18: 25 mg via INTRAVENOUS
  Filled 2023-08-17: qty 1

## 2023-08-17 MED ORDER — DIPHENHYDRAMINE HCL 50 MG/ML IJ SOLN: 25.0000 mg | Freq: Once | INTRAMUSCULAR | Status: DC

## 2023-08-17 MED ORDER — METOCLOPRAMIDE HCL 5 MG/ML IJ SOLN: 10.0000 mg | Freq: Once | INTRAMUSCULAR | Status: DC

## 2023-08-17 MED ORDER — ACETAMINOPHEN-CAFFEINE 500-65 MG PO TABS
2.0000 | ORAL_TABLET | Freq: Once | ORAL | Status: AC
  Administered 2023-08-17: 2 via ORAL
  Filled 2023-08-17: qty 2

## 2023-08-17 MED ORDER — ONDANSETRON 4 MG PO TBDP
4.0000 mg | ORAL_TABLET | Freq: Once | ORAL | Status: AC
  Administered 2023-08-17: 4 mg via ORAL
  Filled 2023-08-17: qty 1

## 2023-08-17 MED ORDER — LACTATED RINGERS IV SOLN: Freq: Once | INTRAVENOUS | Status: AC

## 2023-08-17 MED ORDER — DEXAMETHASONE SODIUM PHOSPHATE 10 MG/ML IJ SOLN
10.0000 mg | Freq: Once | INTRAMUSCULAR | Status: AC
  Administered 2023-08-18: 10 mg via INTRAVENOUS
  Filled 2023-08-17: qty 1

## 2023-08-17 MED ORDER — METOCLOPRAMIDE HCL 5 MG/ML IJ SOLN
10.0000 mg | Freq: Once | INTRAMUSCULAR | Status: AC
  Administered 2023-08-18: 10 mg via INTRAVENOUS
  Filled 2023-08-17: qty 2

## 2023-08-17 NOTE — MAU Provider Note (Signed)
Chief Complaint: Headache and Emesis   Event Date/Time   First Provider Initiated Contact with Patient 08/17/23 2327        SUBJECTIVE HPI: Claudia Miller is a 32 y.o. [id number] at [redacted]w[redacted]d by LMP who presents to maternity admissions reporting headache for 3 days unrelieved by Tylenol and nausea and vomiting.  Has had N/V throughout the whole pregnancy.  Also has some intermittent abdominal pain.Marland KitchenHas not had any care yet for this pregnancy.  She denies vaginal bleeding, vaginal itching/burning, urinary symptoms,  dizziness, or fever/chills.    Headache  The current episode started in the past 7 days. The problem occurs constantly. The problem has been unchanged. The quality of the pain is described as aching and dull. Associated symptoms include vomiting. Pertinent negatives include no abdominal pain or back pain. Nothing aggravates the symptoms. She has tried acetaminophen for the symptoms. The treatment provided no relief.  Emesis  This is a recurrent problem. There has been no fever. Associated symptoms include headaches. Pertinent negatives include no abdominal pain or chills.   RN Note: Sent from ED for c/o headache and N/V. Pt reports headache x 3 days. Unrelieved by tylenol.  Reports having n/v everyday since the beginning of her pregnancy. Stated she can't keep anything down.  Reports some abd pain when she sits down too long like a pulling sensation.   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   + THC in MAU 04/15/2020   Supervision of normal pregnancy, antepartum 07/12/2018    Nursing Staff Provider Office Location  [facility] Dating   [place] 6.4 on 06/04/18 Language  English  Anatomy US  Normal Flu Vaccine   08/15/18 Genetic Screen  NIPS: normal  TDaP vaccine   11/30/18 Hgb A1C or  GTT Early  Third trimester nl 2 hour  Rhogam   n/a   LAB RESULTS  Feeding Plan Bottle Blood Type A/Positive/-- (07/17 1211)  Contraception Depo Antibody Negative (07/17 1211)  Circumcision  girl  Rubell   Trichomoniasis 08/09/2018   [x]  TOC   Past Surgical History:  Procedure Laterality Date   CESAREAN SECTION N/A 01/23/2019   Procedure: CESAREAN SECTION;  Surgeon: Tereso Newcomer, MD;  Location: [facility];  Service: Obstetrics;  Laterality: N/A;   Social History   Socioeconomic History   Marital status: Single    Spouse name: Not on file   Number of children: 3   Years of education: Not on file   Highest education level: Not on file  Occupational History   Occupation: unemployed  Tobacco Use   Smoking status: Former    Current packs/day: 0.00    Types: Cigarettes    Quit date: 05/31/2018    Years since quitting: 5.2   Smokeless tobacco: Never  Vaping Use   Vaping status: Never Used  Substance and Sexual Activity   Alcohol use: No   Drug use: No   Sexual activity: Yes    Birth control/protection: None  Other Topics Concern   Not on file  Social History Narrative   Not on file   Social Determinants of Health   Financial Resource Strain: Not on file  Food Insecurity: Not on file  Transportation Needs: Not on file  Physical Activity: Not on file  Stress: Not on file  Social Connections: Not on file  Intimate Partner Violence: Not At Risk (08/05/2023)   Received from [facility]   Abuse Screen  Feels Unsafe at Home or Work/School: no    Feels Threatened by Someone: no    Does Anyone Try to Keep You From Having Contact with Others or Doing Things Outside Your Home?: no    Physical Signs of Abuse Present: no   No current facility-administered medications on file prior to encounter.   Current Outpatient Medications on File Prior to Encounter  Medication Sig Dispense Refill   cyclobenzaprine (FLEXERIL) 10 MG tablet Take 1 tablet (10 mg total) by mouth 2 (two) times daily as needed for muscle spasms. 20 tablet 0   lidocaine (LIDODERM) 5 % Place 1 patch onto the skin daily. Remove & Discard patch within 12  hours or as directed by MD 30 patch 0   methocarbamol (ROBAXIN) 500 MG tablet Take 1 tablet (500 mg total) by mouth 2 (two) times daily. 20 tablet 0   naproxen (NAPROSYN) 500 MG tablet Take 1 tablet (500 mg total) by mouth 2 (two) times daily. 30 tablet 0   oxyCODONE-acetaminophen (PERCOCET/ROXICET) 5-325 MG tablet Take 1 tablet by mouth every 6 (six) hours as needed for severe pain. 15 tablet 0   Allergies  Allergen Reactions   Fish Allergy Anaphylaxis    I have reviewed patient's Past Medical Hx, Surgical Hx, Family Hx, Social Hx, medications and allergies.   ROS:  Review of Systems  Constitutional:  Negative for chills.  Gastrointestinal:  Positive for vomiting. Negative for abdominal pain.  Musculoskeletal:  Negative for back pain.  Neurological:  Positive for headaches.   Review of Systems  Other systems negative   Physical Exam  Physical Exam Patient Vitals for the past 24 hrs:  BP Temp Pulse Resp SpO2 Height Weight  08/17/23 1832 (!) 106/44 98.5 F (36.9 C) 77 18 -- -- --  08/17/23 1711 -- -- -- -- -- 5\' 1"  (1.549 m) 88.5 kg  08/17/23 1710 (!) 120/50 98.5 F (36.9 C) 79 16 100 % -- --   Constitutional: Well-developed, well-nourished female in no acute distress.  Cardiovascular: normal rate Respiratory: normal effort GI: Abd soft, non-tender.  MS: Extremities nontender, no edema, normal ROM Neurologic: Alert and oriented x 4.  GU: Neg CVAT.   LAB RESULTS Results for orders placed or performed during the hospital encounter of 08/17/23 (from the past 24 hour(s))  POC Urine Pregnancy, ED (not at [facility] or [facility])     Status: Abnormal   Collection Time: 08/17/23  5:21 PM  Result Value Ref Range   Preg Test, Ur Positive (A) Negative  Urinalysis, Routine w reflex microscopic -Urine, Clean Catch     Status: Abnormal   Collection Time: 08/17/23  5:44 PM  Result Value Ref Range   Color, Urine YELLOW YELLOW   APPearance CLEAR CLEAR   Specific Gravity, Urine 1.023 1.005 -  1.030   pH 6.0 5.0 - 8.0   Glucose, UA NEGATIVE NEGATIVE mg/dL   Hgb urine dipstick SMALL (A) NEGATIVE   Bilirubin Urine NEGATIVE NEGATIVE   Ketones, ur NEGATIVE NEGATIVE mg/dL   Protein, ur NEGATIVE NEGATIVE mg/dL   Nitrite NEGATIVE NEGATIVE   Leukocytes,Ua MODERATE (A) NEGATIVE   RBC / HPF 6-10 0 - 5 RBC/hpf   WBC, UA >50 0 - 5 WBC/hpf   Bacteria, UA RARE (A) NONE SEEN   Squamous Epithelial / HPF 0-5 0 - 5 /HPF   Mucus PRESENT   CBC     Status: None   Collection Time: 08/17/23 10:12 PM  Result Value Ref Range   WBC  7.0 4.0 - 10.5 K/uL   RBC 4.40 3.87 - 5.11 MIL/uL   Hemoglobin 13.4 12.0 - 15.0 g/dL   HCT 56.2 13.0 - 86.5 %   MCV 91.8 80.0 - 100.0 fL   MCH 30.5 26.0 - 34.0 pg   MCHC 33.2 30.0 - 36.0 g/dL   RDW 78.4 69.6 - 29.5 %   Platelets 242 150 - 400 K/uL   nRBC 0.0 0.0 - 0.2 %  Comprehensive metabolic panel     Status: Abnormal   Collection Time: 08/17/23 10:12 PM  Result Value Ref Range   Sodium 133 (L) 135 - 145 mmol/L   Potassium 4.0 3.5 - 5.1 mmol/L   Chloride 104 98 - 111 mmol/L   CO2 20 (L) 22 - 32 mmol/L   Glucose, Bld 79 70 - 99 mg/dL   BUN 9 6 - 20 mg/dL   Creatinine, Ser 2.84 0.44 - 1.00 mg/dL   Calcium 8.9 8.9 - 13.2 mg/dL   Total Protein 7.8 6.5 - 8.1 g/dL   Albumin 3.7 3.5 - 5.0 g/dL   AST 16 15 - 41 U/L   ALT 15 0 - 44 U/L   Alkaline Phosphatase 44 38 - 126 U/L   Total Bilirubin 0.4 0.3 - 1.2 mg/dL   GFR, Estimated >44 >01 mL/min   Anion gap 9 5 - 15  hCG, quantitative, pregnancy     Status: Abnormal   Collection Time: 08/17/23 10:12 PM  Result Value Ref Range   hCG, Beta Chain, Quant, S >250,000 (H) <5 mIU/mL       IMAGING US OB Comp Less 14 Wks  Result Date: 08/18/2023 CLINICAL DATA:  Abdominal pain when sitting. EXAM: OBSTETRIC <14 WK [place] AND TRANSVAGINAL OB US TECHNIQUE: Both transabdominal and transvaginal ultrasound examinations were performed for complete evaluation of the gestation as well as the maternal uterus, adnexal regions, and  pelvic cul-de-sac. Transvaginal technique was performed to assess early pregnancy. COMPARISON:  None Available. FINDINGS: Intrauterine gestational sac: Single Yolk sac:  Visualized. Embryo:  Visualized. Cardiac Activity: Visualized. Heart Rate: 162 bpm CRL:  23.0 mm   9 w   0 d                  [place] EDC: March 22, 2024 Subchorionic hemorrhage:  None visualized. Maternal uterus/adnexae: The right ovary measures 2.2 cm x 2.9 cm x 2.4 cm and is normal in appearance. The left ovary measures 2.5 cm x 2.1 cm x 2.5 cm and is normal in appearance. No pelvic free fluid is seen. IMPRESSION: Single, viable intrauterine pregnancy at approximately 9 weeks and 0 days gestation by ultrasound evaluation. Electronically Signed   By: Aram Candela M.D.   On: 08/18/2023 01:23     MAU Management/MDM: I have reviewed the triage vital signs and the nursing notes.   Pertinent labs & imaging results that were available during my care of the patient were reviewed by me and considered in my medical decision making (see chart for details).      I have reviewed her medical records including past results, notes and treatments. Medical, Surgical, and family history were reviewed.  Medications and recent lab tests were reviewed  Ordered usual first trimester r/o ectopic labs.   Pelvic cultures done Will check baseline Ultrasound to rule out ectopic. .   This bleeding/pain can represent a normal pregnancy with bleeding, spontaneous abortion or even an ectopic which can be life-threatening.  The process as listed above helps to determine  which of these is present.  Treatments in MAU included Excedrin Tension for headache.  We gave IV fluids, Reglan and Benadryl for headache also.  Zofran given for nausea.  Able to tolerate PO intake.    ASSESSMENT Single IUP at [redacted]w[redacted]d Nausea and vomiting Headache Abdominal pain Pregnancy of unknown location  PLAN Discharge home Rx Phenergan and Zofran for nausea Rx Prenatal vitamin  Pt  stable at time of discharge. Encouraged to return here if she develops worsening of symptoms, increase in pain, fever, or other concerning symptoms.    [name] CNM, MSN Certified Nurse-Midwife 08/17/2023  11:27 PM

## 2023-08-17 NOTE — MAU Provider Note (Signed)
Chief Complaint: Headache and Emesis   Event Date/Time   First Provider Initiated Contact with Patient 08/17/23 2234        SUBJECTIVE HPI: Claudia Miller is a 33 y.o. [id number] at [redacted]w[redacted]d by LMP who presents to maternity admissions reporting headache for 3 days, unrelieved by Tylenol  Has had nausea and vomiting the entire pregnancy.  .Also has some lower abdomen pain. She denies vaginal bleeding, urinary symptoms, dizziness, or fever/chills.    Headache  This is a recurrent problem. The current episode started in the past 7 days. The problem occurs constantly. The problem has been unchanged. The quality of the pain is described as aching and dull. Associated symptoms include nausea and vomiting. Pertinent negatives include no abdominal pain, back pain or fever. She has tried acetaminophen for the symptoms. The treatment provided no relief.  Emesis  This is a recurrent problem. The current episode started more than 1 month ago. The problem has been unchanged. There has been no fever. Associated symptoms include headaches. Pertinent negatives include no abdominal pain or fever. She has tried nothing for the symptoms.   RN Note: Sent from ED for c/o headache and N/V. Pt reports headache x 3 days. Unrelieved by tylenol.  Reports having n/v everyday since the beginning of her pregnancy. Stated she can't keep anything down.  Reports some abd pain when she sits down too long like a pulling sensation.   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   + THC in MAU 04/15/2020   Supervision of normal pregnancy, antepartum 07/12/2018    Nursing Staff Provider Office Location  [facility] Dating   [place] 6.4 on 06/04/18 Language  English  Anatomy US  Normal Flu Vaccine   08/15/18 Genetic Screen  NIPS: normal  TDaP vaccine   11/30/18 Hgb A1C or  GTT Early  Third trimester nl 2 hour  Rhogam   n/a   LAB RESULTS  Feeding Plan Bottle Blood Type A/Positive/-- (07/17 1211)   Contraception Depo Antibody Negative (07/17 1211) Circumcision  girl  Rubell   Trichomoniasis 08/09/2018   [x]  TOC   Past Surgical History:  Procedure Laterality Date   CESAREAN SECTION N/A 01/23/2019   Procedure: CESAREAN SECTION;  Surgeon: Tereso Newcomer, MD;  Location: [facility];  Service: Obstetrics;  Laterality: N/A;   Social History   Socioeconomic History   Marital status: Single    Spouse name: Not on file   Number of children: 3   Years of education: Not on file   Highest education level: Not on file  Occupational History   Occupation: unemployed  Tobacco Use   Smoking status: Former    Current packs/day: 0.00    Types: Cigarettes    Quit date: 05/31/2018    Years since quitting: 5.2   Smokeless tobacco: Never  Vaping Use   Vaping status: Never Used  Substance and Sexual Activity   Alcohol use: No   Drug use: No   Sexual activity: Yes    Birth control/protection: None  Other Topics Concern   Not on file  Social History Narrative   Not on file   Social Determinants of Health   Financial Resource Strain: Not on file  Food Insecurity: Not on file  Transportation Needs: Not on file  Physical Activity: Not on file  Stress: Not on file  Social Connections: Not on file  Intimate Partner Violence: Not At Risk (08/05/2023)   Received from  [facility]   Abuse Screen    Feels Unsafe at Home or Work/School: no    Feels Threatened by Someone: no    Does Anyone Try to Keep You From Having Contact with Others or Doing Things Outside Your Home?: no    Physical Signs of Abuse Present: no   No current facility-administered medications on file prior to encounter.   Current Outpatient Medications on File Prior to Encounter  Medication Sig Dispense Refill   cyclobenzaprine (FLEXERIL) 10 MG tablet Take 1 tablet (10 mg total) by mouth 2 (two) times daily as needed for muscle spasms. 20 tablet 0   lidocaine (LIDODERM) 5 % Place 1 patch onto  the skin daily. Remove & Discard patch within 12 hours or as directed by MD 30 patch 0   methocarbamol (ROBAXIN) 500 MG tablet Take 1 tablet (500 mg total) by mouth 2 (two) times daily. 20 tablet 0   naproxen (NAPROSYN) 500 MG tablet Take 1 tablet (500 mg total) by mouth 2 (two) times daily. 30 tablet 0   oxyCODONE-acetaminophen (PERCOCET/ROXICET) 5-325 MG tablet Take 1 tablet by mouth every 6 (six) hours as needed for severe pain. 15 tablet 0   Allergies  Allergen Reactions   Fish Allergy Anaphylaxis    I have reviewed patient's Past Medical Hx, Surgical Hx, Family Hx, Social Hx, medications and allergies.   ROS:  Review of Systems  Constitutional:  Negative for fever.  Gastrointestinal:  Positive for nausea and vomiting. Negative for abdominal pain.  Musculoskeletal:  Negative for back pain.  Neurological:  Positive for headaches.   Review of Systems  Other systems negative   Physical Exam  Physical Exam Patient Vitals for the past 24 hrs:  BP Temp Pulse Resp SpO2 Height Weight  08/17/23 1832 (!) 106/44 98.5 F (36.9 C) 77 18 -- -- --  08/17/23 1711 -- -- -- -- -- 5\' 1"  (1.549 m) 88.5 kg  08/17/23 1710 (!) 120/50 98.5 F (36.9 C) 79 16 100 % -- --   Constitutional: Well-developed, well-nourished female in no acute distress.  Cardiovascular: normal rate Respiratory: normal effort GI: Abd soft, non-tender.  MS: Extremities nontender, no edema, normal ROM Neurologic: Alert and oriented x 4.  GU: Neg CVAT.    LAB RESULTS Results for orders placed or performed during the hospital encounter of 08/17/23 (from the past 24 hour(s))  POC Urine Pregnancy, ED (not at [facility] or [facility])     Status: Abnormal   Collection Time: 08/17/23  5:21 PM  Result Value Ref Range   Preg Test, Ur Positive (A) Negative  Urinalysis, Routine w reflex microscopic -Urine, Clean Catch     Status: Abnormal   Collection Time: 08/17/23  5:44 PM  Result Value Ref Range   Color, Urine YELLOW YELLOW    APPearance CLEAR CLEAR   Specific Gravity, Urine 1.023 1.005 - 1.030   pH 6.0 5.0 - 8.0   Glucose, UA NEGATIVE NEGATIVE mg/dL   Hgb urine dipstick SMALL (A) NEGATIVE   Bilirubin Urine NEGATIVE NEGATIVE   Ketones, ur NEGATIVE NEGATIVE mg/dL   Protein, ur NEGATIVE NEGATIVE mg/dL   Nitrite NEGATIVE NEGATIVE   Leukocytes,Ua MODERATE (A) NEGATIVE   RBC / HPF 6-10 0 - 5 RBC/hpf   WBC, UA >50 0 - 5 WBC/hpf   Bacteria, UA RARE (A) NONE SEEN   Squamous Epithelial / HPF 0-5 0 - 5 /HPF   Mucus PRESENT   CBC     Status: None  Collection Time: 08/17/23 10:12 PM  Result Value Ref Range   WBC 7.0 4.0 - 10.5 K/uL   RBC 4.40 3.87 - 5.11 MIL/uL   Hemoglobin 13.4 12.0 - 15.0 g/dL   HCT 16.1 09.6 - 04.5 %   MCV 91.8 80.0 - 100.0 fL   MCH 30.5 26.0 - 34.0 pg   MCHC 33.2 30.0 - 36.0 g/dL   RDW 40.9 81.1 - 91.4 %   Platelets 242 150 - 400 K/uL   nRBC 0.0 0.0 - 0.2 %  Comprehensive metabolic panel     Status: Abnormal   Collection Time: 08/17/23 10:12 PM  Result Value Ref Range   Sodium 133 (L) 135 - 145 mmol/L   Potassium 4.0 3.5 - 5.1 mmol/L   Chloride 104 98 - 111 mmol/L   CO2 20 (L) 22 - 32 mmol/L   Glucose, Bld 79 70 - 99 mg/dL   BUN 9 6 - 20 mg/dL   Creatinine, Ser 7.82 0.44 - 1.00 mg/dL   Calcium 8.9 8.9 - 95.6 mg/dL   Total Protein 7.8 6.5 - 8.1 g/dL   Albumin 3.7 3.5 - 5.0 g/dL   AST 16 15 - 41 U/L   ALT 15 0 - 44 U/L   Alkaline Phosphatase 44 38 - 126 U/L   Total Bilirubin 0.4 0.3 - 1.2 mg/dL   GFR, Estimated >21 >30 mL/min   Anion gap 9 5 - 15  hCG, quantitative, pregnancy     Status: Abnormal   Collection Time: 08/17/23 10:12 PM  Result Value Ref Range   hCG, Beta Chain, Quant, S >250,000 (H) <5 mIU/mL      IMAGING US OB Comp Less 14 Wks  Result Date: 08/18/2023 CLINICAL DATA:  Abdominal pain when sitting. EXAM: OBSTETRIC <14 WK [place] AND TRANSVAGINAL OB US TECHNIQUE: Both transabdominal and transvaginal ultrasound examinations were performed for complete evaluation of the  gestation as well as the maternal uterus, adnexal regions, and pelvic cul-de-sac. Transvaginal technique was performed to assess early pregnancy. COMPARISON:  None Available. FINDINGS: Intrauterine gestational sac: Single Yolk sac:  Visualized. Embryo:  Visualized. Cardiac Activity: Visualized. Heart Rate: 162 bpm CRL:  23.0 mm   9 w   0 d                  [place] EDC: March 22, 2024 Subchorionic hemorrhage:  None visualized. Maternal uterus/adnexae: The right ovary measures 2.2 cm x 2.9 cm x 2.4 cm and is normal in appearance. The left ovary measures 2.5 cm x 2.1 cm x 2.5 cm and is normal in appearance. No pelvic free fluid is seen. IMPRESSION: Single, viable intrauterine pregnancy at approximately 9 weeks and 0 days gestation by ultrasound evaluation. Electronically Signed   By: Aram Candela M.D.   On: 08/18/2023 01:23     MAU Management/MDM: I have reviewed the triage vital signs and the nursing notes.   Pertinent labs & imaging results that were available during my care of the patient were reviewed by me and considered in my medical decision making (see chart for details).      I have reviewed her medical records including past results, notes and treatments. Medical, Surgical, and family history were reviewed.  Medications and recent lab tests were reviewed  Ordered usual first trimester r/o ectopic labs.   Will check baseline Ultrasound to rule out ectopic.  This bleeding/pain can represent a normal pregnancy with bleeding, spontaneous abortion or even an ectopic which can be life-threatening.  The  process as listed above helps to determine which of these is present.  Reviewed results Live single IUP noted on [place] Patient felt better after IV fluids, Reglan, Benadryl, Decadron, and Excedrin Tension.   ASSESSMENT Single IUP at [redacted]w[redacted]d Pregnancy of unknown location Nausea and vomiting Dehydration Headache, persistent  PLAN Discharge home Rx Zofran and Phenergan for nausea Rx Prenatal vitamin   Advance diet as tolerated Pt stable at time of discharge. Encouraged to return here if she develops worsening of symptoms, increase in pain, fever, or other concerning symptoms.    [name] CNM, MSN Certified Nurse-Midwife 08/17/2023  10:34 PM

## 2023-08-17 NOTE — ED Triage Notes (Signed)
Pt endorses headache for 3 days, dry mouth, nv/d and weakness. Also found out she is pregnant a few weeks ago. LMP lat June.

## 2023-08-17 NOTE — ED Provider Triage Note (Signed)
Emergency Medicine Provider Triage Evaluation Note  Claudia Miller , a 32 y.o. female  was evaluated in triage.  Pt complains of concerns for headache, nausea, vomiting.  Notes that she found out she was pregnant several weeks ago.  Has not had any OB care at this time.  Her last menstrual cycle was late June.  Denies abdominal pain, vaginal bleeding, increase in vaginal discharge.  Review of Systems  Positive:  Negative:   Physical Exam  BP (!) 120/50 (BP Location: Right Arm)   Pulse 79   Temp 98.5 F (36.9 C)   Resp 16   Ht 5\' 1"  (1.549 m)   Wt 88.5 kg   LMP 07/17/2023   SpO2 100%   BMI 36.84 kg/m  Gen:   Awake, no distress   Resp:  Normal effort  MSK:   Moves extremities without difficulty  Other:  No abdominal TTP.  Medical Decision Making  Medically screening exam initiated at 5:31 PM.  Appropriate orders placed.  ELLYSON GENT was informed that the remainder of the evaluation will be completed by another provider, this initial triage assessment does not replace that evaluation, and the importance of remaining in the ED until their evaluation is complete.  5:31 PM -spoke with MAU APP Lance Bosch who accepts the patient in transfer   [facility], Tishara Pizano A, PA-C 08/17/23 1735

## 2023-08-17 NOTE — MAU Note (Signed)
Sent from ED for c/o headache and N/V. Pt reports headache x 3 days. Unrelieved by tylenol.  Reports having n/v everyday since the beginning of her pregnancy. Stated she can't keep anything down.  Reports some abd pain when she sits down too long like a pulling sensation.

## 2023-08-18 ENCOUNTER — Inpatient Hospital Stay (HOSPITAL_COMMUNITY): Payer: BLUE CROSS/BLUE SHIELD

## 2023-08-18 DIAGNOSIS — R109 Unspecified abdominal pain: Secondary | ICD-10-CM

## 2023-08-18 DIAGNOSIS — O219 Vomiting of pregnancy, unspecified: Secondary | ICD-10-CM

## 2023-08-18 DIAGNOSIS — R519 Headache, unspecified: Secondary | ICD-10-CM

## 2023-08-18 DIAGNOSIS — O3680X0 Pregnancy with inconclusive fetal viability, not applicable or unspecified: Secondary | ICD-10-CM

## 2023-08-18 DIAGNOSIS — O26891 Other specified pregnancy related conditions, first trimester: Secondary | ICD-10-CM

## 2023-08-18 DIAGNOSIS — Z3A09 9 weeks gestation of pregnancy: Secondary | ICD-10-CM

## 2023-08-18 MED ORDER — PROMETHAZINE HCL 25 MG PO TABS: 25.0000 mg | ORAL_TABLET | Freq: Four times a day (QID) | ORAL | 2 refills | Status: DC | PRN

## 2023-08-18 MED ORDER — PRENATAL VITAMIN 27-0.8 MG PO TABS: 1.0000 | ORAL_TABLET | Freq: Every day | ORAL | 12 refills | Status: AC

## 2023-08-18 MED ORDER — ONDANSETRON 4 MG PO TBDP: 4.0000 mg | ORAL_TABLET | Freq: Four times a day (QID) | ORAL | 0 refills | Status: DC | PRN

## 2023-08-18 MED ORDER — LACTATED RINGERS IV BOLUS
1000.0000 mL | Freq: Once | INTRAVENOUS | Status: AC
  Administered 2023-08-18: 1000 mL via INTRAVENOUS

## 2023-08-18 NOTE — MAU Note (Signed)
IV team at bedside 

## 2023-09-04 ENCOUNTER — Other Ambulatory Visit: Payer: Self-pay

## 2023-09-04 ENCOUNTER — Inpatient Hospital Stay (HOSPITAL_COMMUNITY)
Admission: AD | Admit: 2023-09-04 | Discharge: 2023-09-05 | Disposition: A | Discharge status: Home / Self Care | Payer: BLUE CROSS/BLUE SHIELD | Attending: Obstetrics & Gynecology | Admitting: Obstetrics & Gynecology

## 2023-09-04 ENCOUNTER — Encounter (HOSPITAL_COMMUNITY): Payer: Self-pay | Admitting: Obstetrics & Gynecology

## 2023-09-04 DIAGNOSIS — O99321 Drug use complicating pregnancy, first trimester: Secondary | ICD-10-CM | POA: Insufficient documentation

## 2023-09-04 DIAGNOSIS — O26891 Other specified pregnancy related conditions, first trimester: Secondary | ICD-10-CM | POA: Diagnosis present

## 2023-09-04 DIAGNOSIS — E860 Dehydration: Secondary | ICD-10-CM | POA: Diagnosis present

## 2023-09-04 DIAGNOSIS — R519 Headache, unspecified: Secondary | ICD-10-CM

## 2023-09-04 DIAGNOSIS — Z3A11 11 weeks gestation of pregnancy: Secondary | ICD-10-CM | POA: Insufficient documentation

## 2023-09-04 MED ORDER — ACETAMINOPHEN-CAFFEINE 500-65 MG PO TABS: 1.0000 | ORAL_TABLET | Freq: Once | ORAL | Status: DC

## 2023-09-04 NOTE — MAU Note (Signed)
.  Claudia Miller is a 32 y.o. at [redacted]w[redacted]d here in MAU reporting: HA that started 3 days ago with hot flashes and chills that started today. Has had nausea but denies emesis. Concerned she is dehydrated. Reports sharp lower abdominal pain earlier but states it went away. Denies VB or LOF.   Onset of complaint: 3 days  Pain score: 7 Vitals:   09/04/23 2309  BP: (!) 119/49  Pulse: 76  Resp: 17  Temp: 98.4 F (36.9 C)  SpO2: 99%     FHT:172 Lab orders placed from triage:  UA

## 2023-09-05 LAB — URINALYSIS, ROUTINE W REFLEX MICROSCOPIC
Bilirubin Urine: NEGATIVE
Glucose, UA: NEGATIVE mg/dL
Hgb urine dipstick: NEGATIVE
Ketones, ur: NEGATIVE mg/dL
Nitrite: NEGATIVE
Protein, ur: NEGATIVE mg/dL
Specific Gravity, Urine: 1.027 (ref 1.005–1.030)
pH: 5 (ref 5.0–8.0)

## 2023-09-05 MED ORDER — ACETAMINOPHEN-CAFFEINE 500-65 MG PO TABS
2.0000 | ORAL_TABLET | Freq: Once | ORAL | Status: AC
  Administered 2023-09-05: 2 via ORAL
  Filled 2023-09-05: qty 2

## 2023-09-05 NOTE — MAU Provider Note (Signed)
History     CSN: [id number]  Arrival date and time: 09/04/23 2234   Event Date/Time   First Provider Initiated Contact with Patient 09/05/23 0000      Chief Complaint  Patient presents with   Headache   Claudia Miller , a  32 y.o. [id number] at [redacted]w[redacted]d presents to MAU with complaints of on-going headache for the last 3 days. She states that the pain is worsened by light and sound. Reports a hx of migraines which she was taking Excedrin with relief prior to pregnancy. She states that today she attempted to 1 tablet of PO tylenol this evening at 10pm without relief. She states that she believes her head hurts because she is dehydrated. Diet recall today has been mashed potatoes and a bag of chips. She also reports drinking "maybe" 3 cups of water today. She endorses nausea but denies vomiting. She also reports that she has not been resting well. She states that the last time she slept well was 3 days ago. She denies vaginal bleeding leaking of fluid or abnormal vaginal discharge.          OB History     Gravida  5   Para  4   Term  3   Preterm  1   AB  0   Living  4      SAB  0   IAB  0   Ectopic  0   Multiple  0   Live Births  4           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   + THC in MAU 04/15/2020   Supervision of normal pregnancy, antepartum 07/12/2018    Nursing Staff Provider Office Location  [facility] Dating   [place] 6.4 on 06/04/18 Language  English  Anatomy US  Normal Flu Vaccine   08/15/18 Genetic Screen  NIPS: normal  TDaP vaccine   11/30/18 Hgb A1C or  GTT Early  Third trimester nl 2 hour  Rhogam   n/a   LAB RESULTS  Feeding Plan Bottle Blood Type A/Positive/-- (07/17 1211)  Contraception Depo Antibody Negative (07/17 1211) Circumcision  girl  Rubell   Trichomoniasis 08/09/2018   [x]  TOC    Past Surgical History:  Procedure Laterality Date   CESAREAN SECTION N/A 01/23/2019   Procedure: CESAREAN SECTION;  Surgeon:  Tereso Newcomer, MD;  [facility]: [facility];  Service: Obstetrics;  Laterality: N/A;    Family History  Problem Relation Age of Onset   Asthma Mother    Diabetes Mother    Hypertension Mother    Cancer Father    Hypertension Father    Asthma Sister    Hypertension Sister    Asthma Brother    Hypertension Brother     Social History   Tobacco Use   Smoking status: Former    Current packs/day: 0.00    Types: Cigarettes    Quit date: 05/31/2018    Years since quitting: 5.2   Smokeless tobacco: Never  Vaping Use   Vaping status: Never Used  Substance Use Topics   Alcohol use: No   Drug use: Yes    Types: Marijuana    Comment: 09/04/2023    Allergies:  Allergies  Allergen Reactions   Fish Allergy Anaphylaxis    Medications Prior to Admission  Medication Sig Dispense Refill Last Dose   Prenatal Vit-Fe Fumarate-FA (PRENATAL VITAMIN) 27-0.8 MG TABS Take 1  tablet by mouth daily. 30 tablet 12 09/04/2023   cyclobenzaprine (FLEXERIL) 10 MG tablet Take 1 tablet (10 mg total) by mouth 2 (two) times daily as needed for muscle spasms. 20 tablet 0    lidocaine (LIDODERM) 5 % Place 1 patch onto the skin daily. Remove & Discard patch within 12 hours or as directed by MD 30 patch 0    ondansetron (ZOFRAN-ODT) 4 MG disintegrating tablet Take 1 tablet (4 mg total) by mouth every 6 (six) hours as needed for nausea. 20 tablet 0 More than a month   promethazine (PHENERGAN) 25 MG tablet Take 1 tablet (25 mg total) by mouth every 6 (six) hours as needed for nausea or vomiting. 30 tablet 2     Review of Systems  Constitutional:  Negative for chills, fatigue and fever.  Eyes:  Negative for pain and visual disturbance.  Respiratory:  Negative for apnea, shortness of breath and wheezing.   Cardiovascular:  Negative for chest pain and palpitations.  Gastrointestinal:  Negative for abdominal pain, constipation, diarrhea, nausea and vomiting.  Genitourinary:  Negative for difficulty urinating,  dysuria, pelvic pain, vaginal bleeding, vaginal discharge and vaginal pain.  Musculoskeletal:  Negative for back pain.  Neurological:  Positive for headaches. Negative for seizures and weakness.  Psychiatric/Behavioral:  Negative for suicidal ideas.    Physical Exam   Blood pressure (!) 120/59, pulse 72, temperature 98.4 F (36.9 C), temperature source Oral, resp. rate 17, height 5\' 1"  (1.549 m), weight 80.5 kg, last menstrual period 06/26/2023, SpO2 99%.  Physical Exam Vitals and nursing note reviewed.  Constitutional:      General: She is not in acute distress.    Appearance: Normal appearance.  HENT:     Head: Normocephalic.  Pulmonary:     Effort: Pulmonary effort is normal.  Musculoskeletal:     Cervical back: Normal range of motion.  Skin:    General: Skin is warm and dry.  Neurological:     Mental Status: She is alert and oriented to person, place, and time.     GCS: GCS eye subscore is 4. GCS verbal subscore is 5. GCS motor subscore is 6.     Deep Tendon Reflexes: Reflexes normal.  Psychiatric:        Mood and Affect: Mood normal.    FHT obtained in triage   MAU Course  Procedures Orders Placed This Encounter  Procedures   Urinalysis, Routine w reflex microscopic -Urine, Clean Catch   Meds ordered this encounter  Medications   DISCONTD: acetaminophen-caffeine (EXCEDRIN TENSION HEADACHE) 500-65 MG per tablet 1 tablet   acetaminophen-caffeine (EXCEDRIN TENSION HEADACHE) 500-65 MG per tablet 2 tablet    MDM - Pitcher of water given alongside a sandwich tray  - OP excedrin ordered.  - Pain improved with food, rest and Excedrin.  - plan for discharge.   Assessment and Plan   1. Pregnancy headache in first trimester   2. [redacted] weeks gestation of pregnancy   3. Mild dehydration    - Reviewed that headaches can be a normal discomfort of pregnancy  - Discussion OTC options for headaches including Excedrin tension and Tylenol Extra strength.  - Reviewed worsening  signs and return precautions.  - Patient discharged home in stable condition and may return to MAU as needed   Claudette Head, MSN CNM  09/05/2023, 12:00 AM

## 2023-11-21 DIAGNOSIS — Z3482 Encounter for supervision of other normal pregnancy, second trimester: Secondary | ICD-10-CM | POA: Diagnosis not present

## 2023-11-21 DIAGNOSIS — O99810 Abnormal glucose complicating pregnancy: Secondary | ICD-10-CM | POA: Diagnosis not present

## 2023-11-21 DIAGNOSIS — Z23 Encounter for immunization: Secondary | ICD-10-CM | POA: Diagnosis not present

## 2023-11-21 LAB — OB RESULTS CONSOLE VARICELLA ZOSTER ANTIBODY, IGG: Varicella: IMMUNE

## 2023-11-21 LAB — OB RESULTS CONSOLE GC/CHLAMYDIA
Chlamydia: NEGATIVE
Neisseria Gonorrhea: NEGATIVE

## 2023-11-21 LAB — OB RESULTS CONSOLE RPR: RPR: NONREACTIVE

## 2023-11-21 LAB — OB RESULTS CONSOLE HIV ANTIBODY (ROUTINE TESTING): HIV: NONREACTIVE

## 2023-11-21 LAB — OB RESULTS CONSOLE RUBELLA ANTIBODY, IGM: Rubella: IMMUNE

## 2023-11-21 LAB — HEPATITIS C ANTIBODY: HCV Ab: NEGATIVE

## 2023-11-21 LAB — GLUCOSE, 1 HOUR GESTATIONAL

## 2023-12-02 LAB — GLUCOSE, 3 HOUR GESTATIONAL
Glucose 3 Hour: 101
Glucose, 1 hour: 168
Glucose, 2 hour: 121
Glucose, Fasting: 92

## 2023-12-04 LAB — HORIZON CUSTOM

## 2023-12-06 ENCOUNTER — Encounter: Payer: Self-pay | Admitting: Family Medicine

## 2023-12-06 ENCOUNTER — Ambulatory Visit: Payer: BLUE CROSS/BLUE SHIELD | Admitting: Family Medicine

## 2023-12-06 ENCOUNTER — Encounter: Payer: Self-pay | Admitting: *Deleted

## 2023-12-06 ENCOUNTER — Other Ambulatory Visit: Payer: Self-pay

## 2023-12-06 VITALS — BP 112/50 | HR 78 | Wt 204.7 lb

## 2023-12-06 DIAGNOSIS — O09892 Supervision of other high risk pregnancies, second trimester: Secondary | ICD-10-CM

## 2023-12-06 DIAGNOSIS — O0990 Supervision of high risk pregnancy, unspecified, unspecified trimester: Secondary | ICD-10-CM

## 2023-12-06 DIAGNOSIS — O0992 Supervision of high risk pregnancy, unspecified, second trimester: Secondary | ICD-10-CM | POA: Diagnosis not present

## 2023-12-06 DIAGNOSIS — F1290 Cannabis use, unspecified, uncomplicated: Secondary | ICD-10-CM

## 2023-12-06 DIAGNOSIS — Z3A24 24 weeks gestation of pregnancy: Secondary | ICD-10-CM | POA: Diagnosis not present

## 2023-12-06 DIAGNOSIS — O09899 Supervision of other high risk pregnancies, unspecified trimester: Secondary | ICD-10-CM | POA: Insufficient documentation

## 2023-12-06 MED ORDER — ASPIRIN 81 MG PO TBEC: 81.0000 mg | DELAYED_RELEASE_TABLET | Freq: Every day | ORAL | 12 refills | Status: DC

## 2023-12-06 NOTE — Progress Notes (Signed)
Subjective:   Claudia Miller is a 32 y.o. [id number] at [redacted]w[redacted]d by LMP being seen today for her first obstetrical visit.  Her obstetrical history is significant for  None . Patient does not intend to breast feed. Pregnancy history fully reviewed.  Patient reports no bleeding, no contractions, no cramping, and no leaking.  HISTORY: OB History  Gravida Para Term Preterm AB Living  5 4 2 2  0 4  SAB IAB Ectopic Multiple Live Births  0 0 0 0 4    # Outcome Date GA Lbr Len/2nd Weight Sex Type Anes PTL Lv  5 Current           4 Preterm 11/13/20 [redacted]w[redacted]d 14:00 / 00:07 4 lb 14 oz (2.211 kg) F VBAC EPI  LIV     Birth Comments: WNL     Name: Sobol,GIRL Amily     Apgar1: 9  Apgar5: 9  3 Term 01/23/19 [redacted]w[redacted]d  6 lb 4.4 oz (2.845 kg) F CS-LTranv EPI  LIV     Name: Lebeck,GIRL Severina     Apgar1: 9  Apgar5: 9  2 Term 07/27/14 [redacted]w[redacted]d 14:10 / 00:25 6 lb 0.8 oz (2.745 kg) M Vag-Spont EPI  LIV     Name: Timm,BOY Quetzali     Apgar1: 9  Apgar5: 9  1 Preterm 03/10/11 [redacted]w[redacted]d  4 lb 8 oz (2.041 kg) F Vag-Spont EPI N LIV   Last pap smear was  11/21/23 and was normal 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 04/15/2020   Supervision of normal pregnancy, antepartum 07/12/2018    Nursing Staff Provider Office Location  [facility] Dating   [place] 6.4 on 06/04/18 Language  English  Anatomy US  Normal Flu Vaccine   08/15/18 Genetic Screen  NIPS: normal  TDaP vaccine   11/30/18 Hgb A1C or  GTT Early  Third trimester nl 2 hour  Rhogam   n/a   LAB RESULTS  Feeding Plan Bottle Blood Type A/Positive/-- (07/17 1211)  Contraception Depo Antibody Negative (07/17 1211) Circumcision  girl  Rubell   Trichomoniasis 08/09/2018   [x]  TOC   Past Surgical History:  Procedure Laterality Date   CESAREAN SECTION N/A 01/23/2019   Procedure: CESAREAN SECTION;  Surgeon: Tereso Newcomer, MD;  Location: [facility];  Service: Obstetrics;  Laterality:  N/A;   Family History  Problem Relation Age of Onset   Asthma Mother    Diabetes Mother    Hypertension Mother    Cancer Father    Hypertension Father    Asthma Sister    Hypertension Sister    Asthma Brother    Hypertension Brother    Social History   Tobacco Use   Smoking status: Former    Current packs/day: 0.00    Types: Cigarettes    Quit date: 05/31/2018    Years since quitting: 5.5   Smokeless tobacco: Never  Vaping Use   Vaping status: Never Used  Substance Use Topics   Alcohol use: No   Drug use: Yes    Types: Marijuana    Comment: 09/04/2023   Allergies  Allergen Reactions   Fish Allergy Anaphylaxis   Current Outpatient Medications on File Prior to Visit  Medication Sig Dispense Refill   cyclobenzaprine (FLEXERIL) 10 MG tablet Take 1 tablet (10 mg total) by mouth 2 (two) times daily as needed for muscle spasms. 20  tablet 0   lidocaine (LIDODERM) 5 % Place 1 patch onto the skin daily. Remove & Discard patch within 12 hours or as directed by MD 30 patch 0   ondansetron (ZOFRAN-ODT) 4 MG disintegrating tablet Take 1 tablet (4 mg total) by mouth every 6 (six) hours as needed for nausea. 20 tablet 0   Prenatal Vit-Fe Fumarate-FA (PRENATAL VITAMIN) 27-0.8 MG TABS Take 1 tablet by mouth daily. 30 tablet 12   promethazine (PHENERGAN) 25 MG tablet Take 1 tablet (25 mg total) by mouth every 6 (six) hours as needed for nausea or vomiting. 30 tablet 2   No current facility-administered medications on file prior to visit.     Exam   Vitals:   12/06/23 1501  BP: (!) 112/50  Pulse: 78  Weight: 204 lb 11.2 oz (92.9 kg)   Fetal Heart Rate (bpm): 156  Uterus:     Pelvic Exam: Perineum: no hemorrhoids, normal perineum   Vulva: normal external genitalia, no lesions   Vagina:  normal mucosa, normal discharge   Cervix: no lesions and normal, pap smear done.    Adnexa: normal adnexa and no mass, fullness, tenderness   Bony Pelvis: average  System: General:  well-developed, well-nourished female in no acute distress   Breast:  normal appearance, no masses or tenderness   Skin: normal coloration and turgor, no rashes   Neurologic: oriented, normal, negative, normal mood   Extremities: normal strength, tone, and muscle mass, ROM of all joints is normal   HEENT PERRLA, extraocular movement intact and sclera clear, anicteric   Mouth/Teeth mucous membranes moist, pharynx normal without lesions and dental hygiene good   Neck supple and no masses   Cardiovascular: regular rate and rhythm   Respiratory:  no respiratory distress, normal breath sounds   Abdomen: soft, non-tender; bowel sounds normal; no masses,  no organomegaly     Assessment:   Pregnancy: [id number] Patient Active Problem List   Diagnosis Date Noted   History of preterm delivery, currently pregnant 12/06/2023   Obesity affecting pregnancy in third trimester, antepartum 05/27/2020   Supervision of high risk pregnancy, antepartum 05/20/2020   Marijuana use 04/15/2020   History of cesarean delivery, currently pregnant 01/23/2019     Plan:  1. History of preterm delivery, currently pregnant At [redacted] weeks 6 days - [place] MFM OB DETAIL +14 WK; Future  2. Supervision of high risk pregnancy, antepartum FHR and BP appropriate today Continue routine prenatal care Patient on ASA  3. Marijuana use Patient reports occasional marijuana use.  States that she uses it because of her nausea and vomiting to help with appetite.  Reports that the nausea and vomiting also improves with hot showers.  Discussed cannabinoid induced hyperemesis and patient will work on cessation.   Initial labs drawn. Continue prenatal vitamins. Genetic Screening discussed, NIPS: results reviewed. Ultrasound discussed; fetal anatomic survey: results reviewed. Problem list reviewed and updated. The nature of [facility] with multiple MDs and other Advanced Practice Providers was  explained to patient; also emphasized that residents, students are part of our team. Routine obstetric precautions reviewed. No follow-ups on file.

## 2023-12-07 ENCOUNTER — Encounter: Payer: Self-pay | Admitting: *Deleted

## 2023-12-12 DIAGNOSIS — Z98891 History of uterine scar from previous surgery: Secondary | ICD-10-CM | POA: Insufficient documentation

## 2023-12-16 ENCOUNTER — Ambulatory Visit (HOSPITAL_BASED_OUTPATIENT_CLINIC_OR_DEPARTMENT_OTHER): Payer: BLUE CROSS/BLUE SHIELD | Admitting: Maternal & Fetal Medicine

## 2023-12-16 ENCOUNTER — Ambulatory Visit: Payer: BLUE CROSS/BLUE SHIELD

## 2023-12-16 ENCOUNTER — Other Ambulatory Visit: Payer: Self-pay

## 2023-12-16 ENCOUNTER — Other Ambulatory Visit: Payer: Self-pay | Admitting: *Deleted

## 2023-12-16 ENCOUNTER — Ambulatory Visit: Payer: BLUE CROSS/BLUE SHIELD | Admitting: *Deleted

## 2023-12-16 VITALS — BP 118/48 | HR 79

## 2023-12-16 DIAGNOSIS — O99213 Obesity complicating pregnancy, third trimester: Secondary | ICD-10-CM | POA: Diagnosis not present

## 2023-12-16 DIAGNOSIS — O09899 Supervision of other high risk pregnancies, unspecified trimester: Secondary | ICD-10-CM

## 2023-12-16 DIAGNOSIS — O34219 Maternal care for unspecified type scar from previous cesarean delivery: Secondary | ICD-10-CM

## 2023-12-16 DIAGNOSIS — O0990 Supervision of high risk pregnancy, unspecified, unspecified trimester: Secondary | ICD-10-CM | POA: Insufficient documentation

## 2023-12-16 DIAGNOSIS — O0932 Supervision of pregnancy with insufficient antenatal care, second trimester: Secondary | ICD-10-CM

## 2023-12-16 DIAGNOSIS — O09212 Supervision of pregnancy with history of pre-term labor, second trimester: Secondary | ICD-10-CM | POA: Diagnosis not present

## 2023-12-16 DIAGNOSIS — Z98891 History of uterine scar from previous surgery: Secondary | ICD-10-CM | POA: Diagnosis present

## 2023-12-16 DIAGNOSIS — E669 Obesity, unspecified: Secondary | ICD-10-CM | POA: Diagnosis not present

## 2023-12-16 DIAGNOSIS — O99212 Obesity complicating pregnancy, second trimester: Secondary | ICD-10-CM

## 2023-12-16 DIAGNOSIS — Z3A26 26 weeks gestation of pregnancy: Secondary | ICD-10-CM | POA: Diagnosis not present

## 2023-12-16 NOTE — Progress Notes (Signed)
Patient information  Patient Name: Claudia Miller  Patient MRN:   [id number]  Referring practice: MFM Referring Provider: [facility])  MFM CONSULT  Aubery NERVA PALMBERG is a 32 y.o. [phone] at [redacted]w[redacted]d here for ultrasound and consultation. Patient Active Problem List   Diagnosis Date Noted   History of VBAC 12/12/2023   History of preterm delivery, currently pregnant 12/06/2023   Obesity affecting pregnancy in third trimester, antepartum 05/27/2020   Supervision of high risk pregnancy, antepartum 05/20/2020   History of cesarean delivery, currently pregnant 01/23/2019   RE hx of PTB x 2 (G1 and G4, 36w+): Patient reports a history of 2 spontaneous preterm birth around 36 weeks.  She never required progesterone or cerclage in the subsequent pregnancies.  She reports that she went into spontaneous labor and does not know if anything specifically triggered her labor.  Since her preterm births were greater than 34 weeks and she has had 2 term deliveries there is no further workup or intervention required other than symptom monitoring.  Cervical length was normal on transabdominal ultrasound today.  RE pregravid BMI of 36: Obesity complicates nearly every aspect of pregnancy care.  Weight gain should be limited to an 10-20 pounds.  Serial growth ultrasounds be performed to assess fetal weight.  RE previous cesarean delivery: Cesarean delivery in her third pregnancy followed by a VBAC.  Patient desires to attempt a trial of labor.  Sonographic findings Single intrauterine pregnancy at 26w 1d  Fetal cardiac activity:  Observed and appears normal. Presentation: Transverse, head to maternal left. The anatomic structures that were well seen appear normal without evidence of soft markers. Due to poor acoustic windows some structures remain suboptimally visualized. Fetal biometry shows the estimated fetal weight at the 33 percentile.  Amniotic fluid:  MVP: 6.32  cm. Placenta: Anterior. Adnexa: No abnormality visualized. Cervical length: 3.2 cm.  Recommendations -EDD is Estimated Date of Delivery: 03/22/24 based on an early [place]. -Detailed ultrasound was done today without abnormalities but much of the anatomy is suboptimally visualized. -Baseline preeclampsia labs: CMP, CBC and urine protein/creatinine ratio if not previously completed.  -Aspirin 81 mg for preeclampsia prophylasis -Follow-up anatomy and fetal growth in 4 to 6 weeks -Serial growth ultrasounds starting around 28 weeks to monitor for fetal growth restriction -Delivery timing pending clinical course but likely around [redacted] weeks gestation via TOLAC. -Continue routine prenatal care with referring OB provider  Review of Systems: A review of systems was performed and was negative except per HPI   Vitals and Physical Exam    12/16/2023    8:28 AM 12/16/2023    7:39 AM 12/06/2023    3:01 PM  Vitals with BMI  Weight   204 lbs 11 oz  Systolic 102 118 147  Diastolic 33 48 50  Pulse 69 79 78   Sitting comfortably on the sonogram table Nonlabored breathing Normal rate and rhythm Abdomen is nontender  Past pregnancies OB History  Gravida Para Term Preterm AB Living  5 4 2 2  0 4  SAB IAB Ectopic Multiple Live Births  0 0 0 0 4    # Outcome Date GA Lbr Len/2nd Weight Sex Type Anes PTL Lv  5 Current           4 Preterm 11/13/20 [redacted]w[redacted]d 14:00 / 00:07 2.211 kg F VBAC EPI  LIV     Birth Comments: WNL  3 Term 01/23/19 [redacted]w[redacted]d  2.845 kg F CS-LTranv EPI  LIV  2 Term 07/27/14 [redacted]w[redacted]d 14:10 / 00:25 2.745 kg M Vag-Spont EPI  LIV  1 Preterm 03/10/11 [redacted]w[redacted]d  2.041 kg F Vag-Spont EPI N LIV     I spent 30 minutes reviewing the patients chart, including labs and images as well as counseling the patient about her medical conditions. Greater than 50% of the time was spent in direct face-to-face patient counseling.  Braxton Feathers  MFM, [facility]   12/16/2023  9:18 AM

## 2023-12-26 ENCOUNTER — Inpatient Hospital Stay (HOSPITAL_COMMUNITY)
Admission: AD | Admit: 2023-12-26 | Discharge: 2023-12-26 | Disposition: A | Discharge status: Home / Self Care | Payer: BLUE CROSS/BLUE SHIELD | Attending: Obstetrics and Gynecology | Admitting: Obstetrics and Gynecology

## 2023-12-26 ENCOUNTER — Other Ambulatory Visit: Payer: Self-pay

## 2023-12-26 ENCOUNTER — Encounter (HOSPITAL_COMMUNITY): Payer: Self-pay | Admitting: Obstetrics and Gynecology

## 2023-12-26 DIAGNOSIS — O26852 Spotting complicating pregnancy, second trimester: Secondary | ICD-10-CM

## 2023-12-26 DIAGNOSIS — O26892 Other specified pregnancy related conditions, second trimester: Secondary | ICD-10-CM | POA: Diagnosis present

## 2023-12-26 DIAGNOSIS — O34219 Maternal care for unspecified type scar from previous cesarean delivery: Secondary | ICD-10-CM

## 2023-12-26 DIAGNOSIS — Z3A27 27 weeks gestation of pregnancy: Secondary | ICD-10-CM | POA: Diagnosis not present

## 2023-12-26 DIAGNOSIS — O4702 False labor before 37 completed weeks of gestation, second trimester: Secondary | ICD-10-CM | POA: Diagnosis not present

## 2023-12-26 DIAGNOSIS — R109 Unspecified abdominal pain: Secondary | ICD-10-CM | POA: Diagnosis not present

## 2023-12-26 LAB — URINALYSIS, ROUTINE W REFLEX MICROSCOPIC
Bilirubin Urine: NEGATIVE
Glucose, UA: NEGATIVE mg/dL
Hgb urine dipstick: NEGATIVE
Ketones, ur: 5 mg/dL — AB
Leukocytes,Ua: NEGATIVE
Nitrite: NEGATIVE
Protein, ur: NEGATIVE mg/dL
Specific Gravity, Urine: 1.026 (ref 1.005–1.030)
pH: 6 (ref 5.0–8.0)

## 2023-12-26 LAB — WET PREP, GENITAL
Clue Cells Wet Prep HPF POC: NONE SEEN
Sperm: NONE SEEN
Trich, Wet Prep: NONE SEEN
WBC, Wet Prep HPF POC: <10 (ref <10)
Yeast Wet Prep HPF POC: NONE SEEN

## 2023-12-26 MED ORDER — NIFEDIPINE 10 MG PO CAPS
10.0000 mg | ORAL_CAPSULE | ORAL | Status: DC | PRN
  Administered 2023-12-26: 10 mg via ORAL
  Filled 2023-12-26: qty 1

## 2023-12-26 MED ORDER — ONDANSETRON 4 MG PO TBDP
4.0000 mg | ORAL_TABLET | Freq: Once | ORAL | Status: AC
  Administered 2023-12-26: 4 mg via ORAL
  Filled 2023-12-26: qty 1

## 2023-12-26 MED ORDER — NIFEDIPINE 10 MG PO CAPS: 10.0000 mg | ORAL_CAPSULE | Freq: Four times a day (QID) | ORAL | 0 refills | Status: DC | PRN

## 2023-12-26 NOTE — MAU Provider Note (Incomplete)
CC:  Chief Complaint  Patient presents with   Contractions   Vaginal Bleeding     Event Date/Time   First Provider Initiated Contact with Patient 12/26/23 1959      HPI: Claudia Miller is a 32 y.o. year old [id number] female at [redacted]w[redacted]d weeks gestation who presents to MAU reporting contractions every *** minutes since ***.  Associated Sx:  Vaginal bleeding: *** Leaking of fluid: *** Fetal movement: ***  O: Patient Vitals for the past 24 hrs:  BP Temp Temp src Pulse Resp SpO2 Height Weight  12/26/23 2047 110/68 -- -- 77 -- -- -- --  12/26/23 1926 (!) 123/54 98.2 F (36.8 C) Oral 91 18 97 % 5\' 1"  (1.549 m) 93.6 kg    General: NAD Heart: Regular rate Lungs: Normal rate and effort Abd: Soft, NT, Gravid, S=D Pelvic: NEFG, ***pooling, *** blood.  Dilation: Closed Exam by:: Dorathy Kinsman, CNM  EFM: 150, Moderate variability, 10x10 accelerations, no decelerations Toco: UI  Orders Placed This Encounter  Procedures   Wet prep, genital   Fetal fibronectin   Urinalysis, Routine w reflex microscopic -Urine, Clean Catch   Lab instructions   Meds ordered this encounter  Medications   NIFEdipine (PROCARDIA) capsule 10 mg   ondansetron (ZOFRAN-ODT) disintegrating tablet 4 mg    A: [redacted]w[redacted]d week IUP *** labor/Braxton Hicks FHR reactive  P: Admit to L&D per consult w/ Warden Fillers, MD/Discharge home in stable condition per consult with Warden Fillers, MD. Labor/Preterm labor precautions and fetal kick counts. Follow-up as scheduled for prenatal visit or sooner as needed if symptoms worsen. Return to maternity admissions as needed if symptoms worsen.  Katrinka Blazing, [place], [place] 12/26/2023 8:59 PM  3

## 2023-12-26 NOTE — MAU Note (Signed)
.  Claudia Miller is a 32 y.o. at [redacted]w[redacted]d here in MAU reporting: here by EMS - went to bathroom and noticed she was bleeding. Also reporting cramping/contractions. "When I use the bathroom it feels like I got to push". States bleeding was in her underwear, but denies needing to use a pad for it. Denies recent intercourse. Denies LOF. +FM   Onset of complaint: 1900 Pain score: 8 Vitals:   12/26/23 1926  BP: (!) 123/54  Pulse: 91  Resp: 18  Temp: 98.2 F (36.8 C)  SpO2: 97%     FHT:158 Lab orders placed from triage: UA

## 2023-12-28 NOTE — L&D Delivery Note (Addendum)
 OB/GYN Faculty Practice Delivery Note  Claudia Miller is a 33 y.o. [id number] s/p VBAC at [redacted]w[redacted]d. She was admitted for SOL.   ROM: 0h 80m with moderate meconium-stained fluid GBS Status:  Negative Maximum Maternal Temperature: Temp (24hrs), Avg:98.4 F (36.9 C), Min:98.2 F (36.8 C), Max:98.6 F (37 C)   Labor Progress: Initial SVE: 6/90/-2.  No augmentation  required. She then quickly progressed to complete.   Delivery Date/Time: 03/16/24 0328 Delivery: Called to room and patient was feeling urge to push. Found to be complete. Pushed for approximately 10 minutes. Head delivered OA to LOA. Two nuchal cords present. One reduced at the perineum, one reduced after delivery. Shoulder and body delivered in usual fashion. Infant with spontaneous cry, placed on mother's abdomen, dried and stimulated. Cord clamped x 2 after +1-minute delay, and cut by patient's friend. Cord gases not obtained. Cord blood drawn. Placenta delivered spontaneously with gentle cord traction. Fundus firm with massage and Pitocin. Labia, perineum, and vagina inspected with no lacerations. Mom and baby to postpartum. Baby Weight: pending  Placenta: 3 vessel, intact. Sent to L&D Complications: None Lacerations: None EBL: 250 mL Anesthesia: epidural  Infant: baby boy APGAR (1 MIN): 8 APGAR (5 MINS): 9 APGAR (10 MINS):    Joanne Gavel, MD [facility] Fellow, [facility] 03/16/2024, 5:14 AM

## 2024-01-03 NOTE — Progress Notes (Deleted)
 PRENATAL VISIT NOTE  Subjective:  Claudia Miller is a 33 y.o. [phone] at [redacted]w[redacted]d being seen today for ongoing prenatal care.  She is currently monitored for the following issues for this low-risk pregnancy and has History of cesarean delivery, currently pregnant; Supervision of high risk pregnancy, antepartum; Obesity affecting pregnancy in third trimester, antepartum; History of preterm delivery, currently pregnant; and History of VBAC on their problem list.  Patient reports {[id number]}.   .  .   . Denies leaking of fluid.   The following portions of the patient's history were reviewed and updated as appropriate: allergies, current medications, past family history, past medical history, past social history, past surgical history and problem list.   Objective:  There were no vitals filed for this visit.  Fetal Status:           General:  Alert, oriented and cooperative. Patient is in no acute distress.  Skin: Skin is warm and dry. No rash noted.   Cardiovascular: Normal heart rate noted  Respiratory: Normal respiratory effort, no problems with respiration noted  Abdomen: Soft, gravid, appropriate for gestational age.        Pelvic: Cervical exam deferred        Extremities: Normal range of motion.     Mental Status: Normal mood and affect. Normal behavior. Normal judgment and thought content.   Assessment and Plan:  Pregnancy: [id number] at [redacted]w[redacted]d 1. Supervision of high risk pregnancy, antepartum (Primary) Had been seen in MAU and given Procardia  for PT ctxns.  28 week labs (normal 3 hour on 12/6). Will also check baseline protein since not yet done. CMP wnl.   2. Obesity affecting pregnancy in third trimester, unspecified obesity type  3. History of VBAC C-section with G3 due to NRFHT. Subsequent VBAC. 2 prior SVDs.  - We discussed the risks associated with repeat c-section: bleeding, infection, injury to surrounding organs/tissues I.e. bowel/bladder, development of scar tissue,  wound complications such as wound separation or infection, need for additional surgery, percreta/acreta - We discussed the risks associated with TOLAC: risk of it being unsuccessful, specially in the context of her history, the risks in general of a vaginal delivery (prolapse, SUI, differences in recovery, pelvic floor dysfunction, etc), and the risk of uterine rupture. We discussed with the risk of uterine rupture that while rare it is not easily predicted, that it is a surgical emergency, and it can be potentially catastrophic for mom and baby. We discussed if uterine rupture that it may necessitate hysterectomy if the rupture caused issues with bleeding that could not be managed with other surgical options.  - After counseling, the patient was given the opportunity to ask questions and all questions answered.  - After considering her options, she would like {Blank single:[id number]::to TOLAC,to have a repeat c-section,take some time to consider the options} - Information provided to the patient   4. History of preterm delivery, currently pregnant Delivered at [redacted]w[redacted]d.   5. Pregnancy with 28 completed weeks gestation   Preterm labor symptoms and general obstetric precautions including but not limited to vaginal bleeding, contractions, leaking of fluid and fetal movement were reviewed in detail with the patient. Please refer to After Visit Summary for other counseling recommendations.   No follow-ups on file.  Future Appointments  Date Time Provider Department Center  01/04/2024  1:15 PM Cleatus Moccasin, MD [facility]  01/16/2024  9:15 AM [facility] NURSE [facility]  01/16/2024  9:30 AM [facility] US3 [facility]    [place]  Cleatus, MD

## 2024-01-04 ENCOUNTER — Encounter: Payer: BLUE CROSS/BLUE SHIELD | Admitting: Obstetrics and Gynecology

## 2024-01-04 DIAGNOSIS — O09899 Supervision of other high risk pregnancies, unspecified trimester: Secondary | ICD-10-CM

## 2024-01-04 DIAGNOSIS — O0990 Supervision of high risk pregnancy, unspecified, unspecified trimester: Secondary | ICD-10-CM

## 2024-01-04 DIAGNOSIS — Z3A28 28 weeks gestation of pregnancy: Secondary | ICD-10-CM

## 2024-01-04 DIAGNOSIS — O99213 Obesity complicating pregnancy, third trimester: Secondary | ICD-10-CM

## 2024-01-04 DIAGNOSIS — Z98891 History of uterine scar from previous surgery: Secondary | ICD-10-CM

## 2024-01-16 ENCOUNTER — Other Ambulatory Visit: Payer: Self-pay | Admitting: *Deleted

## 2024-01-16 ENCOUNTER — Ambulatory Visit: Payer: BLUE CROSS/BLUE SHIELD | Admitting: *Deleted

## 2024-01-16 ENCOUNTER — Ambulatory Visit: Payer: BLUE CROSS/BLUE SHIELD | Attending: Maternal & Fetal Medicine

## 2024-01-16 ENCOUNTER — Ambulatory Visit: Payer: BLUE CROSS/BLUE SHIELD

## 2024-01-16 ENCOUNTER — Other Ambulatory Visit: Payer: Self-pay

## 2024-01-16 VITALS — BP 132/65 | HR 83

## 2024-01-16 DIAGNOSIS — O09899 Supervision of other high risk pregnancies, unspecified trimester: Secondary | ICD-10-CM

## 2024-01-16 DIAGNOSIS — O99212 Obesity complicating pregnancy, second trimester: Secondary | ICD-10-CM | POA: Diagnosis present

## 2024-01-16 DIAGNOSIS — O34219 Maternal care for unspecified type scar from previous cesarean delivery: Secondary | ICD-10-CM

## 2024-01-16 DIAGNOSIS — E669 Obesity, unspecified: Secondary | ICD-10-CM | POA: Diagnosis not present

## 2024-01-16 DIAGNOSIS — O99320 Drug use complicating pregnancy, unspecified trimester: Secondary | ICD-10-CM

## 2024-01-16 DIAGNOSIS — O09213 Supervision of pregnancy with history of pre-term labor, third trimester: Secondary | ICD-10-CM | POA: Diagnosis not present

## 2024-01-16 DIAGNOSIS — Z3A30 30 weeks gestation of pregnancy: Secondary | ICD-10-CM

## 2024-01-16 DIAGNOSIS — F1290 Cannabis use, unspecified, uncomplicated: Secondary | ICD-10-CM

## 2024-01-16 DIAGNOSIS — Z362 Encounter for other antenatal screening follow-up: Secondary | ICD-10-CM

## 2024-01-16 DIAGNOSIS — O99213 Obesity complicating pregnancy, third trimester: Secondary | ICD-10-CM | POA: Diagnosis not present

## 2024-01-16 DIAGNOSIS — O0933 Supervision of pregnancy with insufficient antenatal care, third trimester: Secondary | ICD-10-CM

## 2024-01-20 ENCOUNTER — Other Ambulatory Visit: Payer: Self-pay

## 2024-01-20 ENCOUNTER — Ambulatory Visit (INDEPENDENT_AMBULATORY_CARE_PROVIDER_SITE_OTHER): Payer: BLUE CROSS/BLUE SHIELD | Admitting: Obstetrics & Gynecology

## 2024-01-20 VITALS — BP 107/74 | HR 90 | Wt 209.1 lb

## 2024-01-20 DIAGNOSIS — O0993 Supervision of high risk pregnancy, unspecified, third trimester: Secondary | ICD-10-CM | POA: Diagnosis not present

## 2024-01-20 DIAGNOSIS — Z98891 History of uterine scar from previous surgery: Secondary | ICD-10-CM

## 2024-01-20 DIAGNOSIS — O0990 Supervision of high risk pregnancy, unspecified, unspecified trimester: Secondary | ICD-10-CM

## 2024-01-20 DIAGNOSIS — O09899 Supervision of other high risk pregnancies, unspecified trimester: Secondary | ICD-10-CM

## 2024-01-20 DIAGNOSIS — O09893 Supervision of other high risk pregnancies, third trimester: Secondary | ICD-10-CM | POA: Diagnosis not present

## 2024-01-20 DIAGNOSIS — O34219 Maternal care for unspecified type scar from previous cesarean delivery: Secondary | ICD-10-CM

## 2024-01-20 DIAGNOSIS — Z3A31 31 weeks gestation of pregnancy: Secondary | ICD-10-CM | POA: Diagnosis not present

## 2024-01-20 MED ORDER — BISACODYL 10 MG RE SUPP: 10.0000 mg | RECTAL | 0 refills | Status: DC | PRN

## 2024-01-20 NOTE — Progress Notes (Signed)
   PRENATAL VISIT NOTE  Subjective:  Claudia Miller is a 33 y.o. [phone] at [redacted]w[redacted]d being seen today for ongoing prenatal care.  She is currently monitored for the following issues for this high-risk pregnancy and has History of cesarean delivery, currently pregnant; Supervision of high risk pregnancy, antepartum; Obesity affecting pregnancy in third trimester, antepartum; History of preterm delivery, currently pregnant; and History of VBAC on their problem list.  Patient reports occasional contractions and constipation .  Contractions: Irritability. Vag. Bleeding: None.  Movement: Present. Denies leaking of fluid.   The following portions of the patient's history were reviewed and updated as appropriate: allergies, current medications, past family history, past medical history, past social history, past surgical history and problem list.   Objective:   Vitals:   01/20/24 0850  BP: 107/74  Pulse: 90  Weight: 209 lb 1.6 oz (94.8 kg)    Fetal Status: Fetal Heart Rate (bpm): 138   Movement: Present  Presentation: Vertex  General:  Alert, oriented and cooperative. Patient is in mild discomfort  Skin: Skin is warm and dry. No rash noted.   Cardiovascular: Normal heart rate noted  Respiratory: Normal respiratory effort, no problems with respiration noted  Abdomen: Soft, gravid, appropriate for gestational age.  Pain/Pressure: Present     Pelvic: Cervical exam performed in the presence of a chaperone Dilation: Closed Effacement (%): 20 Station: Ballotable  Extremities: Normal range of motion.  Edema: Trace  Mental Status: Normal mood and affect. Normal behavior. Normal judgment and thought content.   Assessment and Plan:  Pregnancy: [id number] at [redacted]w[redacted]d 1. History of VBAC (Primary) Plans TOLAC  2. History of cesarean delivery, currently pregnant   3. Supervision of high risk pregnancy, antepartum Irregular contractions, c/o constipation. PTL precautions and will go to MAU if no relief -  bisacodyl (DULCOLAX) 10 MG suppository; Place 1 suppository (10 mg total) rectally as needed for moderate constipation.  Dispense: 12 suppository; Refill: 0  4. History of preterm delivery, currently pregnant H/o delivery at 36.6 weeks  Preterm labor symptoms and general obstetric precautions including but not limited to vaginal bleeding, contractions, leaking of fluid and fetal movement were reviewed in detail with the patient. Please refer to After Visit Summary for other counseling recommendations.   Return in about 2 weeks (around 02/03/2024).  Future Appointments  Date Time Provider Department Center  02/27/2024 10:30 AM [facility]    Scheryl Darter, MD

## 2024-01-20 NOTE — Progress Notes (Signed)
Pt reports cramping & tightning

## 2024-02-06 ENCOUNTER — Encounter: Payer: BLUE CROSS/BLUE SHIELD | Admitting: Obstetrics & Gynecology

## 2024-02-26 ENCOUNTER — Other Ambulatory Visit: Payer: Self-pay

## 2024-02-26 ENCOUNTER — Encounter (HOSPITAL_COMMUNITY): Payer: Self-pay | Admitting: Obstetrics & Gynecology

## 2024-02-26 ENCOUNTER — Inpatient Hospital Stay (HOSPITAL_COMMUNITY)
Admission: AD | Admit: 2024-02-26 | Discharge: 2024-02-26 | Disposition: A | Discharge status: Home / Self Care | Attending: Obstetrics & Gynecology | Admitting: Obstetrics & Gynecology

## 2024-02-26 DIAGNOSIS — O4703 False labor before 37 completed weeks of gestation, third trimester: Secondary | ICD-10-CM | POA: Diagnosis present

## 2024-02-26 DIAGNOSIS — B3731 Acute candidiasis of vulva and vagina: Secondary | ICD-10-CM

## 2024-02-26 DIAGNOSIS — Z3A36 36 weeks gestation of pregnancy: Secondary | ICD-10-CM | POA: Diagnosis not present

## 2024-02-26 DIAGNOSIS — O23593 Infection of other part of genital tract in pregnancy, third trimester: Secondary | ICD-10-CM | POA: Insufficient documentation

## 2024-02-26 DIAGNOSIS — O99891 Other specified diseases and conditions complicating pregnancy: Secondary | ICD-10-CM | POA: Diagnosis not present

## 2024-02-26 DIAGNOSIS — O98813 Other maternal infectious and parasitic diseases complicating pregnancy, third trimester: Secondary | ICD-10-CM | POA: Diagnosis not present

## 2024-02-26 DIAGNOSIS — R0981 Nasal congestion: Secondary | ICD-10-CM

## 2024-02-26 DIAGNOSIS — R197 Diarrhea, unspecified: Secondary | ICD-10-CM | POA: Diagnosis not present

## 2024-02-26 DIAGNOSIS — M545 Low back pain, unspecified: Secondary | ICD-10-CM

## 2024-02-26 LAB — RESP PANEL BY RT-PCR (RSV, FLU A&B, COVID)  RVPGX2
Influenza A by PCR: NEGATIVE
Influenza B by PCR: NEGATIVE
Resp Syncytial Virus by PCR: NEGATIVE
SARS Coronavirus 2 by RT PCR: NEGATIVE

## 2024-02-26 LAB — WET PREP, GENITAL
Clue Cells Wet Prep HPF POC: NONE SEEN
Sperm: NONE SEEN
Trich, Wet Prep: NONE SEEN
WBC, Wet Prep HPF POC: >=10 — AB (ref <10)

## 2024-02-26 LAB — URINALYSIS, ROUTINE W REFLEX MICROSCOPIC
Bilirubin Urine: NEGATIVE
Glucose, UA: 50 mg/dL — AB
Hgb urine dipstick: NEGATIVE
Ketones, ur: NEGATIVE mg/dL
Leukocytes,Ua: NEGATIVE
Nitrite: NEGATIVE
Protein, ur: NEGATIVE mg/dL
Specific Gravity, Urine: 1.023 (ref 1.005–1.030)
pH: 5 (ref 5.0–8.0)

## 2024-02-26 MED ORDER — CYCLOBENZAPRINE HCL 10 MG PO TABS: 10.0000 mg | ORAL_TABLET | Freq: Two times a day (BID) | ORAL | 0 refills | Status: DC | PRN

## 2024-02-26 MED ORDER — ACETAMINOPHEN 325 MG PO TABS: 650.0000 mg | ORAL_TABLET | Freq: Four times a day (QID) | ORAL | 0 refills | Status: DC | PRN

## 2024-02-26 MED ORDER — CYCLOBENZAPRINE HCL 5 MG PO TABS
10.0000 mg | ORAL_TABLET | Freq: Once | ORAL | Status: AC
  Administered 2024-02-26: 10 mg via ORAL
  Filled 2024-02-26: qty 2

## 2024-02-26 MED ORDER — TERCONAZOLE 0.8 % VA CREA
1.0000 | TOPICAL_CREAM | Freq: Every day | VAGINAL | 0 refills | Status: DC
Start: 2024-02-26 — End: 2024-03-18

## 2024-02-26 MED ORDER — ACETAMINOPHEN 500 MG PO TABS
1000.0000 mg | ORAL_TABLET | Freq: Once | ORAL | Status: AC
  Administered 2024-02-26: 1000 mg via ORAL
  Filled 2024-02-26: qty 2

## 2024-02-26 NOTE — MAU Provider Note (Signed)
 Chief Complaint:  No chief complaint on file.   HPI      Claudia Miller is a 33 y.o. [phone] at [redacted]w[redacted]d who presents to maternity admissions reporting c/o ctx's that started on 3/1. She reports that she had lower back pain that radiates to the front and she took a dose of procardia that she had at home from a previous visit , with no resolve. She denies any fever, Chills, has had diarrhea x 3-4 episodes today. C/o nasal congestion and muscle skeletal pain and reports she has not taken any medication thus far other than the procardia x 1 dose. Denies VB, LOF and reports good FM's  Pregnancy Course: [facility]  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 04/15/2020   Supervision of normal pregnancy, antepartum 07/12/2018    Nursing Staff Provider Office Location  [facility] Dating   [place] 6.4 on 06/04/18 Language  English  Anatomy US  Normal Flu Vaccine   08/15/18 Genetic Screen  NIPS: normal  TDaP vaccine   11/30/18 Hgb A1C or  GTT Early  Third trimester nl 2 hour  Rhogam   n/a   LAB RESULTS  Feeding Plan Bottle Blood Type A/Positive/-- (07/17 1211)  Contraception Depo Antibody Negative (07/17 1211) Circumcision  girl  Rubell   Trichomoniasis 08/09/2018   [x]  TOC   OB History  Gravida Para Term Preterm AB Living  5 4 2 2  0 4  SAB IAB Ectopic Multiple Live Births  0 0 0 0 4    # Outcome Date GA Lbr Len/2nd Weight Sex Type Anes PTL Lv  5 Current           4 Preterm 11/13/20 [redacted]w[redacted]d 14:00 / 00:07 2211 g F VBAC EPI  LIV     Birth Comments: WNL  3 Term 01/23/19 [redacted]w[redacted]d  2845 g F CS-LTranv EPI  LIV  2 Term 07/27/14 [redacted]w[redacted]d 14:10 / 00:25 2745 g M Vag-Spont EPI  LIV  1 Preterm 03/10/11 [redacted]w[redacted]d  2041 g F Vag-Spont EPI N LIV   Past Surgical History:  Procedure Laterality Date   CESAREAN SECTION N/A 01/23/2019   Procedure: CESAREAN SECTION;  Surgeon: Tereso Newcomer, MD;  [facility];  Service: Obstetrics;   Laterality: N/A;   Family History  Problem Relation Age of Onset   Heart disease Mother    Asthma Mother    Diabetes Mother    Hypertension Mother    Cancer Father    Hypertension Father    Asthma Sister    Hypertension Sister    Asthma Brother    Hypertension Brother    Social History   Tobacco Use   Smoking status: Former    Types: Cigars   Smokeless tobacco: Never  Vaping Use   Vaping status: Never Used  Substance Use Topics   Alcohol use: No   Drug use: Not Currently    Types: Marijuana   Allergies  Allergen Reactions   Fish Allergy Anaphylaxis   Medications Prior to Admission  Medication Sig Dispense Refill Last Dose/Taking   NIFEdipine (PROCARDIA) 10 MG capsule Take 1 capsule (10 mg total) by mouth every 6 (six) hours as needed (Greater than 5 contractions per hour). 30 capsule 0 02/25/2024   Prenatal Vit-Fe Fumarate-FA (PRENATAL VITAMIN) 27-0.8 MG TABS Take 1 tablet by mouth daily. 30 tablet 12 02/26/2024   bisacodyl (DULCOLAX) 10 MG suppository  Place 1 suppository (10 mg total) rectally as needed for moderate constipation. 12 suppository 0 More than a month   ondansetron (ZOFRAN-ODT) 4 MG disintegrating tablet Take 1 tablet (4 mg total) by mouth every 6 (six) hours as needed for nausea. 20 tablet 0 More than a month    I have reviewed patient's Past Medical Hx, Surgical Hx, Family Hx, Social Hx, medications and allergies.   ROS  Pertinent items noted in HPI and remainder of comprehensive ROS otherwise negative.   PHYSICAL EXAM  Patient Vitals for the past 24 hrs:  BP Temp Temp src Pulse Resp SpO2 Weight  02/26/24 1801 118/61 -- -- 81 16 -- --  02/26/24 1701 (!) 115/43 98 F (36.7 C) Oral 96 16 -- --  02/26/24 1643 125/65 -- -- 98 16 -- --  02/26/24 1625 132/73 98.3 F (36.8 C) Oral 90 16 100 % 96.2 kg    Constitutional: Well-developed, well-nourished female in no acute distress.  Cardiovascular: normal rate & rhythm, warm and well-perfused, Lungs  BCTA Respiratory: normal effort, no problems with respiration noted, nasal congestion present  GI: Abd soft, non-tender, gravid, no ctx palpable MS: Extremities nontender, no edema, normal ROM Neurologic: Alert and oriented x 4.  GU: no CVA tenderness Pelvic: NEFG, blind swabs sent with GBS CX  Dilation: Closed Exam by:: Misty Stanley NP  Fetal Tracing: @ [phone] Cat 1 reactive Baseline: 140 Variability:moderate  Accelerations: present  Decelerations: Absent Toco: irritable   Labs: Results for orders placed or performed during the hospital encounter of 02/26/24 (from the past 24 hours)  Urinalysis, Routine w reflex microscopic -Urine, Clean Catch     Status: Abnormal   Collection Time: 02/26/24  4:34 PM  Result Value Ref Range   Color, Urine YELLOW YELLOW   APPearance CLEAR CLEAR   Specific Gravity, Urine 1.023 1.005 - 1.030   pH 5.0 5.0 - 8.0   Glucose, UA 50 (A) NEGATIVE mg/dL   Hgb urine dipstick NEGATIVE NEGATIVE   Bilirubin Urine NEGATIVE NEGATIVE   Ketones, ur NEGATIVE NEGATIVE mg/dL   Protein, ur NEGATIVE NEGATIVE mg/dL   Nitrite NEGATIVE NEGATIVE   Leukocytes,Ua NEGATIVE NEGATIVE  Wet prep, genital     Status: Abnormal   Collection Time: 02/26/24  5:12 PM   Specimen: PATH Cytology Cervicovaginal Ancillary Only  Result Value Ref Range   Yeast Wet Prep HPF POC PRESENT (A) NONE SEEN   Trich, Wet Prep NONE SEEN NONE SEEN   Clue Cells Wet Prep HPF POC NONE SEEN NONE SEEN   WBC, Wet Prep HPF POC >=10 (A) <10   Sperm NONE SEEN   Resp panel by RT-PCR (RSV, Flu A&B, Covid) Anterior Nasal Swab     Status: None   Collection Time: 02/26/24  5:16 PM   Specimen: Anterior Nasal Swab  Result Value Ref Range   SARS Coronavirus 2 by RT PCR NEGATIVE NEGATIVE   Influenza A by PCR NEGATIVE NEGATIVE   Influenza B by PCR NEGATIVE NEGATIVE   Resp Syncytial Virus by PCR NEGATIVE NEGATIVE    Imaging:  No results found.  MDM & MAU COURSE  MDM:  HIGH  R/O PTL vs MSK pain Cervical exam is  Long/Thick/High with no ctx on Toco and unremarkable exam no evidence of PTL at this time likely MSK pain - RX sent for Flexeril  Swabs obtained - wet prep c/w yeast ( RX Sent)  UA: Unremarkable   Back pain -  Tylenol/Flexeril  NST: Cat 1 reactive, with no  ctx pattern  Respiratory panel sent pending ( Low suspicion will call patient with results if TX needed)  GBS/ GC cx pending  I have reviewed the patient chart and performed the physical exam . I have ordered & interpreted the lab results and reviewed and interpreted the NST Medications ordered as stated below.  A/P as described below.  Counseling and education provided and patient agreeable  with plan as described below. Verbalized understanding.    MAU Course: Orders Placed This Encounter  Procedures   Wet prep, genital   Culture, beta strep (group b only)   Resp panel by RT-PCR (RSV, Flu A&B, Covid) Anterior Nasal Swab   Urinalysis, Routine w reflex microscopic -Urine, Clean Catch   Airborne and Contact precautions   Discharge patient Discharge disposition: 01-Home or Self Care; Discharge patient date: 02/26/2024   Meds ordered this encounter  Medications   cyclobenzaprine (FLEXERIL) tablet 10 mg   acetaminophen (TYLENOL) tablet 1,000 mg   cyclobenzaprine (FLEXERIL) 10 MG tablet    Sig: Take 1 tablet (10 mg total) by mouth 2 (two) times daily as needed for muscle spasms.    Dispense:  20 tablet    Refill:  0    Supervising Provider:   Reva Bores [[id number]]   acetaminophen (TYLENOL) 325 MG tablet    Sig: Take 2 tablets (650 mg total) by mouth every 6 (six) hours as needed for mild pain (pain score 1-3).    Dispense:  30 tablet    Refill:  0    Supervising Provider:   Reva Bores [[id number]]   terconazole (TERAZOL 3) 0.8 % vaginal cream    Sig: Place 1 applicator vaginally at bedtime.    Dispense:  20 g    Refill:  0    Supervising Provider:   Reva Bores [[id number]]    ASSESSMENT   1. False labor before 37 completed  weeks of gestation in third trimester   2. [redacted] weeks gestation of pregnancy   3. Bilateral low back pain without sciatica, unspecified chronicity   4. Nasal congestion   5. Diarrhea, unspecified type   6. Yeast infection involving the vagina and surrounding area     PLAN  Discharge home in stable condition with return precautions.    Future Appointments  Date Time Provider Department Center  02/27/2024 10:30 AM [facility]       Allergies as of 02/26/2024       Reactions   Fish Allergy Anaphylaxis        Medication List     STOP taking these medications    NIFEdipine 10 MG capsule Commonly known as: Procardia       TAKE these medications    acetaminophen 325 MG tablet Commonly known as: Tylenol Take 2 tablets (650 mg total) by mouth every 6 (six) hours as needed for mild pain (pain score 1-3).   bisacodyl 10 MG suppository Commonly known as: Dulcolax Place 1 suppository (10 mg total) rectally as needed for moderate constipation.   cyclobenzaprine 10 MG tablet Commonly known as: FLEXERIL Take 1 tablet (10 mg total) by mouth 2 (two) times daily as needed for muscle spasms.   ondansetron 4 MG disintegrating tablet Commonly known as: ZOFRAN-ODT Take 1 tablet (4 mg total) by mouth every 6 (six) hours as needed for nausea.   Prenatal Vitamin 27-0.8 MG Tabs Take 1 tablet by mouth daily.   terconazole 0.8 % vaginal cream Commonly known as: TERAZOL 3 Place  1 applicator vaginally at bedtime.        Marcell Barlow, MSN, [facility], Center for [employer]

## 2024-02-26 NOTE — MAU Note (Signed)
 Pain in back and pressure that is moving around, started around 2 am this morning. Denies and LOF, VB, baby moving well.

## 2024-02-26 NOTE — Discharge Instructions (Signed)

## 2024-02-26 NOTE — MAU Note (Signed)
.  Claudia Miller is a 33 y.o. at [redacted]w[redacted]d here in MAU reporting: constant lower back pain and pelvic pressure since waking up this morning.  Denies LOF, or vag bleeding. +FM Pt also reports about 4 episodes of diarrhea today.   Pain score: 10 Vitals:   02/26/24 1625  BP: 132/73  Pulse: 90  Resp: 16  Temp: 98.3 F (36.8 C)  SpO2: 100%   Lab orders placed from triage: ua

## 2024-02-27 ENCOUNTER — Ambulatory Visit: Payer: Medicaid Other | Attending: Obstetrics and Gynecology

## 2024-02-27 LAB — GC/CHLAMYDIA PROBE AMP (~~LOC~~) NOT AT ARMC
Chlamydia: NEGATIVE
Comment: NEGATIVE
Comment: NORMAL
Neisseria Gonorrhea: NEGATIVE

## 2024-02-28 LAB — CULTURE, BETA STREP (GROUP B ONLY)

## 2024-03-16 ENCOUNTER — Inpatient Hospital Stay (HOSPITAL_COMMUNITY)
Admission: AD | Admit: 2024-03-16 | Discharge: 2024-03-18 | DRG: 807 | Disposition: A | Discharge status: Home / Self Care | Attending: Obstetrics & Gynecology | Admitting: Obstetrics & Gynecology

## 2024-03-16 ENCOUNTER — Inpatient Hospital Stay (HOSPITAL_COMMUNITY): Admitting: Anesthesiology

## 2024-03-16 ENCOUNTER — Encounter (HOSPITAL_COMMUNITY): Payer: Self-pay | Admitting: Obstetrics & Gynecology

## 2024-03-16 DIAGNOSIS — Z833 Family history of diabetes mellitus: Secondary | ICD-10-CM | POA: Diagnosis not present

## 2024-03-16 DIAGNOSIS — Z87891 Personal history of nicotine dependence: Secondary | ICD-10-CM | POA: Diagnosis not present

## 2024-03-16 DIAGNOSIS — O34219 Maternal care for unspecified type scar from previous cesarean delivery: Secondary | ICD-10-CM | POA: Diagnosis present

## 2024-03-16 DIAGNOSIS — O99213 Obesity complicating pregnancy, third trimester: Secondary | ICD-10-CM | POA: Diagnosis present

## 2024-03-16 DIAGNOSIS — Z8249 Family history of ischemic heart disease and other diseases of the circulatory system: Secondary | ICD-10-CM

## 2024-03-16 DIAGNOSIS — O0933 Supervision of pregnancy with insufficient antenatal care, third trimester: Secondary | ICD-10-CM | POA: Diagnosis not present

## 2024-03-16 DIAGNOSIS — O26893 Other specified pregnancy related conditions, third trimester: Secondary | ICD-10-CM | POA: Diagnosis present

## 2024-03-16 DIAGNOSIS — Z23 Encounter for immunization: Secondary | ICD-10-CM

## 2024-03-16 DIAGNOSIS — Z3A39 39 weeks gestation of pregnancy: Secondary | ICD-10-CM | POA: Diagnosis not present

## 2024-03-16 DIAGNOSIS — O9902 Anemia complicating childbirth: Secondary | ICD-10-CM | POA: Diagnosis present

## 2024-03-16 DIAGNOSIS — O09899 Supervision of other high risk pregnancies, unspecified trimester: Secondary | ICD-10-CM

## 2024-03-16 DIAGNOSIS — Z560 Unemployment, unspecified: Secondary | ICD-10-CM

## 2024-03-16 DIAGNOSIS — O0990 Supervision of high risk pregnancy, unspecified, unspecified trimester: Secondary | ICD-10-CM

## 2024-03-16 DIAGNOSIS — O34211 Maternal care for low transverse scar from previous cesarean delivery: Secondary | ICD-10-CM | POA: Diagnosis not present

## 2024-03-16 DIAGNOSIS — Z98891 History of uterine scar from previous surgery: Secondary | ICD-10-CM

## 2024-03-16 DIAGNOSIS — O99214 Obesity complicating childbirth: Secondary | ICD-10-CM | POA: Diagnosis present

## 2024-03-16 LAB — CBC
HCT: 35.6 % — ABNORMAL LOW (ref 36.0–46.0)
Hemoglobin: 11.6 g/dL — ABNORMAL LOW (ref 12.0–15.0)
MCH: 29.5 pg (ref 26.0–34.0)
MCHC: 32.6 g/dL (ref 30.0–36.0)
MCV: 90.6 fL (ref 80.0–100.0)
Platelets: 226 10*3/uL (ref 150–400)
RBC: 3.93 MIL/uL (ref 3.87–5.11)
RDW: 13.8 % (ref 11.5–15.5)
WBC: 7.1 10*3/uL (ref 4.0–10.5)
nRBC: 0 % (ref 0.0–0.2)

## 2024-03-16 LAB — RPR: RPR Ser Ql: NONREACTIVE

## 2024-03-16 MED ORDER — OXYCODONE-ACETAMINOPHEN 5-325 MG PO TABS: 2.0000 | ORAL_TABLET | ORAL | Status: DC | PRN

## 2024-03-16 MED ORDER — LACTATED RINGERS IV SOLN: 500.0000 mL | Freq: Once | INTRAVENOUS | Status: DC

## 2024-03-16 MED ORDER — LIDOCAINE HCL (PF) 1 % IJ SOLN: 30.0000 mL | INTRAMUSCULAR | Status: DC | PRN

## 2024-03-16 MED ORDER — DIPHENHYDRAMINE HCL 50 MG/ML IJ SOLN: 12.5000 mg | INTRAMUSCULAR | Status: DC | PRN

## 2024-03-16 MED ORDER — OXYCODONE HCL 5 MG PO TABS: 10.0000 mg | ORAL_TABLET | Freq: Four times a day (QID) | ORAL | Status: DC | PRN

## 2024-03-16 MED ORDER — IBUPROFEN 800 MG PO TABS
800.0000 mg | ORAL_TABLET | Freq: Three times a day (TID) | ORAL | Status: DC
  Administered 2024-03-16 – 2024-03-18 (×7): 800 mg via ORAL
  Filled 2024-03-16 (×7): qty 1

## 2024-03-16 MED ORDER — SENNOSIDES-DOCUSATE SODIUM 8.6-50 MG PO TABS
2.0000 | ORAL_TABLET | Freq: Every day | ORAL | Status: DC
  Administered 2024-03-17 – 2024-03-18 (×2): 2 via ORAL
  Filled 2024-03-16 (×2): qty 2

## 2024-03-16 MED ORDER — PHENYLEPHRINE 80 MCG/ML (10ML) SYRINGE FOR IV PUSH (FOR BLOOD PRESSURE SUPPORT): 80.0000 ug | PREFILLED_SYRINGE | INTRAVENOUS | Status: DC | PRN

## 2024-03-16 MED ORDER — EPHEDRINE 5 MG/ML INJ: 10.0000 mg | INTRAVENOUS | Status: DC | PRN

## 2024-03-16 MED ORDER — ONDANSETRON HCL 4 MG/2ML IJ SOLN: 4.0000 mg | INTRAMUSCULAR | Status: DC | PRN

## 2024-03-16 MED ORDER — OXYCODONE-ACETAMINOPHEN 5-325 MG PO TABS: 1.0000 | ORAL_TABLET | ORAL | Status: DC | PRN

## 2024-03-16 MED ORDER — LIDOCAINE HCL (PF) 1 % IJ SOLN
INTRAMUSCULAR | Status: DC | PRN
  Administered 2024-03-16: 8 mL via EPIDURAL

## 2024-03-16 MED ORDER — ACETAMINOPHEN 500 MG PO TABS
1000.0000 mg | ORAL_TABLET | Freq: Three times a day (TID) | ORAL | Status: DC
  Administered 2024-03-16 – 2024-03-18 (×7): 1000 mg via ORAL
  Filled 2024-03-16 (×7): qty 2

## 2024-03-16 MED ORDER — MEDROXYPROGESTERONE ACETATE 150 MG/ML IM SUSP
150.0000 mg | INTRAMUSCULAR | Status: AC | PRN
  Administered 2024-03-17: 150 mg via INTRAMUSCULAR
  Filled 2024-03-16: qty 1

## 2024-03-16 MED ORDER — ZOLPIDEM TARTRATE 5 MG PO TABS: 5.0000 mg | ORAL_TABLET | Freq: Every evening | ORAL | Status: DC | PRN

## 2024-03-16 MED ORDER — DIPHENHYDRAMINE HCL 25 MG PO CAPS: 25.0000 mg | ORAL_CAPSULE | Freq: Four times a day (QID) | ORAL | Status: DC | PRN

## 2024-03-16 MED ORDER — SIMETHICONE 80 MG PO CHEW: 80.0000 mg | CHEWABLE_TABLET | ORAL | Status: DC | PRN

## 2024-03-16 MED ORDER — LACTATED RINGERS IV SOLN: INTRAVENOUS | Status: DC

## 2024-03-16 MED ORDER — FENTANYL-BUPIVACAINE-NACL 0.5-0.125-0.9 MG/250ML-% EP SOLN: 12.0000 mL/h | EPIDURAL | Status: DC | PRN

## 2024-03-16 MED ORDER — DIBUCAINE (PERIANAL) 1 % EX OINT: 1.0000 | TOPICAL_OINTMENT | CUTANEOUS | Status: DC | PRN

## 2024-03-16 MED ORDER — OXYTOCIN BOLUS FROM INFUSION
333.0000 mL | Freq: Once | INTRAVENOUS | Status: AC
  Administered 2024-03-16: 333 mL via INTRAVENOUS

## 2024-03-16 MED ORDER — FENTANYL-BUPIVACAINE-NACL 0.5-0.125-0.9 MG/250ML-% EP SOLN
12.0000 mL/h | EPIDURAL | Status: DC | PRN
  Administered 2024-03-16: 12 mL/h via EPIDURAL
  Filled 2024-03-16: qty 250

## 2024-03-16 MED ORDER — BENZOCAINE-MENTHOL 20-0.5 % EX AERO: 1.0000 | INHALATION_SPRAY | CUTANEOUS | Status: DC | PRN

## 2024-03-16 MED ORDER — HYDROXYZINE HCL 50 MG PO TABS: 50.0000 mg | ORAL_TABLET | Freq: Four times a day (QID) | ORAL | Status: DC | PRN

## 2024-03-16 MED ORDER — ONDANSETRON HCL 4 MG PO TABS: 4.0000 mg | ORAL_TABLET | ORAL | Status: DC | PRN

## 2024-03-16 MED ORDER — COCONUT OIL OIL: 1.0000 | TOPICAL_OIL | Status: DC | PRN

## 2024-03-16 MED ORDER — SOD CITRATE-CITRIC ACID 500-334 MG/5ML PO SOLN: 30.0000 mL | ORAL | Status: DC | PRN

## 2024-03-16 MED ORDER — OXYTOCIN-SODIUM CHLORIDE 30-0.9 UT/500ML-% IV SOLN
2.5000 [IU]/h | INTRAVENOUS | Status: DC
  Filled 2024-03-16: qty 500

## 2024-03-16 MED ORDER — ONDANSETRON HCL 4 MG/2ML IJ SOLN: 4.0000 mg | Freq: Four times a day (QID) | INTRAMUSCULAR | Status: DC | PRN

## 2024-03-16 MED ORDER — WITCH HAZEL-GLYCERIN EX PADS: 1.0000 | MEDICATED_PAD | CUTANEOUS | Status: DC | PRN

## 2024-03-16 MED ORDER — OXYCODONE HCL 5 MG PO TABS: 5.0000 mg | ORAL_TABLET | Freq: Four times a day (QID) | ORAL | Status: DC | PRN

## 2024-03-16 MED ORDER — LACTATED RINGERS IV SOLN: 500.0000 mL | INTRAVENOUS | Status: DC | PRN

## 2024-03-16 MED ORDER — ACETAMINOPHEN 325 MG PO TABS: 650.0000 mg | ORAL_TABLET | ORAL | Status: DC | PRN

## 2024-03-16 MED ORDER — FENTANYL CITRATE (PF) 100 MCG/2ML IJ SOLN
50.0000 ug | INTRAMUSCULAR | Status: DC | PRN
  Administered 2024-03-16: 100 ug via INTRAVENOUS
  Filled 2024-03-16: qty 2

## 2024-03-16 NOTE — Progress Notes (Signed)
 33 y.o. yo [id number]  with undesired fertility,status post vaginal delivery, desires permanent sterilization. Risks and benefits of procedure discussed with patient including permanence of method, bleeding, infection, injury to surrounding organs and need for additional procedures. Risk failure of 0.5-1% with increased risk of ectopic gestation if pregnancy occurs was also discussed with patient. Reviewed other contraception options including LARCs. Patient desires to proceed with bilateral salpingectomy. Patient has just received her breakfast tray and desires to delay the procedure until tomorrow. Patient has been scheduled at 11 am on 03/17/24 Consent has been signed

## 2024-03-16 NOTE — Anesthesia Postprocedure Evaluation (Signed)
 Anesthesia Post Note  Patient: Claudia Miller  Procedure(s) Performed: AN AD HOC LABOR EPIDURAL     Patient location during evaluation: Mother Baby Anesthesia Type: Epidural Level of consciousness: awake Pain management: satisfactory to patient Vital Signs Assessment: post-procedure vital signs reviewed and stable Respiratory status: spontaneous breathing Cardiovascular status: stable Anesthetic complications: no  No notable events documented.  Last Vitals:  Vitals:   03/16/24 0550 03/16/24 0650  BP: 111/77 (!) 109/50  Pulse: 75 85  Resp: 18 17  Temp: 36.9 C 37.2 C  SpO2: 97% 99%    Last Pain:  Vitals:   03/16/24 0729  TempSrc:   PainSc: 0-No pain   Pain Goal:                   [employer]

## 2024-03-16 NOTE — H&P (Signed)
 LABOR AND DELIVERY ADMISSION HISTORY AND PHYSICAL NOTE  MARJON DOXTATER is a 33 y.o. female [phone] with IUP at [redacted]w[redacted]d presenting for SOL.   Patient reports the fetal movement as active. Patient reports uterine contraction activity as regular, every 4 minutes. Patient reports  vaginal bleeding as none. Patient describes fluid per vagina as None.   Patient denies headache, vision changes, chest pain, shortness of breath, right upper quadrant pain, or LE edema.  She plans on bottle feeding. Her contraception plan is: bilateral tubal ligation.  Prenatal History/Complications: PNC at [facility] - Late onset to care [redacted]w[redacted]d - Limited PNC (no care 24->31 weeks, 31->38 weeks) - Hx C/S, VBAC - Obesity  Sono:  @[redacted]w[redacted]d , CWD, normal anatomy, cephalic presentation, anterior placenta, 65%ile  Pregnancy complications:  Patient Active Problem List   Diagnosis Date Noted   Normal labor 03/16/2024   History of VBAC 12/12/2023   History of preterm delivery, currently pregnant 12/06/2023   Obesity affecting pregnancy in third trimester, antepartum 05/27/2020   Supervision of high risk pregnancy, antepartum 05/20/2020   History of cesarean delivery, currently pregnant 01/23/2019    Past Medical History: 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 04/15/2020   Supervision of normal pregnancy, antepartum 07/12/2018    Nursing Staff Provider Office Location  [facility] Dating   [place] 6.4 on 06/04/18 Language  English  Anatomy US  Normal Flu Vaccine   08/15/18 Genetic Screen  NIPS: normal  TDaP vaccine   11/30/18 Hgb A1C or  GTT Early  Third trimester nl 2 hour  Rhogam   n/a   LAB RESULTS  Feeding Plan Bottle Blood Type A/Positive/-- (07/17 1211)  Contraception Depo Antibody Negative (07/17 1211) Circumcision  girl  Rubell   Trichomoniasis 08/09/2018   [x]  TOC    Past Surgical History: Past Surgical History:  Procedure  Laterality Date   CESAREAN SECTION N/A 01/23/2019   Procedure: CESAREAN SECTION;  Surgeon: Tereso Newcomer, MD;  [facility];  Service: Obstetrics;  Laterality: N/A;    Obstetrical History: OB History     Gravida  5   Para  4   Term  2   Preterm  2   AB  0   Living  4      SAB  0   IAB  0   Ectopic  0   Multiple  0   Live Births  4           Social History: Social History   Socioeconomic History   Marital status: Single    Spouse name: Not on file   Number of children: 3   Years of education: Not on file   Highest education level: Not on file  Occupational History   Occupation: unemployed  Tobacco Use   Smoking status: Former    Types: Cigars   Smokeless tobacco: Never  Vaping Use   Vaping status: Never Used  Substance and Sexual Activity   Alcohol use: No   Drug use: Not Currently    Types: Marijuana   Sexual activity: Not Currently    Birth control/protection: None  Other Topics Concern   Not on file  Social History Narrative   Not on file   Social Drivers of Health   Financial Resource Strain: Not on file  Food Insecurity: Patient Declined (02/26/2024)   Hunger Vital Sign  Worried About Programme researcher, broadcasting/film/video in the Last Year: Patient declined    Barista in the Last Year: Patient declined  Transportation Needs: No Transportation Needs (12/26/2023)   PRAPARE - Administrator, Civil Service (Medical): No    Lack of Transportation (Non-Medical): No  Physical Activity: Not on file  Stress: Not on file  Social Connections: Moderately Isolated (12/26/2023)   Social Connection and Isolation Panel [NHANES]    Frequency of Communication with Friends and Family: More than three times a week    Frequency of Social Gatherings with Friends and Family: More than three times a week    Attends Religious Services: 1 to 4 times per year    Active Member of [employer] or Organizations: No    Attends Hospital doctor: Not on file    Marital Status: Never married    Family History: Family History  Problem Relation Age of Onset   Heart disease Mother    Asthma Mother    Diabetes Mother    Hypertension Mother    Cancer Father    Hypertension Father    Asthma Sister    Hypertension Sister    Asthma Brother    Hypertension Brother     Allergies: Allergies  Allergen Reactions   Fish Allergy Anaphylaxis    Medications Prior to Admission  Medication Sig Dispense Refill Last Dose/Taking   acetaminophen (TYLENOL) 325 MG tablet Take 2 tablets (650 mg total) by mouth every 6 (six) hours as needed for mild pain (pain score 1-3). 30 tablet 0    bisacodyl (DULCOLAX) 10 MG suppository Place 1 suppository (10 mg total) rectally as needed for moderate constipation. 12 suppository 0    cyclobenzaprine (FLEXERIL) 10 MG tablet Take 1 tablet (10 mg total) by mouth 2 (two) times daily as needed for muscle spasms. 20 tablet 0    ondansetron (ZOFRAN-ODT) 4 MG disintegrating tablet Take 1 tablet (4 mg total) by mouth every 6 (six) hours as needed for nausea. 20 tablet 0    Prenatal Vit-Fe Fumarate-FA (PRENATAL VITAMIN) 27-0.8 MG TABS Take 1 tablet by mouth daily. 30 tablet 12    terconazole (TERAZOL 3) 0.8 % vaginal cream Place 1 applicator vaginally at bedtime. 20 g 0      Review of Systems  All systems reviewed and negative except as stated in HPI  Physical Exam BP 132/69   Pulse 95   Temp 98.6 F (37 C) (Oral)   Resp 18   LMP 06/26/2023 (Within Weeks)   SpO2 100%   Physical Exam Constitutional:      General: She is not in acute distress.    Appearance: She is not ill-appearing.  Cardiovascular:     Rate and Rhythm: Normal rate.  Pulmonary:     Effort: Pulmonary effort is normal.  Abdominal:     Comments: Gravid  Musculoskeletal:        General: No swelling.  Skin:    General: Skin is warm and dry.  Neurological:     General: No focal deficit present.  Psychiatric:        Mood  and Affect: Mood normal.  Presentation: cephalic by check  Fetal monitoring: Baseline: 145 bpm, Variability: Good {> 6 bpm), Accelerations: Reactive, and Decelerations: Absent Uterine activity: 2-4  Dilation: 6 Effacement (%): 90 Cervical Position: Anterior Station: -2 Presentation: Vertex Exam by:: Joline Salt, RN  Prenatal labs: ABO, Rh: --/--/PENDING (03/21 0110) Antibody: PENDING (03/21 0110) Rubella:  Immune (11/25 0000) RPR: Nonreactive (11/25 0000)  HBsAg:    HIV: Non-reactive (11/25 0000)  GC/Chlamydia:  Neisseria Gonorrhea  Date Value Ref Range Status  02/26/2024 Negative  Final   Chlamydia  Date Value Ref Range Status  02/26/2024 Negative  Final   GBS:   Negative  Prenatal Transfer Tool  Maternal Diabetes: No Genetic Screening: Normal Maternal Ultrasounds/Referrals: Normal Fetal Ultrasounds or other Referrals:  None Maternal Substance Abuse:  No Significant Maternal Medications:  None Significant Maternal Lab Results: Group B Strep negative  Results for orders placed or performed during the hospital encounter of 03/16/24 (from the past 24 hours)  Type and screen [facility]   Collection Time: 03/16/24  1:10 AM  Result Value Ref Range   ABO/RH(D) PENDING    Antibody Screen PENDING    Sample Expiration      03/19/2024,2359 Performed at [facility], [address] [place], [place] [address]     Assessment: KEMIYA BATDORF is a 33 y.o. [phone] at [redacted]w[redacted]d here for SOL. Hx SVD x2, C/S, VBAC x1.  #Labor: Expectant management. AROM PRN #Pain: IV pain meds PRN, epidural upon request #FHT: Category I #GBS/ID: Negative #MOF: bottle feeding #MOC:  BTL  Joanne Gavel, MD [facility] Fellow Center for [facility], [facility]  03/16/2024, 1:42 AM

## 2024-03-16 NOTE — Anesthesia Procedure Notes (Signed)
 Epidural Patient location during procedure: OB Start time: 03/16/2024 2:30 AM End time: 03/16/2024 2:35 AM  Staffing Anesthesiologist: Bethena Midget, MD  Preanesthetic Checklist Completed: patient identified, IV checked, site marked, risks and benefits discussed, surgical consent, monitors and equipment checked, pre-op evaluation and timeout performed  Epidural Patient position: sitting Prep: DuraPrep and site prepped and draped Patient monitoring: continuous pulse ox and blood pressure Approach: midline Location: L3-L4 Injection technique: LOR air  Needle:  Needle type: Tuohy  Needle gauge: 17 G Needle length: 9 cm and 9 Needle insertion depth: 7 cm Catheter type: closed end flexible Catheter size: 19 Gauge Catheter at skin depth: 13 cm Test dose: negative  Assessment Events: blood not aspirated, no cerebrospinal fluid, injection not painful, no injection resistance, no paresthesia and negative IV test

## 2024-03-16 NOTE — Anesthesia Preprocedure Evaluation (Addendum)
 Anesthesia Evaluation  Patient identified by MRN, date of birth, ID band Patient awake    Reviewed: Allergy & Precautions, H&P , NPO status , Patient's Chart, lab work & pertinent test results, reviewed documented beta blocker date and time   Airway Mallampati: II  TM Distance: >3 FB Neck ROM: full    Dental no notable dental hx.    Pulmonary former smoker   Pulmonary exam normal breath sounds clear to auscultation       Cardiovascular negative cardio ROS Normal cardiovascular exam Rhythm:regular Rate:Normal     Neuro/Psych negative neurological ROS  negative psych ROS   GI/Hepatic negative GI ROS, Neg liver ROS,,,  Endo/Other  negative endocrine ROS    Renal/GU negative Renal ROS  negative genitourinary   Musculoskeletal   Abdominal   Peds  Hematology negative hematology ROS (+) Blood dyscrasia, anemia   Anesthesia Other Findings   Reproductive/Obstetrics (+) Pregnancy                              Anesthesia Physical Anesthesia Plan  ASA: 3  Anesthesia Plan: Epidural   Post-op Pain Management: Minimal or no pain anticipated   Induction: Intravenous  PONV Risk Score and Plan: 2 and Treatment may vary due to age or medical condition  Airway Management Planned: Natural Airway and Simple Face Mask  Additional Equipment:   Intra-op Plan:   Post-operative Plan:   Informed Consent: I have reviewed the patients History and Physical, chart, labs and discussed the procedure including the risks, benefits and alternatives for the proposed anesthesia with the patient or authorized representative who has indicated his/her understanding and acceptance.       Plan Discussed with: Anesthesiologist and CRNA  Anesthesia Plan Comments:         Anesthesia Quick Evaluation

## 2024-03-16 NOTE — Discharge Summary (Signed)
 Postpartum Discharge Summary  Date of Service updated***     Patient Name: Claudia Miller DOB: [birth year] MRN: [id number]  Date of admission: 03/16/2024 Delivery date:03/16/2024 Delivering provider: Joanne Gavel Date of discharge: 03/17/2024  Admitting diagnosis: Normal labor [O80, Z37.9] Intrauterine pregnancy: [redacted]w[redacted]d     Secondary diagnosis:  Principal Problem:   VBAC, delivered Active Problems:   History of cesarean delivery, currently pregnant   Supervision of high risk pregnancy, antepartum   Obesity affecting pregnancy in third trimester, antepartum   History of preterm delivery, currently pregnant   History of VBAC  Additional problems: none    Discharge diagnosis: Term Pregnancy Delivered and VBAC                                              Post partum procedures: Depo prior to d/c Augmentation: N/A Complications: None  Hospital course: Onset of Labor With Vaginal Delivery      33 y.o. yo [id number] at [redacted]w[redacted]d was admitted in Active Labor on 03/16/2024. Labor course was complicated by none.  Membrane Rupture Time/Date: 2:50 AM,03/16/2024  Delivery Method:Vaginal, Spontaneous Operative Delivery:N/A Episiotomy: None Lacerations:  None Patient had an uncomplicated postpartum course.  [name], tolerating a regular diet, passing flatus, and urinating well. Patient is discharged home in stable condition on 03/17/24.  Newborn Data: Birth date:03/16/2024 Birth time:3:28 AM Gender:Female Living status:Living Apgars:8 ,9  Weight:3220 g (7lb 1.6oz)  Magnesium Sulfate received: No BMZ received: No Rhophylac:No MMR:No T-DaP:Given postpartum Flu: No RSV Vaccine received: No Transfusion:No  Immunizations received: Immunization History  Administered Date(s) Administered   Influenza,inj,Quad PF,6+ Mos 08/15/2018, 10/07/2020   Influenza-Unspecified 09/19/2016   Tdap 11/30/2018, 11/14/2020, 03/17/2024    Physical exam  Vitals:   03/16/24 1529 03/16/24  1955 03/17/24 1430 03/17/24 2045  BP: (!) 108/51 (!) 114/57 118/60 120/60  Pulse: 76 81 78 77  Resp: 20 18 16 16   Temp: 97.8 F (36.6 C) 98.5 F (36.9 C) 98.6 F (37 C) 98.7 F (37.1 C)  TempSrc: Oral Oral Oral Oral  SpO2:  100% 99% 99%   General: alert and cooperative Lochia: appropriate Uterine Fundus: firm Incision: N/A DVT Evaluation: No evidence of DVT seen on physical exam. Labs: Lab Results  Component Value Date   WBC 7.1 03/16/2024   HGB 11.6 (L) 03/16/2024   HCT 35.6 (L) 03/16/2024   MCV 90.6 03/16/2024   PLT 226 03/16/2024      Latest Ref Rng & Units 08/17/2023   10:12 PM  CMP  Glucose 70 - 99 mg/dL 79   BUN 6 - 20 mg/dL 9   Creatinine 1.47 - 8.29 mg/dL 5.62   Sodium 130 - 865 mmol/L 133   Potassium 3.5 - 5.1 mmol/L 4.0   Chloride 98 - 111 mmol/L 104   CO2 22 - 32 mmol/L 20   Calcium 8.9 - 10.3 mg/dL 8.9   Total Protein 6.5 - 8.1 g/dL 7.8   Total Bilirubin 0.3 - 1.2 mg/dL 0.4   Alkaline Phos 38 - 126 U/L 44   AST 15 - 41 U/L 16   ALT 0 - 44 U/L 15    Edinburgh Score:    03/16/2024   11:24 AM  Edinburgh Postnatal Depression Scale Screening Tool  I have been able to laugh and see the funny side of things. 0  I have  looked forward with enjoyment to things. 0  I have blamed myself unnecessarily when things went wrong. 2  I have been anxious or worried for no good reason. 0  I have felt scared or panicky for no good reason. 0  Things have been getting on top of me. 0  I have been so unhappy that I have had difficulty sleeping. 0  I have felt sad or miserable. 0  I have been so unhappy that I have been crying. 0  The thought of harming myself has occurred to me. 0  Edinburgh Postnatal Depression Scale Total 2   Edinburgh Postnatal Depression Scale Total: 2   After visit meds:  Allergies as of 03/17/2024       Reactions   Fish Allergy Anaphylaxis     Med Rec must be completed prior to using this [facility]***        Discharge home in  stable condition Infant Feeding: {Baby feeding:23562} Infant Disposition:{CHL IP OB HOME WITH EXBMWU:13244} Discharge instruction: per After Visit Summary and Postpartum booklet. Activity: Advance as tolerated. Pelvic rest for 6 weeks.  Diet: routine diet Future Appointments: Future Appointments  Date Time Provider Department Center  05/01/2024  1:35 PM Sissy Hoff [facility]   Follow up Visit:  Message sent to [facility] 3/21  Please schedule this patient for a In person postpartum visit in 6 weeks with the following provider: Any provider. Additional Postpartum F/U: None   High risk pregnancy complicated by:  obesity, hx C/S, hx VBAC Delivery mode:  Vaginal, Spontaneous Anticipated Birth Control:   BTL PP vs interval ?   03/17/2024 Arabella Merles, CNM

## 2024-03-16 NOTE — MAU Note (Signed)
 Claudia Miller is a 33 y.o. at [redacted]w[redacted]d here in MAU reporting: by EMS. For ctx that started around 2300. Pt states they went from every 10 minutes to every 4 minutes. Pt denies LOF or VB. +FM   Onset of complaint: 2300 Pain score: 10/10 Vitals:   03/16/24 0102 03/16/24 0103  BP:  132/69  Pulse:  95  Resp:  18  Temp:  98.6 F (37 C)  SpO2: 100%      FHT:148 Lab orders placed from triage:

## 2024-03-17 ENCOUNTER — Encounter (HOSPITAL_COMMUNITY)
Admission: AD | Disposition: A | Discharge status: Home / Self Care | Payer: Self-pay | Source: Home / Self Care | Attending: Obstetrics & Gynecology

## 2024-03-17 SURGERY — LIGATION, FALLOPIAN TUBE, POSTPARTUM
Anesthesia: Choice | Laterality: Bilateral

## 2024-03-17 MED ORDER — TETANUS-DIPHTH-ACELL PERTUSSIS 5-2.5-18.5 LF-MCG/0.5 IM SUSY
0.5000 mL | PREFILLED_SYRINGE | Freq: Once | INTRAMUSCULAR | Status: AC
  Administered 2024-03-17: 0.5 mL via INTRAMUSCULAR

## 2024-03-17 NOTE — Clinical Social Work Maternal (Signed)
 CLINICAL SOCIAL WORK MATERNAL/CHILD NOTE  Patient Details  Name: JAYDALYN DEMATTIA MRN: [id number] Date of Birth: 1991-12-04  Date:  03/17/2024  Clinical Social Worker Initiating Note:  Albertine Patricia, [place] Date/Time: Initiated:  03/17/24/1749     Child's Name:  Lacretia Nicks   Biological Parents:  Mother   Need for Interpreter:  None   Reason for Referral:  Current Substance Use/Substance Use During Pregnancy  , Late or No Prenatal Care     Address:  [address] [place] [address]    Phone number:  [phone] (home)     Additional phone number:   Household Members/Support Persons (HM/SP):   Household Member/Support Person 1, Household Member/Support Person 2, Household Member/Support Person 3, Household Member/Support Person 4   HM/SP Name Relationship DOB or Age  HM/SP -1 Carina Chaplin 07/27/2014  HM/SP -2 Layliana Devins Daughter 01/23/2019  HM/SP -3 Ramiah Helfrich Daughter 03/10/2011  HM/SP -4 Eli Phillips Suppes Daughter 11/13/2020  HM/SP -5        HM/SP -6        HM/SP -7        HM/SP -8          Natural Supports (not living in the home):  Friends, Immediate Family   Professional Supports: None   Employment: Unemployed   Type of Work:     Education:  Counsellor arranged:    Surveyor, quantity Resources:  Medicaid   Other Resources:      Cultural/Religious Considerations Which May Impact Care:    Strengths:  Ability to meet basic needs  , Home prepared for child  , Pediatrician chosen   Psychotropic Medications:         Pediatrician:    Armed forces operational officer area  Pediatrician List:   Management consultant ([facility])  [place]    [place]      Pediatrician Fax Number:    Risk Factors/Current Problems:  Substance Use     Cognitive State:  Linear Thinking  , Able to Concentrate  , Alert  , Goal Oriented     Mood/Affect:  Calm  , Comfortable  , Relaxed  ,  Interested     CSW Assessment: CSW was consulted due to limited prenatal care and marijuana use during pregnancy. CSW met with MOB at bedside to complete assessment. When CSW entered room, MOB was observed laying in hospital bed. Infant was asleep on his back in bassinet. CSW introduced self and explained reason for consult. MOB presented as calm, was agreeable to consult and remained engaged throughout encounter.   MOB confirmed demographic information in chart. CSW inquired about MOB's mental health history. MOB denied a history of mental health symptoms/diagnoses and denied endorsing postpartum depression symptoms after the birth of her other children. CSW inquired about supports. MOB identified family and friends as supports. MOB denied current SI/HI/DV.  CSW provided education regarding the baby blues period vs. perinatal mood disorders, discussed treatment and gave resources for mental health follow up if concerns arise.  CSW recommends self-evaluation during the postpartum time period using the New Mom Checklist from Postpartum Progress and encouraged MOB to contact a medical professional if symptoms are noted at any time.    MOB reports she has all needed items for infant, including a car seat and bassinet. MOB reports she receives food stamps and plans to sign up for [facility] benefits.  CSW offered to place [facility] referral, which MOB expressed interest in. CSW placed [facility] referral. CSW inquired about transportation barriers, MOB denied issues with transportation. CSW inquired about additional resource needs. MOB denied additional resource needs.  CSW informed MOB about hospital drug screen policy due to reported marijuana use and limited prenatal care during pregnancy. CSW explained that infant's UDS and CDS would be monitored and a CPS report would be made if warranted. MOB expressed understanding. CSW inquired about substance use/barriers to prenatal care during pregnancy. MOB reported that she had  limited prenatal care due to having issues with her Medicaid coverage. MOB reported that she smoked marijuana in the beginning of her pregnancy to help her have an appetite and eat. CSW inquired when MOB last used marijuana. MOB reported "it's been quite some time" and was unable to provide an exact time. MOB denied using other illicit substances during pregnancy. MOB denied a history of prior CPS involvement.   CSW provided review of Sudden Infant Death Syndrome (SIDS) precautions.    CSW identifies no further need for intervention and no barriers to discharge at this time.  CSW Plan/Description:  No Further Intervention Required/No Barriers to Discharge, Sudden Infant Death Syndrome (SIDS) Education, Perinatal Mood and Anxiety Disorder (PMADs) Education, Hospital Drug Screen Policy Information, Other Information/Referral to [facility], CSW Will Continue to Monitor Umbilical Cord Tissue Drug Screen Results and Make Report if Reggie Pile, LCSWA 03/17/2024, 5:59 PM

## 2024-03-17 NOTE — Progress Notes (Signed)
 Post Partum Day 1 Subjective: No complaints, up ad lib, voiding, tolerating PO, and + flatus. Baby stable at bedside, breastfeeding. Moderate lochia reported. Minimal pain reported.  Objective: Blood pressure (!) 114/57, pulse 81, temperature 98.5 F (36.9 C), temperature source Oral, resp. rate 18, last menstrual period 06/26/2023, SpO2 100%, unknown if currently breastfeeding.  Physical Exam:  General: alert and no distress Lochia: appropriate Uterine Fundus: firm, NT DVT Evaluation: No evidence of DVT seen on physical exam. Negative Homan's sign. No cords or calf tenderness.No significant calf/ankle edema.  Recent Labs    03/16/24 0110  HGB 11.6*  HCT 35.6*    Assessment/Plan: Breastfeeding, has Lactation consult Patient was scheduled for PPBTL today at 1100.  Details of postpartum tubal sterilization discussed in detail.   She was told that this will be performed as either salpingectomy or occlusion with Filshie clips, depending on difficulty of procedure, exposure and other factors.  Risks of procedure discussed with patient including but not limited to: risk of regret, permanence of method, bleeding, infection, injury to surrounding organs and need for additional procedures.  Failure risk of about 1-2% with increased risk of ectopic gestation if pregnancy occurs was also discussed with patient.   Also discussed possibility of post-tubal syndrome with increased pelvic pain or menstrual irregularities. Patient verbalized understanding of these risks and does not want to proceed with sterilization today.  Other reversible forms of contraception including the most effective LARCs such as IUD or Nexplanon were discussed with patient; she declines these. Also discussed vasectomy for her FOB, counseled about this procedure being less invasive and more effective.  Patient was also given the option of an interval laparoscopic tubal ligation or bilateral salpingectomy which slightly increases the  efficacy and is less invasive but she declined.  She desires Depo Provera for now, this was ordered.  She was told she can change her mind anytime. OR notified and case was cancelled.  Routine postpartum care. Likely discharge to home tomorrow.   LOS: 1 day   Jaynie Collins, MD 03/17/2024, 6:55 AM

## 2024-03-17 NOTE — Plan of Care (Signed)
 Progressing towards discharge. TDAP given, Depo Provera given and instructions for birth control given for Depo. Saline lock removed. Clean dry and intact.

## 2024-03-18 MED ORDER — IBUPROFEN 800 MG PO TABS: 800.0000 mg | ORAL_TABLET | Freq: Three times a day (TID) | ORAL | 0 refills | Status: AC | PRN

## 2024-03-20 ENCOUNTER — Ambulatory Visit

## 2024-03-20 LAB — TYPE AND SCREEN
ABO/RH(D): A POS
Antibody Screen: POSITIVE
Unit division: 0
Unit division: 0

## 2024-03-20 LAB — BPAM RBC
Blood Product Expiration Date: 202504082359
Blood Product Expiration Date: 202504082359
ISSUE DATE / TIME: 202503141714
ISSUE DATE / TIME: 202503171221
Unit Type and Rh: 6200
Unit Type and Rh: 6200

## 2024-03-29 ENCOUNTER — Telehealth (HOSPITAL_COMMUNITY): Payer: Self-pay

## 2024-03-29 NOTE — Telephone Encounter (Signed)
 03/29/2024 1107  Name: JANIYA MILLIRONS MRN: [id number] DOB: 08-20-1991  Reason for Call:  Transition of Care Hospital Discharge Call  Contact Status: Patient Contact Status: Message  Language assistant needed:          Follow-Up Questions:    Inocente Salles Postnatal Depression Scale:  In the Past 7 Days:    PHQ2-9 Depression Scale:     Discharge Follow-up:    Post-discharge interventions: NA  Signature  Signe Colt

## 2024-05-01 ENCOUNTER — Ambulatory Visit: Admitting: Certified Nurse Midwife

## 2024-05-01 DIAGNOSIS — O34219 Maternal care for unspecified type scar from previous cesarean delivery: Secondary | ICD-10-CM

## 2025-01-03 ENCOUNTER — Ambulatory Visit (HOSPITAL_COMMUNITY)
Admission: EM | Admit: 2025-01-03 | Discharge: 2025-01-03 | Disposition: A | Discharge status: Home / Self Care | Attending: Emergency Medicine | Admitting: Emergency Medicine

## 2025-01-03 ENCOUNTER — Encounter (HOSPITAL_COMMUNITY): Payer: Self-pay

## 2025-01-03 DIAGNOSIS — L239 Allergic contact dermatitis, unspecified cause: Secondary | ICD-10-CM | POA: Diagnosis not present

## 2025-01-03 MED ORDER — CETIRIZINE HCL 10 MG PO TABS: 10.0000 mg | ORAL_TABLET | Freq: Every day | ORAL | 0 refills | Status: AC

## 2025-01-03 MED ORDER — PREDNISONE 20 MG PO TABS: 40.0000 mg | ORAL_TABLET | Freq: Every day | ORAL | 0 refills | Status: AC

## 2025-01-03 MED ORDER — DEXAMETHASONE SOD PHOSPHATE PF 10 MG/ML IJ SOLN
10.0000 mg | Freq: Once | INTRAMUSCULAR | Status: AC
  Administered 2025-01-03: 10 mg via INTRAMUSCULAR

## 2025-01-03 NOTE — ED Provider Notes (Signed)
 " [facility]    CSN: [id number] Arrival date & time: 01/03/25  0854      History   Chief Complaint Chief Complaint  Patient presents with   Rash    HPI Claudia Miller is a 34 y.o. female.   Patient presents with rash to her forearms, neck, and thighs that began approximately 3 days ago.  Patient reports that she feels like her arms are little swollen as well.  Patient states that the rash developed shortly after taking a lavender bath.  Patient states that she has taken a lavender bath in the past without any reaction.  Patient denies any difficulty breathing.  Patient also denies any rash to her torso or face.  Patient reports that she has been taking Benadryl  without relief.  Patient reports last dose of Benadryl  was yesterday.  The history is provided by the patient and medical records.  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 04/15/2020   Supervision of normal pregnancy, antepartum 07/12/2018    Nursing Staff Provider Office Location  [facility] Dating   US  6.4 on 06/04/18 Language  English  Anatomy US   Normal Flu Vaccine   08/15/18 Genetic Screen  NIPS: normal  TDaP vaccine   11/30/18 Hgb A1C or  GTT Early  Third trimester nl 2 hour  Rhogam   n/a   LAB RESULTS  Feeding Plan Bottle Blood Type A/Positive/-- (07/17 1211)  Contraception Depo Antibody Negative (07/17 1211) Circumcision  girl  Rubell   Trichomoniasis 08/09/2018   [x]  TOC    Patient Active Problem List   Diagnosis Date Noted   VBAC, delivered 03/16/2024   History of VBAC 12/12/2023   History of preterm delivery, currently pregnant 12/06/2023   Obesity affecting pregnancy in third trimester, antepartum 05/27/2020   Supervision of high risk pregnancy, antepartum 05/20/2020   History of cesarean delivery, currently pregnant 01/23/2019    Past Surgical History:  Procedure Laterality Date   CESAREAN SECTION N/A  01/23/2019   Procedure: CESAREAN SECTION;  Surgeon: Herchel Gloris LABOR, MD;  Location: [facility];  Service: Obstetrics;  Laterality: N/A;    OB History     Gravida  5   Para  5   Term  3   Preterm  2   AB  0   Living  5      SAB  0   IAB  0   Ectopic  0   Multiple  0   Live Births  5            Home Medications    Prior to Admission medications  Medication Sig Start Date End Date Taking? Authorizing Provider  cetirizine  (ZYRTEC  ALLERGY) 10 MG tablet Take 1 tablet (10 mg total) by mouth daily. 01/03/25  Yes [name], [name], NP  predniSONE  (DELTASONE ) 20 MG tablet Take 2 tablets (40 mg total) by mouth daily for 5 days. 01/03/25 01/08/25 Yes [name], [name], NP  ibuprofen  (ADVIL ) 800 MG tablet Take 1 tablet (800 mg total) by mouth every 8 (eight) hours as needed. 03/18/24   [name], CNM  Prenatal Vit-Fe Fumarate-FA (PRENATAL VITAMIN) 27-0.8 MG TABS Take 1 tablet by mouth daily. 08/18/23   [name], CNM    Family History Family History  Problem Relation Age of Onset   Heart disease Mother    Asthma  Mother    Diabetes Mother    Hypertension Mother    Cancer Father    Hypertension Father    Asthma Sister    Hypertension Sister    Asthma Brother    Hypertension Brother     Social History Social History[1]   Allergies   Fish allergy   Review of Systems Review of Systems  Skin:  Positive for rash.   Per HPI  Physical Exam Triage Vital Signs ED Triage Vitals [01/03/25 0906]  Encounter Vitals Group     BP 100/69     Girls Systolic BP Percentile      Girls Diastolic BP Percentile      Boys Systolic BP Percentile      Boys Diastolic BP Percentile      Pulse Rate 73     Resp 16     Temp 98.3 F (36.8 C)     Temp Source Oral     SpO2 97 %     Weight      Height      Head Circumference      Peak Flow      Pain Score      Pain Loc      Pain Education      Exclude from Growth Chart    No data found.  Updated  Vital Signs BP 100/69 (BP Location: Left Arm)   Pulse 73   Temp 98.3 F (36.8 C) (Oral)   Resp 16   LMP 12/26/2024 (Approximate)   SpO2 97%   Visual Acuity Right Eye Distance:   Left Eye Distance:   Bilateral Distance:    Right Eye Near:   Left Eye Near:    Bilateral Near:     Physical Exam Vitals and nursing note reviewed.  Constitutional:      General: She is awake. She is not in acute distress.    Appearance: Normal appearance. She is well-developed and well-groomed. She is not ill-appearing.  Cardiovascular:     Rate and Rhythm: Normal rate and regular rhythm.  Pulmonary:     Effort: Pulmonary effort is normal.     Breath sounds: Normal breath sounds.  Skin:    General: Skin is warm and dry.     Findings: Rash present. Rash is macular.     Comments: Diffuse macular rash noted to bilateral forearms, neck, and thighs.  Neurological:     Mental Status: She is alert.  Psychiatric:        Behavior: Behavior is cooperative.      UC Treatments / Results  Labs (all labs ordered are listed, but only abnormal results are displayed) Labs Reviewed - No data to display  EKG   Radiology No results found.  Procedures Procedures (including critical care time)  Medications Ordered in UC Medications  dexamethasone  (DECADRON ) injection 10 mg (10 mg Intramuscular Given 01/03/25 0947)    Initial Impression / Assessment and Plan / UC Course  I have reviewed the triage vital signs and the nursing notes.  Pertinent labs & imaging results that were available during my care of the patient were reviewed by me and considered in my medical decision making (see chart for details).     Patient is overall well-appearing.  Vitals are stable.  Exam findings appear to be consistent with contact dermatitis.  Given IM Decadron  in clinic.  Prescribed additional prednisone  burst for relief of rash.  Prescribed Zyrtec  to assist with itching and rash as well.  Discussed when  to use  Benadryl .  Provided patient with allergist information if needed.  Discussed follow-up, return, and strict ER precautions. Final Clinical Impressions(s) / UC Diagnoses   Final diagnoses:  Allergic contact dermatitis, unspecified trigger     Discharge Instructions      You were given an injection of Decadron  in clinic today to help with your allergic reaction type symptoms. Tomorrow start taking 2 tablets of prednisone  once daily for 5 days for additional relief of this. Take 1 tablet of Zyrtec  once daily to help with itching and rash as well. You can take 25 mg of Benadryl  every 6 hours as needed for additional relief of itching and rash. If you develop severe tongue swelling, throat swelling, difficulty breathing, or worsening rash please seek immediate medical treatment in the emergency department. I have attached information for an allergist that you can follow-up with if your symptoms continue to occur for further evaluation.    ED Prescriptions     Medication Sig Dispense Auth. Provider   predniSONE  (DELTASONE ) 20 MG tablet Take 2 tablets (40 mg total) by mouth daily for 5 days. 10 tablet [name] Flaming A, NP   cetirizine  (ZYRTEC  ALLERGY) 10 MG tablet Take 1 tablet (10 mg total) by mouth daily. 30 tablet [name] Flaming A, NP      PDMP not reviewed this encounter.    [1]  Social History Tobacco Use   Smoking status: Former    Types: Cigars   Smokeless tobacco: Never  Vaping Use   Vaping status: Never Used  Substance Use Topics   Alcohol use: No   Drug use: Not Currently    Types: Marijuana     [name] Flaming LABOR, NP 01/03/25 [phone]  "

## 2025-01-03 NOTE — Discharge Instructions (Addendum)
 You were given an injection of Decadron  in clinic today to help with your allergic reaction type symptoms. Tomorrow start taking 2 tablets of prednisone  once daily for 5 days for additional relief of this. Take 1 tablet of Zyrtec  once daily to help with itching and rash as well. You can take 25 mg of Benadryl  every 6 hours as needed for additional relief of itching and rash. If you develop severe tongue swelling, throat swelling, difficulty breathing, or worsening rash please seek immediate medical treatment in the emergency department. I have attached information for an allergist that you can follow-up with if your symptoms continue to occur for further evaluation.

## 2025-01-03 NOTE — ED Triage Notes (Signed)
 Patient states she developed a rash after taking a Lavender bath. Patient has a rash to her forearms, neck, and thighs.  Patient has been taking Benadryl  and the last dose was yesterday.

## 2025-01-13 ENCOUNTER — Emergency Department (HOSPITAL_COMMUNITY)
Admission: EM | Admit: 2025-01-13 | Discharge: 2025-01-13 | Disposition: A | Discharge status: Home / Self Care | Attending: Emergency Medicine | Admitting: Emergency Medicine

## 2025-01-13 ENCOUNTER — Other Ambulatory Visit: Payer: Self-pay

## 2025-01-13 ENCOUNTER — Encounter (HOSPITAL_COMMUNITY): Payer: Self-pay

## 2025-01-13 ENCOUNTER — Emergency Department (HOSPITAL_COMMUNITY)

## 2025-01-13 DIAGNOSIS — M25562 Pain in left knee: Secondary | ICD-10-CM | POA: Diagnosis present

## 2025-01-13 DIAGNOSIS — X501XXA Overexertion from prolonged static or awkward postures, initial encounter: Secondary | ICD-10-CM | POA: Diagnosis not present

## 2025-01-13 DIAGNOSIS — M2202 Recurrent dislocation of patella, left knee: Secondary | ICD-10-CM | POA: Diagnosis not present

## 2025-01-13 DIAGNOSIS — S83005A Unspecified dislocation of left patella, initial encounter: Secondary | ICD-10-CM

## 2025-01-13 MED ORDER — KETOROLAC TROMETHAMINE 60 MG/2ML IM SOLN
15.0000 mg | Freq: Once | INTRAMUSCULAR | Status: AC
  Administered 2025-01-13: 15 mg via INTRAMUSCULAR
  Filled 2025-01-13: qty 2

## 2025-01-13 MED ORDER — OXYCODONE HCL 5 MG PO TABS
5.0000 mg | ORAL_TABLET | Freq: Once | ORAL | Status: AC
  Administered 2025-01-13: 5 mg via ORAL
  Filled 2025-01-13: qty 1

## 2025-01-13 MED ORDER — ACETAMINOPHEN 500 MG PO TABS
1000.0000 mg | ORAL_TABLET | Freq: Once | ORAL | Status: AC
  Administered 2025-01-13: 1000 mg via ORAL
  Filled 2025-01-13: qty 2

## 2025-01-13 MED ORDER — OXYCODONE HCL 5 MG PO TABS: 5.0000 mg | ORAL_TABLET | Freq: Four times a day (QID) | ORAL | 0 refills | Status: AC | PRN

## 2025-01-13 NOTE — ED Provider Notes (Signed)
 " [place] [facility] AT [facility] Provider Note   CSN: [id number] Arrival date & time: 01/13/25  0746     History  Chief Complaint  Patient presents with   Knee Injury    Claudia Miller is a 34 y.o. female with PMH as listed below who presents bib EMS for L patellar dislocation this morning when she rolled over in bed.  She has had this before and the last one was a year ago. Same feeling as last patellar dislocation. Pedal pulse present.  She reports some altered sensation in the lateral left foot.. Per PCP probably needs surgery on knee . she has never seen an orthopedic doctor before had any surgery on the knee.  No falls or significant trauma today.  No pain in the thigh or hip.  Some referred pain down the lower leg but she states it is coming from the knee.  Cannot walk on the knee due to severe pain.  Otherwise had been in her normal state of health.  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 04/15/2020   Supervision of normal pregnancy, antepartum 07/12/2018    Nursing Staff Provider Office Location  [facility] Dating   US  6.4 on 06/04/18 Language  English  Anatomy US   Normal Flu Vaccine   08/15/18 Genetic Screen  NIPS: normal  TDaP vaccine   11/30/18 Hgb A1C or  GTT Early  Third trimester nl 2 hour  Rhogam   n/a   LAB RESULTS  Feeding Plan Bottle Blood Type A/Positive/-- (07/17 1211)  Contraception Depo Antibody Negative (07/17 1211) Circumcision  girl  Rubell   Trichomoniasis 08/09/2018   [x]  TOC       Home Medications Prior to Admission medications  Medication Sig Start Date End Date Taking? Authorizing Provider  oxyCODONE  (ROXICODONE ) 5 MG immediate release tablet Take 1 tablet (5 mg total) by mouth every 6 (six) hours as needed for up to 3 days for severe pain (pain score 7-10). 01/13/25 01/16/25 Yes Franklyn Sid SAILOR, MD  cetirizine  (ZYRTEC  ALLERGY) 10 MG tablet Take 1  tablet (10 mg total) by mouth daily. 01/03/25   [name] Flaming A, NP  ibuprofen  (ADVIL ) 800 MG tablet Take 1 tablet (800 mg total) by mouth every 8 (eight) hours as needed. 03/18/24   [name], CNM  Prenatal Vit-Fe Fumarate-FA (PRENATAL VITAMIN) 27-0.8 MG TABS Take 1 tablet by mouth daily. 08/18/23   [name], CNM      Allergies    Fish allergy    Review of Systems   Review of Systems A 10 point review of systems was performed and is negative unless otherwise reported in HPI.  Physical Exam Updated Vital Signs BP (!) 117/46 (BP Location: Right Arm)   Pulse 81   Temp 98.3 F (36.8 C) (Oral)   Resp 16   Ht 5' 1 (1.549 m)   Wt 96.2 kg   LMP 12/26/2024 (Approximate)   SpO2 100%   BMI 40.07 kg/m  Physical Exam General: Normal appearing female, lying in bed.  HEENT: NCAT, sclera anicteric, MMM, trachea midline.  Cardiology: RRR Resp: Normal respiratory rate and effort.  Abd: Soft, non-tender, non-distended. No rebound tenderness or guarding.  GU: Deferred. MSK: Patient with severe pain on palpation of the left knee in general, particularly the lateral and anterior regions.  She has no gross signs of trauma  although she has a mild knee effusion.  Patella seems to be in the correct anatomic alignment.  Tenderness over the quadriceps and patellar tendon.  Limited range of motion due to pain however she does have intact flexion and extension.  Pedal pulses palpated.  She reports some decrease sensation in the lateral aspect of the foot however has good cap refill and no distinct numbness.  No peripheral edema.  No tenderness of the thigh or hip.  Compartments soft.  No erythema, induration, fluctuance. Skin: warm, dry.  Neuro: A&Ox4, CNs II-XII grossly intact. MAEs. Sensation grossly decreased reported in the left lateral foot though still feels pain and light touch.  Psych: Normal mood and affect.   ED Results / Procedures / Treatments   Labs (all labs ordered are  listed, but only abnormal results are displayed) Labs Reviewed - No data to display  EKG None  Radiology DG Knee Left Port Result Date: 01/13/2025 EXAM: 1 OR 2 VIEW(S) XRAY OF THE KNEE 01/13/2025 08:15:00 AM COMPARISON: None available. CLINICAL HISTORY: Knee injury. FINDINGS: BONES AND JOINTS: No acute fracture. No malalignment. No significant joint effusion. No significant degenerative changes. SOFT TISSUES: Unremarkable. IMPRESSION: 1. No acute fracture or dislocation. Electronically signed by: Waddell Calk MD 01/13/2025 08:29 AM EST RP Workstation: [id number]    Procedures Procedures    Medications Ordered in ED Medications  ketorolac  (TORADOL ) injection 15 mg (has no administration in time range)  acetaminophen  (TYLENOL ) tablet 1,000 mg (has no administration in time range)  oxyCODONE  (Oxy IR/ROXICODONE ) immediate release tablet 5 mg (has no administration in time range)    ED Course/ Medical Decision Making/ A&P                          Medical Decision Making Amount and/or Complexity of Data Reviewed Radiology: ordered. Decision-making details documented in ED Course.  Risk OTC drugs. Prescription drug management.    This patient presents to the ED for concern of L knee , this involves an extensive number of treatment options, and is a complaint that carries with it a high risk of complications and morbidity.  I considered the following differential and admission for this acute, potentially limb threatening condition. Patient very well-appearing and HDS.   MDM:    Presentation most consistent with recurrent patellar dislocation that reduced prior to her arrival to the emergency department.  She does have some mild knee effusion but does not appear to have any joint instability.  Her knee range of motion is significantly limited due to pain but she does have intact flexion and extension.  Doubt tibial plateau fracture due to no trauma and very low mechanism.  No patellar  fracture or other knee dislocation or fracture noted on x-ray.  Doubt patellar tendon rupture or quadriceps tendon rupture.  Also consider possible knee sprain or other ligament or tendinous injury however again patient states this feels like her prior patellar dislocation and the mechanism was just rolling over in bed.  Doubt DVT, septic joint, gout.  I did consider neurovascular compromise as patient reports some numbness in the lateral aspect of the foot however she has subjective decrease sensation and does feel pain in that area, has good cap refill and good pulses, overall doubt any arterial compromise.  Recommended RICE, alternating Tylenol  and ibuprofen .  Given knee immobilizer and crutches.  Also given oxycodone  due to severe pain and instructed to not drive and take MiraLAX if she is taking it  frequently.  Patient advised to call orthopedic surgery tomorrow morning to make an appointment for within 1 week for follow-up and consideration for further intervention due to recurrent patellar dislocation especially with very low mechanism.  Patient given specific discharge instructions and return precautions, all questions answered to patient satisfaction.   Clinical Course as of 01/13/25 0843  Sun Jan 13, 2025  0838 DG Knee Left Port 1. No acute fracture or dislocation. [HN]    Clinical Course User Index [HN] Franklyn Sid SAILOR, MD    Imaging Studies ordered: I ordered imaging studies including left knee x-ray chart review I independently visualized and interpreted imaging. I agree with the radiologist interpretation  Additional history obtained from chart review, EMS.   Reevaluation: After the interventions noted above, I reevaluated the patient and found that they have :improved  Social Determinants of Health: Lives independently  Disposition:  DC w/ discharge instructions/return precautions. All questions answered to patient's satisfaction.    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 04/15/2020   Supervision of normal pregnancy, antepartum 07/12/2018    Nursing Staff Provider Office Location  [facility] Dating   US  6.4 on 06/04/18 Language  English  Anatomy US   Normal Flu Vaccine   08/15/18 Genetic Screen  NIPS: normal  TDaP vaccine   11/30/18 Hgb A1C or  GTT Early  Third trimester nl 2 hour  Rhogam   n/a   LAB RESULTS  Feeding Plan Bottle Blood Type A/Positive/-- (07/17 1211)  Contraception Depo Antibody Negative (07/17 1211) Circumcision  girl  Rubell   Trichomoniasis 08/09/2018   [x]  TOC     Medicines Meds ordered this encounter  Medications   ketorolac  (TORADOL ) injection 15 mg   acetaminophen  (TYLENOL ) tablet 1,000 mg   oxyCODONE  (Oxy IR/ROXICODONE ) immediate release tablet 5 mg    Refill:  0   oxyCODONE  (ROXICODONE ) 5 MG immediate release tablet    Sig: Take 1 tablet (5 mg total) by mouth every 6 (six) hours as needed for up to 3 days for severe pain (pain score 7-10).    Dispense:  12 tablet    Refill:  0    I have reviewed the patients home medicines and have made adjustments as needed  Problem List / ED Course: Problem List Items Addressed This Visit   None Visit Diagnoses       Acute pain of left knee    -  Primary     Dislocation of left patella, initial encounter                       This note was created using dictation software, which may contain spelling or grammatical errors.    Franklyn Sid SAILOR, MD 01/13/25 [phone]  "

## 2025-01-13 NOTE — Discharge Instructions (Addendum)
 Thank you for coming to [facility] Emergency Department. You were seen for acute knee pain and concern for recurrent patellar dislocation. It was no longer dislocated by the time you reached the ED. please wear a knee immobilizer at all times and utilize crutches to get around.  Please call the orthopedic surgery office for Dr. Germaine tomorrow morning to schedule appointment for within 1 week.  You can take Tylenol  1000 mg every 8 hours alternated with ibuprofen  600 mg every 8 hours for pain.  Please rest, ice, and elevate the extremity above your heart to help reduce pain and swelling.  For severe pain you can take oxycodone  5 mg every 4-6 hours.  If you are taking the oxycodone  frequently please take MiraLAX to help prevent constipation.  Please do not drive or operate machinery while taking the oxycodone  as it can cause drowsiness or dizziness.  Do not hesitate to return to the ED or call 911 if you experience: -Worsening symptoms -Cold, blue, numb foot -Recurrent dislocation  -Severe and rapidly worsening swelling -Lightheadedness, passing out -Fevers/chills -Anything else that concerns you

## 2025-01-13 NOTE — ED Triage Notes (Signed)
 Pt bib EMS for L knee dislocation this morning last one was a year ago. Same feeling as last knee dislocation. Pedal pulse present. Foot numbness reported. Per PCP probably needs surgery on knee .118/74 BP 80 HR 98% RA 16 RR.

## 2025-01-13 NOTE — Progress Notes (Signed)
 Orthopedic Tech Progress Note Patient Details:  Claudia Miller [birth year] [id number]  Ortho Devices Type of Ortho Device: Crutches, Knee Immobilizer Ortho Device/Splint Location: LLE Ortho Device/Splint Interventions: Application   Post Interventions Patient Tolerated: Well  [name] Claudia Miller 01/13/2025, 10:44 AM
